# Patient Record
Sex: Female | Born: 1960
Health system: Southern US, Community
[De-identification: ages and names within clinical notes are randomized; demographics above are authoritative.]

## PROBLEM LIST (undated history)

## (undated) DIAGNOSIS — D509 Iron deficiency anemia, unspecified: Secondary | ICD-10-CM

## (undated) DIAGNOSIS — M7072 Other bursitis of hip, left hip: Secondary | ICD-10-CM

## (undated) DIAGNOSIS — Z860100 Personal history of colon polyps, unspecified: Secondary | ICD-10-CM

## (undated) DIAGNOSIS — M1990 Unspecified osteoarthritis, unspecified site: Secondary | ICD-10-CM

## (undated) DIAGNOSIS — K209 Esophagitis, unspecified without bleeding: Secondary | ICD-10-CM

## (undated) DIAGNOSIS — I10 Essential (primary) hypertension: Secondary | ICD-10-CM

## (undated) DIAGNOSIS — E785 Hyperlipidemia, unspecified: Secondary | ICD-10-CM

## (undated) DIAGNOSIS — K649 Unspecified hemorrhoids: Secondary | ICD-10-CM

## (undated) DIAGNOSIS — I639 Cerebral infarction, unspecified: Secondary | ICD-10-CM

## (undated) DIAGNOSIS — I773 Arterial fibromuscular dysplasia: Secondary | ICD-10-CM

## (undated) DIAGNOSIS — G459 Transient cerebral ischemic attack, unspecified: Secondary | ICD-10-CM

## (undated) DIAGNOSIS — R1310 Dysphagia, unspecified: Secondary | ICD-10-CM

## (undated) DIAGNOSIS — K219 Gastro-esophageal reflux disease without esophagitis: Secondary | ICD-10-CM

## (undated) HISTORY — DX: Esophagitis, unspecified: K20.9

## (undated) HISTORY — DX: Gastro-esophageal reflux disease without esophagitis: K21.9

## (undated) HISTORY — DX: Personal history of colonic polyps: Z86.010

## (undated) HISTORY — DX: Essential (primary) hypertension: I10

## (undated) HISTORY — DX: Esophagitis, unspecified without bleeding: K20.90

## (undated) HISTORY — DX: Cerebral infarction, unspecified: I63.9

## (undated) HISTORY — DX: Dysphagia, unspecified: R13.10

## (undated) HISTORY — DX: Personal history of colon polyps, unspecified: Z86.0100

## (undated) HISTORY — PX: OTHER SURGICAL HISTORY: SHX169

## (undated) HISTORY — DX: Iron deficiency anemia, unspecified: D50.9

## (undated) HISTORY — DX: Hyperlipidemia, unspecified: E78.5

## (undated) HISTORY — DX: Unspecified osteoarthritis, unspecified site: M19.90

## (undated) HISTORY — DX: Unspecified hemorrhoids: K64.9

## (undated) HISTORY — DX: Transient cerebral ischemic attack, unspecified: G45.9

## (undated) HISTORY — DX: Other bursitis of hip, left hip: M70.72

---

## 1987-08-04 HISTORY — PX: TEMPOROMANDIBULAR JOINT SURGERY: SHX35

## 1998-08-07 ENCOUNTER — Ambulatory Visit (HOSPITAL_COMMUNITY): Admission: RE | Admit: 1998-08-07 | Discharge: 1998-08-07 | Payer: Self-pay | Admitting: Radiation Oncology

## 1998-08-30 ENCOUNTER — Ambulatory Visit (HOSPITAL_COMMUNITY): Admission: RE | Admit: 1998-08-30 | Discharge: 1998-08-30 | Payer: Self-pay | Admitting: Allergy

## 1998-09-03 ENCOUNTER — Ambulatory Visit (HOSPITAL_COMMUNITY): Admission: RE | Admit: 1998-09-03 | Discharge: 1998-09-03 | Payer: Self-pay | Admitting: Radiation Oncology

## 1999-02-28 ENCOUNTER — Ambulatory Visit (HOSPITAL_COMMUNITY): Admission: RE | Admit: 1999-02-28 | Discharge: 1999-02-28 | Payer: Self-pay | Admitting: Radiation Oncology

## 1999-07-16 ENCOUNTER — Encounter: Payer: Self-pay | Admitting: *Deleted

## 1999-07-16 ENCOUNTER — Ambulatory Visit (HOSPITAL_COMMUNITY): Admission: RE | Admit: 1999-07-16 | Discharge: 1999-07-16 | Payer: Self-pay | Admitting: *Deleted

## 1999-08-22 ENCOUNTER — Ambulatory Visit (HOSPITAL_COMMUNITY): Admission: RE | Admit: 1999-08-22 | Discharge: 1999-08-22 | Payer: Self-pay | Admitting: Orthopaedic Surgery

## 1999-08-22 ENCOUNTER — Encounter: Payer: Self-pay | Admitting: Orthopaedic Surgery

## 2000-02-10 ENCOUNTER — Ambulatory Visit (HOSPITAL_COMMUNITY): Admission: RE | Admit: 2000-02-10 | Discharge: 2000-02-10 | Payer: Self-pay | Admitting: Radiation Oncology

## 2002-04-10 ENCOUNTER — Other Ambulatory Visit: Admission: RE | Admit: 2002-04-10 | Discharge: 2002-04-10 | Payer: Self-pay | Admitting: Obstetrics and Gynecology

## 2002-05-26 ENCOUNTER — Encounter (INDEPENDENT_AMBULATORY_CARE_PROVIDER_SITE_OTHER): Payer: Self-pay

## 2002-05-26 ENCOUNTER — Ambulatory Visit (HOSPITAL_COMMUNITY): Admission: RE | Admit: 2002-05-26 | Discharge: 2002-05-26 | Payer: Self-pay | Admitting: Obstetrics and Gynecology

## 2002-08-22 ENCOUNTER — Encounter: Payer: Self-pay | Admitting: Obstetrics and Gynecology

## 2002-08-22 ENCOUNTER — Encounter: Admission: RE | Admit: 2002-08-22 | Discharge: 2002-08-22 | Payer: Self-pay | Admitting: Obstetrics and Gynecology

## 2002-09-27 ENCOUNTER — Encounter: Admission: RE | Admit: 2002-09-27 | Discharge: 2002-09-27 | Payer: Self-pay | Admitting: Emergency Medicine

## 2002-09-27 ENCOUNTER — Encounter: Payer: Self-pay | Admitting: Emergency Medicine

## 2002-10-06 ENCOUNTER — Ambulatory Visit (HOSPITAL_COMMUNITY): Admission: RE | Admit: 2002-10-06 | Discharge: 2002-10-06 | Payer: Self-pay | Admitting: Emergency Medicine

## 2002-10-09 ENCOUNTER — Ambulatory Visit (HOSPITAL_COMMUNITY): Admission: RE | Admit: 2002-10-09 | Discharge: 2002-10-09 | Payer: Self-pay | Admitting: Emergency Medicine

## 2002-10-09 ENCOUNTER — Encounter: Payer: Self-pay | Admitting: Emergency Medicine

## 2003-01-31 ENCOUNTER — Encounter: Admission: RE | Admit: 2003-01-31 | Discharge: 2003-01-31 | Payer: Self-pay | Admitting: Emergency Medicine

## 2003-01-31 ENCOUNTER — Encounter: Payer: Self-pay | Admitting: Emergency Medicine

## 2003-10-31 ENCOUNTER — Encounter: Admission: RE | Admit: 2003-10-31 | Discharge: 2003-10-31 | Payer: Self-pay | Admitting: Emergency Medicine

## 2003-11-11 ENCOUNTER — Encounter: Admission: RE | Admit: 2003-11-11 | Discharge: 2003-11-11 | Payer: Self-pay | Admitting: Emergency Medicine

## 2003-11-16 ENCOUNTER — Encounter: Admission: RE | Admit: 2003-11-16 | Discharge: 2003-11-16 | Payer: Self-pay | Admitting: Obstetrics and Gynecology

## 2003-11-22 ENCOUNTER — Other Ambulatory Visit: Admission: RE | Admit: 2003-11-22 | Discharge: 2003-11-22 | Payer: Self-pay | Admitting: Obstetrics and Gynecology

## 2004-04-17 ENCOUNTER — Ambulatory Visit (HOSPITAL_COMMUNITY): Admission: RE | Admit: 2004-04-17 | Discharge: 2004-04-17 | Payer: Self-pay | Admitting: *Deleted

## 2004-08-26 ENCOUNTER — Ambulatory Visit: Payer: Self-pay | Admitting: Gastroenterology

## 2004-09-15 ENCOUNTER — Ambulatory Visit: Payer: Self-pay | Admitting: Gastroenterology

## 2004-12-03 ENCOUNTER — Ambulatory Visit: Payer: Self-pay | Admitting: Gastroenterology

## 2004-12-11 ENCOUNTER — Encounter: Admission: RE | Admit: 2004-12-11 | Discharge: 2004-12-11 | Payer: Self-pay | Admitting: Obstetrics and Gynecology

## 2005-01-23 ENCOUNTER — Other Ambulatory Visit: Admission: RE | Admit: 2005-01-23 | Discharge: 2005-01-23 | Payer: Self-pay | Admitting: Obstetrics and Gynecology

## 2005-04-12 ENCOUNTER — Ambulatory Visit (HOSPITAL_COMMUNITY): Admission: RE | Admit: 2005-04-12 | Discharge: 2005-04-12 | Payer: Self-pay | Admitting: *Deleted

## 2005-10-17 ENCOUNTER — Ambulatory Visit (HOSPITAL_COMMUNITY): Admission: RE | Admit: 2005-10-17 | Discharge: 2005-10-17 | Payer: Self-pay | Admitting: *Deleted

## 2005-12-14 ENCOUNTER — Encounter: Admission: RE | Admit: 2005-12-14 | Discharge: 2005-12-14 | Payer: Self-pay | Admitting: Obstetrics and Gynecology

## 2006-05-14 ENCOUNTER — Other Ambulatory Visit: Admission: RE | Admit: 2006-05-14 | Discharge: 2006-05-14 | Payer: Self-pay | Admitting: Obstetrics and Gynecology

## 2006-07-29 ENCOUNTER — Ambulatory Visit (HOSPITAL_COMMUNITY): Admission: RE | Admit: 2006-07-29 | Discharge: 2006-07-29 | Payer: Self-pay | Admitting: *Deleted

## 2006-10-14 ENCOUNTER — Ambulatory Visit (HOSPITAL_COMMUNITY): Admission: RE | Admit: 2006-10-14 | Discharge: 2006-10-14 | Payer: Self-pay | Admitting: Neurology

## 2007-02-07 ENCOUNTER — Ambulatory Visit (HOSPITAL_COMMUNITY): Admission: RE | Admit: 2007-02-07 | Discharge: 2007-02-07 | Payer: Self-pay | Admitting: Neurology

## 2007-02-15 ENCOUNTER — Ambulatory Visit: Payer: Self-pay | Admitting: *Deleted

## 2007-02-17 ENCOUNTER — Ambulatory Visit: Payer: Self-pay | Admitting: *Deleted

## 2007-04-13 ENCOUNTER — Encounter: Admission: RE | Admit: 2007-04-13 | Discharge: 2007-04-13 | Payer: Self-pay | Admitting: Obstetrics and Gynecology

## 2007-07-08 ENCOUNTER — Other Ambulatory Visit: Admission: RE | Admit: 2007-07-08 | Discharge: 2007-07-08 | Payer: Self-pay | Admitting: Obstetrics and Gynecology

## 2007-08-18 ENCOUNTER — Ambulatory Visit: Payer: Self-pay | Admitting: *Deleted

## 2007-09-27 ENCOUNTER — Emergency Department (HOSPITAL_COMMUNITY): Admission: EM | Admit: 2007-09-27 | Discharge: 2007-09-27 | Payer: Self-pay | Admitting: Emergency Medicine

## 2008-02-22 ENCOUNTER — Ambulatory Visit (HOSPITAL_COMMUNITY): Admission: RE | Admit: 2008-02-22 | Discharge: 2008-02-22 | Payer: Self-pay | Admitting: *Deleted

## 2008-02-23 ENCOUNTER — Ambulatory Visit: Payer: Self-pay | Admitting: *Deleted

## 2008-07-10 ENCOUNTER — Encounter: Admission: RE | Admit: 2008-07-10 | Discharge: 2008-07-10 | Payer: Self-pay | Admitting: Obstetrics and Gynecology

## 2008-08-02 ENCOUNTER — Ambulatory Visit (HOSPITAL_COMMUNITY): Admission: RE | Admit: 2008-08-02 | Discharge: 2008-08-02 | Payer: Self-pay | Admitting: *Deleted

## 2008-08-09 ENCOUNTER — Ambulatory Visit: Payer: Self-pay | Admitting: *Deleted

## 2009-02-26 ENCOUNTER — Ambulatory Visit (HOSPITAL_COMMUNITY): Admission: RE | Admit: 2009-02-26 | Discharge: 2009-02-26 | Payer: Self-pay | Admitting: *Deleted

## 2009-02-28 ENCOUNTER — Ambulatory Visit: Payer: Self-pay | Admitting: *Deleted

## 2009-08-14 ENCOUNTER — Encounter (INDEPENDENT_AMBULATORY_CARE_PROVIDER_SITE_OTHER): Payer: Self-pay | Admitting: *Deleted

## 2009-11-11 ENCOUNTER — Ambulatory Visit: Payer: Self-pay | Admitting: Gastroenterology

## 2009-11-11 ENCOUNTER — Encounter (INDEPENDENT_AMBULATORY_CARE_PROVIDER_SITE_OTHER): Payer: Self-pay | Admitting: *Deleted

## 2009-11-11 DIAGNOSIS — K219 Gastro-esophageal reflux disease without esophagitis: Secondary | ICD-10-CM

## 2009-11-11 DIAGNOSIS — Z860100 Personal history of colon polyps, unspecified: Secondary | ICD-10-CM | POA: Insufficient documentation

## 2009-11-11 DIAGNOSIS — R1319 Other dysphagia: Secondary | ICD-10-CM

## 2009-11-12 LAB — CONVERTED CEMR LAB
Eosinophils Relative: 1.1 % (ref 0.0–5.0)
Ferritin: 4.6 ng/mL — ABNORMAL LOW (ref 10.0–291.0)
HCT: 26.1 % — ABNORMAL LOW (ref 36.0–46.0)
Iron: 10 ug/dL — ABNORMAL LOW (ref 42–145)
Lymphs Abs: 1.3 10*3/uL (ref 0.7–4.0)
MCV: 65.3 fL — ABNORMAL LOW (ref 78.0–100.0)
Monocytes Relative: 8.1 % (ref 3.0–12.0)
Neutro Abs: 4.2 10*3/uL (ref 1.4–7.7)
Neutrophils Relative %: 69.1 % (ref 43.0–77.0)
RBC: 4 M/uL (ref 3.87–5.11)
RDW: 23.1 % — ABNORMAL HIGH (ref 11.5–14.6)
Saturation Ratios: 2 % — ABNORMAL LOW (ref 20.0–50.0)
Transferrin: 361.6 mg/dL — ABNORMAL HIGH (ref 212.0–360.0)
Vitamin B-12: 596 pg/mL (ref 211–911)
WBC: 6.2 10*3/uL (ref 4.5–10.5)

## 2010-01-03 ENCOUNTER — Ambulatory Visit: Payer: Self-pay | Admitting: Gastroenterology

## 2010-02-26 ENCOUNTER — Ambulatory Visit (HOSPITAL_COMMUNITY): Admission: RE | Admit: 2010-02-26 | Discharge: 2010-02-26 | Payer: Self-pay | Admitting: Surgery

## 2010-03-03 ENCOUNTER — Ambulatory Visit: Payer: Self-pay | Admitting: Surgery

## 2010-03-19 ENCOUNTER — Ambulatory Visit (HOSPITAL_COMMUNITY): Admission: RE | Admit: 2010-03-19 | Discharge: 2010-03-19 | Payer: Self-pay | Admitting: Neurology

## 2010-03-21 ENCOUNTER — Encounter: Admission: RE | Admit: 2010-03-21 | Discharge: 2010-03-21 | Payer: Self-pay | Admitting: Obstetrics and Gynecology

## 2010-04-15 ENCOUNTER — Ambulatory Visit: Payer: Self-pay | Admitting: Internal Medicine

## 2010-04-15 DIAGNOSIS — M76899 Other specified enthesopathies of unspecified lower limb, excluding foot: Secondary | ICD-10-CM

## 2010-08-24 ENCOUNTER — Encounter: Payer: Self-pay | Admitting: *Deleted

## 2010-08-25 ENCOUNTER — Encounter: Payer: Self-pay | Admitting: *Deleted

## 2010-09-02 NOTE — Procedures (Signed)
Summary: Colonoscopy   Colonoscopy  Procedure date:  09/15/2004  Findings:      Results: Hemorrhoids.     Pathology:  Adenomatous polyp.        Location:  [facility].    Procedures Next Due Date:    Colonoscopy: 09/2009 Patient Name: Cheryl [name], Cheryl [name] MRN:  Procedure Procedures: Colonoscopy CPT: [address].    with polypectomy. CPT: A3573898.  Personnel: Endoscopist: [name]. Russella Dar, MD, Clementeen Graham.  Referred By: Sylvester Harder, MD.  Exam Location: Exam performed in Outpatient Clinic. Outpatient  Patient Consent: Procedure, Alternatives, Risks and Benefits discussed, consent obtained, from patient. Consent was obtained by the RN.  Indications  Evaluation of: Anemia with low iron saturation.  History  Current Medications: Patient is not currently taking Coumadin.  Pre-Exam Physical: Performed Sep 15, 2004. Entire physical exam was normal.  Exam Exam: Extent of exam reached: Terminal Ileum, extent intended: Terminal Ileum.  The cecum was identified by appendiceal orifice and IC valve. Colon retroflexion performed. ASA Classification: II. Tolerance: excellent.  Monitoring: Pulse and BP monitoring, Oximetry used. Supplemental O2 given.  Colon Prep Used Miralax for colon prep. Prep results: good.  Sedation Meds: Patient assessed and found to be appropriate for moderate (conscious) sedation. Fentanyl 75 mcg. given IV. Versed 8 mg. given IV.  Findings NORMAL EXAM: Terminal Ileum to Sigmoid Colon.  POLYP: Rectum, Maximum size: 3 mm. sessile polyp. Procedure:  snare without cautery, removed, retrieved, Polyp sent to pathology. ICD9: Colon Polyps: 211.3.   Assessment  Diagnoses: 211.3: Colon Polyps.  455.3: Hemorrhoids, External.   Events  Unplanned Interventions: No intervention was required.  Unplanned Events: There were no complications. Plans Medication Plan: Continue current medications.  Patient Education: Patient given standard instructions  for: Polyps. Hemorrhoids.  Disposition: After procedure patient sent to recovery. After recovery patient sent home.  Scheduling/Referral: Colonoscopy, to [facility] T. Russella Dar, MD, [facility], if polyp is adenomatous, around Sep 15, 2009.  Referring provider, to Sylvester Harder, MD,    This report was created from the original endoscopy report, which was reviewed and signed by the above listed endoscopist.    cc: Sylvester Harder, MD

## 2010-09-02 NOTE — Letter (Signed)
Summary: Colonoscopy Letter  [facility]  [address] [place], [place] [address]   Phone: [phone]  Fax: [phone]      August 14, 2009 MRN: [id number]   [facility] [address] [place], [place]  [address]   Dear Ms. Kithcart,   According to your medical record, it is time for you to schedule a Colonoscopy. The American Cancer Society recommends this procedure as a method to detect early colon cancer. Patients with a family history of colon cancer, or a personal history of colon polyps or inflammatory bowel disease are at increased risk.  This letter has beeen generated based on the recommendations made at the time of your procedure. If you feel that in your particular situation this may no longer apply, please contact our office.  Please call our office at [phone] to schedule this appointment or to update your records at your earliest convenience.  Thank you for cooperating with [place] to provide you with the very best care possible.   Sincerely,  Judie Petit T. Russella Dar, M.D.  [facility] [phone]

## 2010-09-02 NOTE — Assessment & Plan Note (Signed)
Summary: GERD/RECALL COLON   History of Present Illness Visit Type: Initial Visit Primary GI MD: Elie Goody MD [facility] Primary Provider: None Chief Complaint: Patient c/o worsening GERD with increased bloating and fullness. She also c/o dysphagia at times. History of Present Illness:   This is a 50 year old female, who relates worsening problems with reflux, globus sensation, epigastric bloating and fullness, and intermittent solid food dysphagia. She takes Nexium either once or twice a day depending on her symptoms. She is concerned that taking Nexium twice a day is exacerbating her restless leg syndrome, and possibly leading to B12 deficiency.   GI Review of Systems    Reports acid reflux, bloating, chest pain, dysphagia with solids, heartburn, loss of appetite, and  nausea.      Denies abdominal pain, belching, dysphagia with liquids, vomiting, vomiting blood, weight loss, and  weight gain.      Reports hemorrhoids.     Denies anal fissure, black tarry stools, change in bowel habit, constipation, diarrhea, diverticulosis, fecal incontinence, heme positive stool, irritable bowel syndrome, jaundice, light color stool, liver problems, rectal bleeding, and  rectal pain. Preventive Screening-Counseling & Management  Caffeine-Diet-Exercise     Does Patient Exercise: no   Current Medications (verified): 1)  Nexium 40 Mg Cpdr (Esomeprazole Magnesium) .... Take 1 Tablet Once Daily Up To 2 Times Daily 2)  Aspirin 81 Mg Tbec (Aspirin) .... Take 1 Tablet By Mouth Once A Day 3)  Zyrtec Allergy 10 Mg Tabs (Cetirizine Hcl) .... Take 1 Tablet By Mouth As Needed 4)  Advil Allergy Sinus 2-30-200 Mg Tabs (Chlorpheniramine-Pse-Ibuprofen) .... Take As Needed For Sinuses  Allergies (verified): 1)  ! Sulfa 2)  ! Ancef  Past History:  Past Medical History: Reviewed history from 11/06/2009 and no changes required. 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  Alcohol Use - no Illicit Drug Use - no Patient does not get regular exercise.  Does Patient Exercise:  no  Review of Systems       The patient complains of allergy/sinus, arthritis/joint pain, heart rhythm changes, and menstrual pain.         The pertinent positives and negatives are noted as above and in the HPI. All other ROS were reviewed and were negative.   Vital Signs:  Patient profile:   50 year old female Height:      60 inches Weight:      106.13 pounds BMI:     20.80 BSA:     1.43 Pulse rate:   92 / minute Pulse rhythm:   regular BP sitting:   120 / 88  (left arm)  Vitals Entered By: Hortense Ramal CMA Duncan Dull) (November 11, 2009 9:27 AM)  Physical Exam  General:  Well developed, well nourished, no acute distress. Head:  Normocephalic and atraumatic. Eyes:  PERRLA, no icterus. Ears:  Normal auditory acuity. Mouth:  No deformity or lesions, dentition normal. Neck:  Supple; no masses or thyromegaly. Chest Wall:  Symmetrical;  no deformities or tenderness. Lungs:  Clear throughout to auscultation. Heart:  Regular rate and rhythm; no murmurs, rubs,  or bruits. Abdomen:  Soft, nontender and  nondistended. No masses, hepatosplenomegaly or hernias noted. Normal bowel sounds. Rectal:  Normal exam. Msk:  Symmetrical with no gross deformities. Normal posture. Pulses:  Normal pulses noted. Extremities:  No clubbing, cyanosis, edema or deformities noted. Neurologic:  Alert  and  oriented x4;  grossly normal neurologically. Skin:  Intact without significant lesions or rashes. Cervical Nodes:  No significant cervical adenopathy. Axillary Nodes:  No significant axillary adenopathy. Inguinal Nodes:  No significant inguinal adenopathy. Psych:  Alert and cooperative. Normal mood and affect.  Impression & Recommendations:  Problem # 1:  GERD (ICD-530.81) GERD with solid food dysphasia. Rule out peptic strictures and esophagitis. Change to Dexilant 60 mg q.a.m. Evaluate for recurrent anemia given restless leg syndrome, and prior history of iron deficiency. Orders: TLB-B12, Serum-Total ONLY (82607-B12) TLB-Iron, (Fe) Total (83540-FE) TLB-Ferritin (82728-FER) TLB-IBC Pnl (Iron/FE;Transferrin) (83550-IBC) TLB-CBC Platelet - w/Differential (85025-CBCD) Endo Savary  Dil/Colon (Endo Sav Dil/Col)  Problem # 2:  OTHER DYSPHAGIA (ZOX-096.04) See problem #1.  Problem # 3:  COLONIC POLYPS, ADENOMATOUS, HX OF (ICD-V12.72) Prior adenomatous colon polyps, initially diagnosed in February 2006. She is due for surveillance. The risks, benefits and alternatives to colonoscopy with possible biopsy and possible polypectomy were discussed with the patient and they consent to proceed. The procedure will be scheduled electively. Orders: Endo Savary  Dil/Colon (Endo Sav Dil/Col)  Patient Instructions: 1)  Get your labs drawn today in the basement.  2)  Colonoscopy brochure given.  3)  Upper Endoscopy brochure given.  4)  Start Dexilant 60mg  samples one tablet by mouth once daily and HOLD Nexium while taking this medication. 5)  Avoid foods high in acid content ( tomatoes, citrus juices, spicy foods) . Avoid eating within 3 to 4 hours of lying down or before exercising. Do not over eat; try smaller more frequent meals. Elevate head of bed four inches when sleeping.  6)  The medication list was reviewed and reconciled.  All changed / newly prescribed medications were explained.  A  complete medication list was provided to the patient / caregiver.  Prescriptions: MOVIPREP 100 GM  SOLR (PEG-KCL-NACL-NASULF-NA ASC-C) As per prep instructions.  #1 x 0   Entered by:   Christie Nottingham CMA (AAMA)   Authorized by:   Meryl Dare MD [facility]   Signed by:   Meryl Dare MD FACG on 11/11/2009   Method used:   Electronically to        [facility] [phone]* (retail)       [address]       [place], [place]  [id number]       Ph: [phone] or [phone]       Fax: [phone]   RxID:   [id number]

## 2010-09-02 NOTE — Procedures (Signed)
Summary: Upper Endoscopy w/DIL  Patient: Cheryl [name] Note: All result statuses are Final unless otherwise noted.  Tests: (1) Upper Endoscopy w/DIL (UED)  UED Upper Endoscopy w/DIL                             DONE     [facility]     [address]     [place], [place]  [address]           ENDOSCOPY PROCEDURE REPORT           PATIENT:  Cheryl, [name]  MR#:  [id number]     BIRTHDATE:  [birth year], 48 yrs. old  GENDER:  female     ENDOSCOPIST:  Judie Petit T. Russella Dar, MD, [facility]           PROCEDURE DATE:  01/03/2010     PROCEDURE:  EGD with dilatation over guidewire     ASA CLASS:  Class I     INDICATIONS:  1) dysphagia  2) GERD     MEDICATIONS:  There was residual sedation effect present from     prior procedure.     TOPICAL ANESTHETIC:  none     DESCRIPTION OF PROCEDURE:   After the risks benefits and     alternatives of the procedure were thoroughly explained, informed     consent was obtained.  The [id number]-[id number] [id number] endoscope was     introduced through the mouth and advanced to the second portion of     the duodenum, without limitations.  The instrument was slowly     withdrawn as the mucosa was carefully examined.     <<PROCEDUREIMAGES>>     The esophagus and gastroesophageal junction were completely normal     in appearance.  The stomach was entered and closely examined. The     pylorus, antrum, angularis, and lesser curvature were well     visualized, including a retroflexed view of the cardia and fundus.     The stomach wall was normally distensable. The scope passed easily     through the pylorus into the duodenum. The duodenal bulb was     normal in appearance, as was the postbulbar duodenum. Dilation was     then performed at the total esophagus for dysphagia without a     stricture:           1) Dilator:  Savary over guidewire  Size:  17 mm     Resistance:  none  Heme:  none           COMPLICATIONS:  None           ENDOSCOPIC IMPRESSION:     1) Normal  EGD           RECOMMENDATIONS:     1) anti-reflux regimen     2) continue PPI     3) follow-up: GI clinic 1 year     4) post dilation instructions           Shaakira Borrero T. Russella Dar, MD, [facility]           n.     eSIGNED:   [name]. Luverne Zerkle at 01/03/2010 04:42 PM           [name], [id number]  Note: An exclamation mark (!) indicates a result that was not dispersed into the flowsheet. Document Creation Date: 01/03/2010 4:42 PM _______________________________________________________________________  (1) Order result status: Final Collection or observation date-time:  01/03/2010 16:39 Requested date-time:  Receipt date-time:  Reported date-time:  Referring Physician:   Ordering Physician: Claudette Head [phone]) Specimen Source:  Source: Launa Grill Order Number: [id number] Lab site:

## 2010-09-02 NOTE — Assessment & Plan Note (Signed)
Summary: Gastroenterology  FRANCE LUSTY MR#:  [id number] Page #  NAME:  Cheryl, [name]  OFFICE NO:  [id number]  DATE:  08/26/04  DOB:  07/21/2061  REFERRING PHYSICIAN:  Dr. Sylvester Harder  REASON FOR REFERRAL:  Refractory iron-deficiency anemia.  HISTORY OF PRESENT ILLNESS:  The patient is a very nice 50 year old African American female with a history of iron-deficiency anemia dating back to September 2003. She has been on and off iron supplements since that time. Recent hemoglobin was 9 with a serum iron of 12 and a total iron binding capacity of 459. She has regular heavy menstrual periods, which are felt to be the etiology of her refractory iron-deficiency anemia. Hysterectomy is under consideration at this time.   She has had intermittent problems with reflux symptoms and regurgitation. She has undergone 2 upper GI series, 1 was in about 2000 that she relates was negative, and 1 was performed in June 2004, which I was able to obtain and which was entirely normal. She has had problems with chronic constipation, but she eats raw oatmeal, and this has substantially helped her chronic constipation. She does have a substantial craving for raw oatmeal ever since she has become iron deficient. She has no dysphagia, odynophagia, anorexia, weight loss, nausea, vomiting, abdominal pain, diarrhea, constipation, melena, hematochezia, change in stool caliber, fevers, or chills. There is no family history of colon cancer, colon polyps, or inflammatory bowel disease. She does note occasional perianal itching and stinging with a slight bulging sensation that she relates to a hemorrhoid. She has taken Mylanta Gas on a p.r.n. basis, which does help when she has a flare of her reflux symptoms. She has not previously had colonoscopy or upper endoscopy.  PAST MEDICAL HISTORY:  Hyperlipidemia, allergies, iron-deficiency anemia, metromenorrhagia, gastroesophageal reflux disease.  SOCIAL HISTORY:  She is married with 2  children. 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 She denies any tobacco product or alcohol product usage.  CURRENT MEDICATIONS:  Nexium 40 mg p.o. q.d., Feosol q.d., multivitamin q.d., calcium 600 plus vitamin D q.d., Flonase p.r.n., decongestant p.r.n., Advil p.r.n., Reglan p.r.n.   MEDICATION ALLERGIES:  Sulfa drugs and Ancef.  REVIEW OF SYSTEMS:  Several areas positive as per the handwritten form including a history of a tickle in her throat and occasional mild hoarseness and voice fatigue.  PHYSICAL EXAMINATION:  No acute distress. Height 5 feet 3/4 inch. Weight 109 pounds. Blood pressure 132/70. Pulse 74 and regular. HEENT exam:  Anicteric sclerae; oropharynx clear.  Chest:  Clear to auscultation and percussion.  Cardiac:  Regular rate and rhythm without murmurs.  Abdomen:  Soft, nontender, and nondistended with normoactive bowel sounds; no palpable organomegaly, masses, or hernias.  Rectal examination deferred to time of colonoscopy.  Extremities:  Without clubbing, cyanosis, or edema.  Neurologic:  Alert and oriented x 3. Grossly nonfocal.  ASSESSMENT AND PLAN:   1.  Refractory iron-deficiency anemia. Rule out occult gastrointestinal losses from polyps, vascular malformations, ulcers, and other disorders. Her refractory anemia is most likely secondary to her metromenorrhagia. Will obtain stool Hemoccults. Risks, benefits, and alternatives to colonoscopy with possible biopsy and possible polypectomy and upper endoscopy with possible biopsy were discussed with the patient, and she consents to proceed. The procedures will be scheduled electively. 2.  Gastroesophageal reflux disease. Reasonably good symptomatic control. However, she does have some symptoms suggestive of laryngopharyngeal reflux. These symptoms may also be related to her allergies. Will increase Nexium to 40 mg p.o. b.i.d.  and re-intensify all standard antireflux measures. The Nexium is to be taken 30 to 45  minutes before breakfast and dinner.   Return office visit with me in 3 to 4 months.      [name]. Russella Dar, M.D., F.A.C.G.  [id number] cc:  Dr. Sylvester Harder D:  08/26/04; T:  ; Job [id number]

## 2010-09-02 NOTE — Letter (Signed)
Summary: [facility] Instructions  [facility]  [address] [place], [place] [address]   Phone: [phone]  Fax: [phone]       Cheryl [name]    [birth year]    MRN: [id number]        Procedure Day /Date: Friday May 13th, 2011     Arrival Time: 1:00pm     Procedure Time: 2:00pm     Location of Procedure:                    _x _  [facility] (4th Floor)                        PREPARATION FOR COLONOSCOPY WITH MOVIPREP   Starting 5 days prior to your procedure 12/08/09 do not eat nuts, seeds, popcorn, corn, beans, peas,  salads, or any raw vegetables.  Do not take any fiber supplements (e.g. Metamucil, Citrucel, and Benefiber).  THE DAY BEFORE YOUR PROCEDURE         DATE: 12/12/09  DAY: Thursday  1.  Drink clear liquids the entire day-NO SOLID FOOD  2.  Do not drink anything colored red or purple.  Avoid juices with pulp.  No orange juice.  3.  Drink at least 64 oz. (8 glasses) of fluid/clear liquids during the day to prevent dehydration and help the prep work efficiently.  CLEAR LIQUIDS INCLUDE: Water Jello Ice Popsicles Tea (sugar ok, no milk/cream) Powdered fruit flavored drinks Coffee (sugar ok, no milk/cream) Gatorade Juice: apple, white grape, white cranberry  Lemonade Clear bullion, consomm, broth Carbonated beverages (any kind) Strained chicken noodle soup Hard Candy                             4.  In the morning, mix first dose of MoviPrep solution:    Empty 1 Pouch A and 1 Pouch B into the disposable container    Add lukewarm drinking water to the top line of the container. Mix to dissolve    Refrigerate (mixed solution should be used within 24 hrs)  5.  Begin drinking the prep at 5:00 p.m. The MoviPrep container is divided by 4 marks.   Every 15 minutes drink the solution down to the next mark (approximately 8 oz) until the full liter is complete.   6.  Follow completed prep with 16 oz of clear liquid of your choice  (Nothing red or purple).  Continue to drink clear liquids until bedtime.  7.  Before going to bed, mix second dose of MoviPrep solution:    Empty 1 Pouch A and 1 Pouch B into the disposable container    Add lukewarm drinking water to the top line of the container. Mix to dissolve    Refrigerate  THE DAY OF YOUR PROCEDURE      DATE: 12/13/09 DAY: Friday  Beginning at 9:00 a.m. (5 hours before procedure):         1. Every 15 minutes, drink the solution down to the next mark (approx 8 oz) until the full liter is complete.  2. Follow completed prep with 16 oz. of clear liquid of your choice.    3. You may drink clear liquids until 12:00 (2 HOURS BEFORE PROCEDURE).   MEDICATION INSTRUCTIONS  Unless otherwise instructed, you should take regular prescription medications with a small sip of water   as early as possible the morning of your  procedure.          OTHER INSTRUCTIONS  You will need a responsible adult at least 51 years of age to accompany you and drive you home.   This person must remain in the waiting room during your procedure.  Wear loose fitting clothing that is easily removed.  Leave jewelry and other valuables at home.  However, you may wish to bring a book to read or  an iPod/MP3 player to listen to music as you wait for your procedure to start.  Remove all body piercing jewelry and leave at home.  Total time from sign-in until discharge is approximately 2-3 hours.  You should go home directly after your procedure and rest.  You can resume normal activities the  day after your procedure.  The day of your procedure you should not:   Drive   Make legal decisions   Operate machinery   Drink alcohol   Return to work  You will receive specific instructions about eating, activities and medications before you leave.    The above instructions have been reviewed and explained to me by   Marchelle Folks.     I fully understand and can verbalize these  instructions _____________________________ Date _________

## 2010-09-02 NOTE — Procedures (Signed)
Summary: Colonoscopy  Patient: Cheryl [name] Note: All result statuses are Final unless otherwise noted.  Tests: (1) Colonoscopy (COL)   COL Colonoscopy           DONE (C)     [facility]     [address]     [place], [place]  [address]           COLONOSCOPY PROCEDURE REPORT           PATIENT:  [name], [name]  MR#:  [id number]     BIRTHDATE:  07/26/1961, 48 yrs. old  GENDER:  female     ENDOSCOPIST:  Judie Petit T. Russella Dar, MD, [facility]           PROCEDURE DATE:  01/03/2010     PROCEDURE:  Colonoscopy 62[address]     ASA CLASS:  Class I     INDICATIONS:  1) follow-up of polyp, adenomatous polyp, 09/2004.     High risk screening and surveillance.     MEDICATIONS:   Fentanyl 75 mcg IV, Versed 8 mg IV     DESCRIPTION OF PROCEDURE:   After the risks benefits and     alternatives of the procedure were thoroughly explained, informed     consent was obtained.  Digital rectal exam was performed and     revealed no abnormalities.   The LB [id number] endoscope     was introduced through the anus and advanced to the cecum, which     was identified by both the appendix and ileocecal valve, without     limitations.  The quality of the prep was excellent, using     MoviPrep.  The instrument was then slowly withdrawn as the colon     was fully examined.     <<PROCEDUREIMAGES>>     FINDINGS:  Scattered diverticula were found in the transverse     colon.  A normal appearing cecum, ileocecal valve, and appendiceal     orifice were identified. The ascending, hepatic flexure, splenic     flexure, descending, sigmoid colon, and rectum appeared     unremarkable.  Retroflexed views in the rectum revealed no     abnormalities.  The time to cecum =  6.5  minutes. The scope was     then withdrawn (time =  10  min) from the patient and the     procedure completed.     COMPLICATIONS:  None           ENDOSCOPIC IMPRESSION:     1) Diverticula, scattered in the transverse colon      RECOMMENDATIONS:     1) High fiber diet with liberal fluid intake.     2) Repeat Colonoscopy in 5 years.           [name]. Russella Dar, MD, [facility]           n.     REVISED:  01/03/2010 04:44 PM     eSIGNED:   [name]. [name] at 01/03/2010 04:44 PM           [name], [id number]  Note: An exclamation mark (!) indicates a result that was not dispersed into the flowsheet. Document Creation Date: 01/03/2010 4:44 PM _______________________________________________________________________  (1) Order result status: Final Collection or observation date-time: 01/03/2010 16:30 Requested date-time:  Receipt date-time:  Reported date-time:  Referring Physician:   Ordering Physician: Claudette Head [phone]) Specimen Source:  Source: Launa Grill Order Number: [id number] Lab site:   Appended Document: Colonoscopy  Clinical Lists Changes  Observations: Added new observation of COLONNXTDUE: 01/2015 (01/03/2010 17:00)

## 2010-09-02 NOTE — Procedures (Signed)
Summary: EGD   EGD  Procedure date:  09/15/2004  Findings:      Findings: Esophagitis  Location: [facility]   Patient Name: Cheryl [name], Cheryl [name]. MRN:  Procedure Procedures: Panendoscopy (EGD) CPT: 43235.    with biopsy(s)/brushing(s). CPT: D1846139.  Personnel: Endoscopist: [name]. Russella Dar, MD, Clementeen Graham.  Referred By: Sylvester Harder, MD.  Exam Location: Exam performed in Outpatient Clinic. Outpatient  Patient Consent: Procedure, Alternatives, Risks and Benefits discussed, consent obtained, from patient. Consent was obtained by the RN.  Indications  Evaluation of: Anemia,  with low iron saturation.  Symptoms: Reflux symptoms  History  Current Medications: Patient is not currently taking Coumadin.  Pre-Exam Physical: Performed Sep 15, 2004  Entire physical exam was normal.  Exam Exam Info: Maximum depth of insertion Duodenum, intended Duodenum. Vocal cords not visualized. Gastric retroflexion performed. ASA Classification: II. Tolerance: excellent.  Sedation Meds: Patient assessed and found to be appropriate for moderate (conscious) sedation. Residual sedation present from prior procedure today. Cetacaine Spray 2 sprays given aerosolized.  Monitoring: BP and pulse monitoring done. Oximetry used. Supplemental O2 given  Findings Normal: Proximal Esophagus to Mid Esophagus.  ESOPHAGEAL INFLAMMATION: Severity is mild, erythema only.  [place] Classification: Grade 0. ICD9: Esophagitis, Reflux: 530.11.  Normal: Cardia to Duodenal Bulb.  Normal: Duodenal 2nd Portion. Biopsy/Normal taken. Comments: R/O celiac disease.   Assessment  Diagnoses: 530.11: Esophagitis, Reflux.   Events  Unplanned Intervention: No unplanned interventions were required.  Unplanned Events: There were no complications. Plans Medication(s): Await pathology. Continue current medications. PPI: Esomeprazole/Nexium 40 mg BID,  Other: Chromagen BID,   Patient  Education: Patient given standard instructions for: Reflux.  Disposition: After procedure patient sent to recovery. After recovery patient sent home.  Scheduling: Referring provider, to Sylvester Harder, MD,  Office Visit, to [name]. Russella Dar, MD, [facility], around Nov 13, 2004.

## 2010-09-02 NOTE — Assessment & Plan Note (Signed)
Summary: NEW PT/TO EST/CJR   Vital Signs:  Patient profile:   50 year old female Height:      60.5 inches Weight:      105 pounds Temp:     98.5 degrees F oral Pulse rate:   80 / minute Pulse rhythm:   regular Resp:     16 per minute BP sitting:   110 / 70  (right arm) Cuff size:   regular  Vitals Entered By: Duard Brady LPN (April 15, 2010 9:26 AM) CC: new to establish - doing well Is Patient Diabetic? No   Primary Care Provider:  None  CC:  new to establish - doing well.  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  She is followed closely by GI.  Due to reflux and stricture formation.  She also has a history of colonic polyps.  Two status post colonoscopy in 2005 and earlier this year.  She is followed by neurology for fibromuscular dysplasia involving the carotid arteries.  She has a history of iron deficiency anemia  Preventive Screening-Counseling & Management  Alcohol-Tobacco     Smoking Status: never  Allergies: 1)  ! Sulfa 2)  ! Ancef  Past History:  Past Medical History: Iron-deficiency anemia Hyperlipidemia Allergies Metromenorrhagia GERD/ Stricture Esophagitis Hemorrhoids Adenomatous Colon Polyps 09/2004 Arthritis FMD carotid arteries  cervical and lumbar disk disease and  Past Surgical History: Nasoseptoplasty  TMJ C-section x 2 colonoscopy 2005, 2010  Family History: Reviewed history from 11/11/2009 and no changes required. Family History of Prostate Cancer: Father,laryngeal ca, prostate cancer Family History of Diabetes: Maternal Aunt and Grandmother Family History of Heart Disease: Paternal Grandmother No FH of Colon Cancer: Family History of Laryngeal Cancer: Father (primary) positive for hypertension, and dyslipidemia-mother identical twin 2 brothers  Social History: Reviewed history from 11/11/2009 and no changes required. 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  Alcohol Use - no Illicit Drug Use - no Patient does not get regular exercise.   two children, age 51, and 35  Review of Systems  The patient denies anorexia, fever, weight loss, weight gain, vision loss, decreased hearing, hoarseness, chest pain, syncope, dyspnea on exertion, peripheral edema, prolonged cough, headaches, hemoptysis, abdominal pain, melena, hematochezia, severe indigestion/heartburn, hematuria, incontinence, genital sores, muscle weakness, suspicious skin lesions, transient blindness, difficulty walking, depression, unusual weight change, abnormal bleeding, enlarged lymph nodes, angioedema, and breast masses.         two-month history of left hip pain  Physical Exam  General:  Well-developed,well-nourished,in no acute distress; alert,appropriate and cooperative throughout examination Head:  Normocephalic and atraumatic without obvious abnormalities. No apparent alopecia or balding. Eyes:  No corneal or conjunctival inflammation noted. EOMI. Perrla. Funduscopic exam benign, without hemorrhages, exudates or papilledema. Vision grossly normal. Ears:  External ear exam shows no significant lesions or deformities.  Otoscopic examination reveals clear canals, tympanic membranes are intact bilaterally without bulging, retraction, inflammation or discharge. Hearing is grossly normal bilaterally. Mouth:  Oral mucosa and oropharynx without lesions or exudates.  Teeth in good repair. Neck:  No deformities, masses, or tenderness noted. bilateral bruits Chest Wall:  No deformities, masses, or tenderness noted. Lungs:  Normal respiratory effort, chest expands symmetrically. Lungs are clear to auscultation, no crackles or wheezes. Heart:  Normal rate and regular rhythm. S1 and S2 normal without gallop, murmur, click, rub or other extra sounds. Abdomen:  Bowel sounds positive,abdomen soft and non-tender without masses, organomegaly or hernias noted.  Msk:  No deformity or  scoliosis noted of thoracic or lumbar spine.   Pulses:  R and L carotid,radial,femoral,dorsalis pedis and posterior tibial pulses are full and equal bilaterally Extremities:  No clubbing, cyanosis, edema, or deformity noted with normal full range of motion of all joints.   Neurologic:  No cranial nerve deficits noted. Station and gait are normal. Plantar reflexes are down-going bilaterally. DTRs are symmetrical throughout. Sensory, motor and coordinative functions appear intact. Skin:  Intact without suspicious lesions or rashes Cervical Nodes:  No lymphadenopathy noted Axillary Nodes:  No palpable lymphadenopathy Inguinal Nodes:  No significant adenopathy Psych:  Cognition and judgment appear intact. Alert and cooperative with normal attention span and concentration. No apparent delusions, illusions, hallucinations   Impression & Recommendations:  Problem # 1:  Preventive Health Care (ICD-V70.0)  Complete Medication List: 1)  Aspirin 81 Mg Tbec (Aspirin) .... Take 1 tablet by mouth once a day 2)  Zyrtec Allergy 10 Mg Tabs (Cetirizine hcl) .... Take 1 tablet by mouth as needed 3)  Advil Allergy Sinus 2-30-200 Mg Tabs (Chlorpheniramine-pse-ibuprofen) .... Take as needed for sinuses 4)  Dexilant 60 Mg Cpdr (Dexlansoprazole) .... Take one every a.m. 5)  Fluconazole 150 Mg Tabs (Fluconazole) .... One-time dose  Patient Instructions: 1)  It is important that you exercise regularly at least 20 minutes 5 times a week. If you develop chest pain, have severe difficulty breathing, or feel very tired , stop exercising immediately and seek medical attention. 2)  Take calcium +Vitamin D daily. Prescriptions: FLUCONAZOLE 150 MG TABS (FLUCONAZOLE) one-time dose  #1 x 2   Entered and Authorized by:   Gordy Savers  MD   Signed by:   Gordy Savers  MD on 04/15/2010   Method used:   [facility]        [facility] [address] [phone]* (retail)       [address]       [place], [place]  [address]       Ph: [phone] or [phone]       Fax: [phone]   RxID:   [id number] DEXILANT 60 MG CPDR (DEXLANSOPRAZOLE) take one every a.m.  #30 x 11   Entered and Authorized by:   Gordy Savers  MD   Signed by:   Gordy Savers  MD on 04/15/2010   Method used:   [facility]        [facility] [address] [facility] [address] [facility] [address] [phone]* (retail)       [address]       [place], [place]  [address]       Ph: [phone] or [phone]       Fax: [phone]   RxID:   [id number]   Appended Document: NEW PT/TO EST/CJR     Allergies: 1)  ! Sulfa 2)  ! Ancef   Complete Medication List: 1)  Aspirin 81 Mg Tbec (Aspirin) .... Take 1 tablet by mouth once a day 2)  Zyrtec Allergy 10 Mg Tabs (Cetirizine hcl) .... Take 1 tablet by mouth as needed 3)  Advil Allergy Sinus 2-30-200 Mg Tabs (Chlorpheniramine-pse-ibuprofen) .... Take as needed for sinuses 4)  Dexilant 60 Mg Cpdr (Dexlansoprazole) .... Take one every a.m. 5)  Fluconazole 150 Mg Tabs (Fluconazole) .... One-time dose  Other Orders: Depo- Medrol 40mg  (J1030) Admin of Therapeutic Inj  intramuscular or subcutaneous (62831)  Medication Administration  Injection # 1:    Medication: Depo- Medrol 40mg     Diagnosis: BURSITIS, LEFT HIP (ICD-726.5)    Route: IM    Site: LUOQ gluteus    Exp Date: 11/2012    Lot #: [id number]    Mfr: Pharmacia    Patient tolerated injection without complications    Given by: Duard Brady LPN (April 15, 2010 4:47 PM)  Orders Added: 1)  Depo- Medrol 40mg  [J1030] 2)  Admin of Therapeutic Inj  intramuscular or subcutaneous [16109]

## 2010-09-02 NOTE — Miscellaneous (Signed)
Summary: dexilant-rx  Clinical Lists Changes  Medications: Added new medication of DEXILANT 60 MG CPDR (DEXLANSOPRAZOLE) take one every a.m. - Signed Rx of DEXILANT 60 MG CPDR (DEXLANSOPRAZOLE) take one every a.m.;  #30 x 11;  Signed;  Entered by: Greer Ee RN;  Authorized by: Meryl Dare MD [facility];  Method used: [facility] [address] [phone] [address] [place], [place], [place]  [address], Ph: [phone] or [phone], Fax: [phone]    Prescriptions: DEXILANT 60 MG CPDR (DEXLANSOPRAZOLE) take one every a.m.  #30 x 11   Entered by:   Greer Ee RN   Authorized by:   Meryl Dare MD [facility]   Signed by:   Greer Ee RN on 01/03/2010   Method used:   [facility]ronically to        [facility] [address] [phone]* (retail)       [address]       [place], [place]  [address]       Ph: [phone] or [phone]       Fax: [phone]   RxID:   [id number]

## 2010-09-08 ENCOUNTER — Other Ambulatory Visit: Payer: Self-pay

## 2010-09-08 ENCOUNTER — Ambulatory Visit: Payer: Self-pay | Admitting: Surgery

## 2010-09-25 NOTE — Assessment & Plan Note (Addendum)
OFFICE VISIT  Cheryl [name], Cheryl [name] DOB:  Dec 30, 1960                                       09/08/2010 ZOXWR#:[id number]  The patient was supposed to come back today to discuss fibromuscular dysplasia in her carotid artery.  This was going to be a 6 month followup with ultrasound.  She did not show up today.    Jorge Ny, MD Electronically Signed  VWB/MEDQ  D:  09/08/2010  T:  09/09/2010  Job:  [id number]

## 2010-12-16 NOTE — Procedures (Signed)
CAROTID DUPLEX EXAM   INDICATION:  Follow up known fibromuscular dysplasia.   HISTORY:  Diabetes:  No.  Cardiac:  No.  Hypertension:  No.  Smoking:  No.  Previous Surgery:  No.  CV History:  No.  Amaurosis Fugax No, Paresthesias No, Hemiparesis No.                                       RIGHT             LEFT  Brachial systolic pressure:         136               132  Brachial Doppler waveforms:         Normal            Normal  Vertebral direction of flow:        Antegrade         Antegrade  DUPLEX VELOCITIES (cm/sec)  CCA peak systolic                   102               108  ECA peak systolic                   121               111  ICA peak systolic                   204               175  ICA end diastolic                   85                87  PLAQUE MORPHOLOGY:                  None              None  PLAQUE AMOUNT:                      None              None  PLAQUE LOCATION:                    None              None   IMPRESSION:  1. Doppler velocities suggest a 60-79% stenosis of the bilateral mid-      to-distal internal carotid arteries; however, this is most likely      secondary to the known fibromuscular dysplasia.  2. No significant change noted when compared to the previous      examination on 08/18/07.   ___________________________________________  P. Liliane Bade, M.D.   CH/MEDQ  D:  02/23/2008  T:  02/23/2008  Job:  [id number]

## 2010-12-16 NOTE — Procedures (Signed)
CAROTID DUPLEX EXAM   INDICATION:  Fibromuscular dysplasia.   HISTORY:  Diabetes:  No  Cardiac:  No  Hypertension:  No  Smoking:  No  Previous Surgery:  No  CV History:  No history of stroke  Amaurosis Fugax No, Paresthesias No, Hemiparesis No                                       RIGHT             LEFT  Brachial systolic pressure:         136               134  Brachial Doppler waveforms:         normal            normal  Vertebral direction of flow:        antegrade         antegrade  DUPLEX VELOCITIES (cm/sec)  CCA peak systolic                   82                78  ECA peak systolic                   124               101  ICA peak systolic                   210 (distal)      205 (mid)  ICA end diastolic                   89                89  PLAQUE MORPHOLOGY:  PLAQUE AMOUNT:                      none              none  PLAQUE LOCATION:   IMPRESSION:  1. Doppler velocities suggest a 60%-79% stenosis of the bilateral      internal carotid arteries; however, this increase in velocity is      most likely due to known fibromuscular dysplasia.  Plaque      formations in the internal carotid artery were not adequately      visualized.  2. No significant change noted when compared to the previous exam on      08/09/2008.   ___________________________________________  V. Charlena Cross, MD   CH/MEDQ  D:  03/04/2010  T:  03/04/2010  Job:  [id number]

## 2010-12-16 NOTE — Procedures (Signed)
CAROTID DUPLEX EXAM   INDICATION:  Followup known fibromuscular dysplasia.   HISTORY:  Diabetes:  No.  Cardiac:  No.  Hypertension:  No.  Smoking:  No.  Previous Surgery:  CV History:  Amaurosis Fugax No, Paresthesias No, Hemiparesis No                                       RIGHT             LEFT  Brachial systolic pressure:         124               124  Brachial Doppler waveforms:         Biphasic          Biphasic  Vertebral direction of flow:        Antegrade         Antegrade  DUPLEX VELOCITIES (cm/sec)  CCA peak systolic                   92                95  ECA peak systolic                   116               97  ICA peak systolic                   200 (distal)      159 (distal)  ICA end diastolic                   98                81  PLAQUE MORPHOLOGY:                  None              None  PLAQUE AMOUNT:                      None              None  PLAQUE LOCATION:                    None              None   IMPRESSION:  1. Increased velocities in distal internal carotid arteries      bilaterally are stable from previous examinations.  2. No plaque noted bilaterally.      ___________________________________________  P. Liliane Bade, M.D.   DP/MEDQ  D:  08/18/2007  T:  08/18/2007  Job:  [id number]

## 2010-12-16 NOTE — Assessment & Plan Note (Signed)
OFFICE VISIT   Cheryl [name], Cheryl [name]  DOB:  [birth year]                                       02/17/2007  ZOXWR#:[id number]   The patient returns today for continued monitoring of her fibromuscular  dysplasia affecting the carotid arteries.  She underwent an MRI  angiogram of the neck 02/07/2007.  This continued to reveal  fibromuscular dysplasia changes in her carotid arteries, and may also be  affecting her vertebrobasilar system.   No focal symptoms of stroke.  She does describe an episode recently  lasting 24 to 36 hours of primarily dizziness and unsteadiness of gait.  No speech problems.  Denies memory loss.   Blood pressure is 127/64, pulse is 94, respirations 16.  No audible  carotid bruits.  Cranial nerves intact.  Strength is equal bilaterally.   The patient has been seen by Dr. Pearlean Brownie and he is in agreement with plans  for conservative treatment.  Will continue the aspirin 325 mg daily.  Will plan to follow up with her again in 6 months with a carotid Doppler  evaluation.   Cheryl [name], M.D.  Electronically Signed   PGH/MEDQ  D:  02/17/2007  T:  02/18/2007  Job:  [id number]   cc:   Reuben Likes, M.D.  Pramod P. Pearlean Brownie, MD

## 2010-12-16 NOTE — Procedures (Signed)
CAROTID DUPLEX EXAM   INDICATION:  Followup of known fibromuscular dysplasia.   HISTORY:  Diabetes:  No.  Cardiac:  No.  Hypertension:  No.  Smoking:  No.  __________  Previous Surgery:  CV History:  Amaurosis Fugax Yes No, Paresthesias Yes No, Hemiparesis Yes No                                       RIGHT             LEFT  Brachial systolic pressure:         140.              150.  Brachial Doppler waveforms:         Biphasic          Biphasic.  Vertebral direction of flow:        Antegrade.        Antegrade.  DUPLEX VELOCITIES (cm/sec)  CCA peak systolic                   73.               77.  ECA peak systolic                   88.               87.  ICA peak systolic                   251 (distal).     160 (distal).  ICA end diastolic                   112 (distal).     74 (distal).  PLAQUE MORPHOLOGY:                  None.             None.  PLAQUE AMOUNT:                      None.             None.  PLAQUE LOCATION:                    None.             None.   IMPRESSION:  Increased velocity noted in bilateral ICA consistent with a  known fibromuscular dysplasia. Otherwise, normal carotid duplex with  bilateral vertebral arteries.   ___________________________________________  P. Liliane Bade, M.D.   MG/MEDQ  D:  02/15/2007  T:  02/16/2007  Job:  [id number]

## 2010-12-16 NOTE — Assessment & Plan Note (Signed)
OFFICE VISIT   KEMIAH, BOOZ  DOB:  1961/01/17                                       08/09/2008  ZOXWR#:[id number]   The patient returns for routine followup of fibromuscular dysplasia of  the carotid arteries.  She underwent an MR angiogram of the neck along  with carotid Doppler today.  This reveals stable if not mildly improved  changes of the right internal carotid artery with fibromuscular  dysplasia.  Doppler continues to reveal 60-79% stenosis.   The patient remains free of any symptoms.  No further difficulty with  word finding.  Denies sensory, motor or visual deficit.  No gait  abnormalities.  No syncope or presyncope.   The patient appears generally well.  Right carotid bruit.  Strength is  equal bilaterally.  Intact cranial nerves.  Blood pressure 127/85 and  pulse is 78 per minute.   The patient continues to do well without symptoms associated with FMD of  the carotid arteries.  Will plan repeat followup in 6 months with MR  angiogram.   Balinda Quails, M.D.  Electronically Signed   PGH/MEDQ  D:  08/09/2008  T:  08/10/2008  Job:  [id number]   cc:   Pramod P. Pearlean Brownie, MD  Reuben Likes, M.D.

## 2010-12-16 NOTE — Procedures (Signed)
CAROTID DUPLEX EXAM   INDICATION:  Followup known fibromuscular dysplasia.   HISTORY:  Diabetes:  No.  Cardiac:  No.  Hypertension:  No.  Smoking:  No.  Previous Surgery:  No.  CV History:  No.  Amaurosis Fugax No, Paresthesias No, Hemiparesis No                                       RIGHT             LEFT  Brachial systolic pressure:         128               124  Brachial Doppler waveforms:         Within normal limits                Within normal limits  Vertebral direction of flow:        Antegrade         Antegrade  DUPLEX VELOCITIES (cm/sec)  CCA peak systolic                   128               95  ECA peak systolic                   141               129  ICA peak systolic                   223               184  ICA end diastolic                   94                77  PLAQUE MORPHOLOGY:                  None              None  PLAQUE AMOUNT:                      None              None  PLAQUE LOCATION:                    None              None   IMPRESSION:  1. Sonographic evidence of bilateral 60-79% stenosis at the mid distal      ICA.  However, this is most likely secondary to known fibromuscular      dysplasia.   ___________________________________________  P. Liliane Bade, M.D.   AC/MEDQ  D:  08/09/2008  T:  08/09/2008  Job:  [id number]

## 2010-12-16 NOTE — Assessment & Plan Note (Signed)
OFFICE VISIT   [name], [name]  DOB:  1961/03/15                                       08/18/2007  ZOXWR#:[id number]   The patient returns today for 41-month followup of her known  fibromuscular dysplasia of the carotid arteries bilaterally.  She  underwent an MRI in July 2008.  This continues to reveal fibromuscular  dysplastic changes in the carotid arteries bilaterally.  There also may  be some changes in the vertebrobasilar system.   The patient has remained free of neurological symptoms.  Denies sensory,  motor, or vision deficits.  No syncope or presyncope.  No gait  abnormalities.   The carotid Doppler was carried out today and this reveals increased  velocities in the distal internal carotid arteries bilaterally.  These  were stable when compared to previous exams.  Antegrade vertebral flow  present bilaterally.   The patient is a 50 year old female who appears her stated age.  Alert  and oriented.  In no acute distress.  BP 152/92, pulse 93 per minute and  regular, respirations 16 per minute.  No audible bruits in the neck.  Cranial nerves intact.  Strength is equal bilaterally.  Normal gait.  Reflexes are 1+ bilaterally.   The patient remains asymptomatic related to her fibromuscular dysplasia  of the carotid arteries.  I will plan followup with her again in 6  months and, at that time, obtain MRA of the neck along with carotid  Doppler evaluation.   Cheryl [name], M.D.  Electronically Signed   PGH/MEDQ  D:  08/18/2007  T:  08/19/2007  Job:  [id number]   cc:   Pramod P. Pearlean Brownie, MD  Reuben Likes, M.D.

## 2010-12-16 NOTE — Assessment & Plan Note (Signed)
OFFICE VISIT   Cheryl [name], Cheryl [name]  DOB:  [birth year]                                       03/03/2010  UYQIH#:[id number]   REASON FOR VISIT:  Fibromuscular dysplasia, common carotid arteries.   HISTORY:  This is a 50 year old female patient of Dr. Madilyn Fireman, who he had  been following for fibromuscular dysplasia of her carotid arteries.  She  comes in with an MRI as well as a carotid duplex.  The patient has been  doing well since her last visit.  However, she states that within the  last month she has had episodes of right facial numbness that last for  about 1 minute and then go away.  She also says that for about the same  period of time she has been having trouble with word-finding.  She  denies numbness or weakness in her arms or legs.  She denies slurred  speech.  She denies amaurosis fugax.   The patient also suffers from reflux disease.  She recently undergone  esophageal dilatation for esophageal diverticulum and she is now on  Dexilant.  Her allergies are controlled with Zyrtec and Advair.  She is  a nonsmoker.   SOCIAL HISTORY:  She is married with 2 children.  She works as an Char[employer] fundraiser.  Does not smoke or drink.   FAMILY HISTORY:  Negative for cardiovascular disease at an early age.   PAST SURGICAL HISTORY:  TMJ with jaw advancement in 1992, septoplasty  and reduction of turbinates in 1986, laser reduction of turbinates in  1987, C-section x2 in 1996 and 1998.   REVIEW OF SYSTEMS:  GENERAL:  Negative for fevers, chills, weight gain,  weight loss.  VASCULAR:  Intermittent numbness in the face, aching of her legs at  night.  CARDIAC:  Negative.  GI:  Positive for scattered diverticula in her transverse colon.  NEURO:  Positive for intermittent numbness in her right face.  PULMONARY:  Negative.  HEME:  Positive for iron deficiency.  GU:  Negative.  EENT:  Negative.  MUSCULOSKELETAL:  Positive for joint pain.  PSYCH:  Negative.  SKIN:  Negative.   ALLERGIES:  To sulfa and Ancef.   PHYSICAL EXAMINATION:  Heart rate 84, blood pressure 129/83,  respirations 16.  General:  She is well-appearing, in no distress.  HEENT:  Within normal limits.  Lungs are clear bilaterally.  Cardiovascular:  Regular rate and rhythm.  No murmur.  Abdomen:  Soft,  nontender.  Musculoskeletal:  Without major deformity.  Neuro:  She has  no focal deficits, weakness.  Skin:  Without rash.   DIAGNOSTIC STUDIES:  I have independently reviewed her MRA, which shows  presumed fibromuscular disease in bilateral carotid arteries which is  unchanged from a prior exam.   I ordered and reviewed her carotid ultrasound, which shows a stable  appearance from January 2010.  This is 60%-79% stenosis bilaterally.   ASSESSMENT/PLAN:  Bilateral carotid fibromuscular dysplasia.   PLAN:  It is unclear to me whether not the patient is truly having  symptoms from her fibromuscular disease.  She does have some facial  numbness and word-finding issues; however, these are her only symptoms.  She has been formally seen by Dr. Pearlean Brownie with [facility].  I am  going to refer her back to him to further comment on this.   I have  encouraged her to increase her aspirin to 81 mg every day.  She  now takes it to 2-3 times a week secondary to her reflux disease.  She  said she would be willing to take it every other day.  We also  entertained Plavix, but I will wait until we hear back from Dr. Pearlean Brownie  whether we increase her antiplatelet therapy at this time.  I will plan  on seeing her back in 6 months unless Dr. Pearlean Brownie finds reason to consider  intervention.     Jorge Ny, MD  Electronically Signed   VWB/MEDQ  D:  03/03/2010  T:  03/04/2010  Job:  [id number]   cc:   Pramod P. Pearlean Brownie, MD  Reuben Likes, M.D.

## 2010-12-16 NOTE — Assessment & Plan Note (Signed)
OFFICE VISIT   Cheryl [name], Cheryl [name]  DOB:  [birth year]                                       02/28/2009  EAVWU#:[id number]   The patient returns for 6 month followup of fibromuscular dysplasia of  the carotid arteries.  Underwent an MR angiogram today.  This reveals no  significant change with fibromuscular dysplasia affecting both cervical  internal carotid arteries and both vertebral arteries.  No sign of  increasing extent or worsening stenosis or increasing aneurysmal  dilatation.   The patient remains free of any symptoms.  Denies any symptoms of visual  or sensory deficit.  No gait abnormalities.  No further word finding  problems.  No syncope or presyncope.   The patient appears generally well.  Alert and oriented.  No distress.  BP is 119/76 right arm, 128/78 left arm, pulse is 93 per minute.  Carotids reveal no bruits.  Cranial nerves intact.  Strength equal  bilaterally.   The patient is doing well.  We will plan followup in 1 year with Dr.  Myra Gianotti with an MRA of the neck and a carotid Doppler.   Balinda Quails, M.D.  Electronically Signed   PGH/MEDQ  D:  02/28/2009  T:  03/01/2009  Job:  [id number]   cc:   Reuben Likes, M.D.  Pramod P. Pearlean Brownie, MD

## 2010-12-16 NOTE — Assessment & Plan Note (Signed)
OFFICE VISIT   Cheryl [name], Cheryl [name]  DOB:  01-09-1961                                       02/23/2008  ZOXWR#:[id number]   The patient returned to the office today with an MR angiogram of the  neck and also underwent carotid Doppler.  Her MR angiogram reveals some  progression of the cervical involvement of her right internal carotid  artery with fibromuscular dysplasia.  The left side appears stable.  Her  Doppler reveals bilateral 60-79% stenoses with findings consistent with  fibromuscular dysplasia.  No significant change since January.   The patient has been free of any symptoms.  She notes some chronic  difficulties with word finding.  She, however, denies any sensory, motor  or visual deficits.  No gait abnormalities.  No syncope or presyncope.   She appears generally well.  Right carotid bruit audible.  Cranial  nerves intact.  Strength equal bilaterally.  Blood pressure 136/79,  pulse 87 per minute.   She remains on aspirin 81 mg daily.   I note from Dr. Marlis Edelson consultation that he is in agreement with  continued observation unless she develops symptoms.  Will plan follow-up  again in 6 months with a repeat carotid Doppler and MR angiogram.   Balinda Quails, M.D.  Electronically Signed   PGH/MEDQ  D:  02/23/2008  T:  02/24/2008  Job:  [id number]   cc:   Pramod P. Pearlean Brownie, MD  Reuben Likes, M.D.

## 2010-12-19 NOTE — H&P (Signed)
NAME:  Cheryl [name], Cheryl [name]                        ACCOUNT NO.:  [id number]   MEDICAL RECORD NO.:  [id number]                   PATIENT TYPE:   LOCATION:                                       FACILITY:  [facility]   PHYSICIAN:  Rudy Jew. Ashley Royalty, M.D.             DATE OF BIRTH:  June 21, 1961   DATE OF ADMISSION:  DATE OF DISCHARGE:                                HISTORY & PHYSICAL   HISTORY OF PRESENT ILLNESS:  This is a 50 year old gravida 3, para 2, AB1  who presented April 10, 2002 for annual examination.  At that time she  complained of menorrhagia.  She also had a uterus of upper limits of normal  size.  She subsequently underwent sonohysterogram and ultrasound on  April 27, 2002.  The ultrasound revealed one or more very small  fibroids, perhaps 1 cm or less in diameter.  In addition, the  sonohysterogram revealed an echogenic focus seen fundally and to the left  side of the uterus measuring 1.4 cm in greatest diameter.  This was felt  possibly to be a polyp or fibroid.  The patient is hence for  diagnostic/operative hysteroscopy and for dilatation and curettage.   MEDICATIONS:  Glennie [place] D.   PAST MEDICAL HISTORY:  Vasovagal rhinitis.   PAST SURGICAL HISTORY:  Nasoseptoplasty with reduction of turbinates, TMJ,  cesarean section x2.   ALLERGIES:  MIDRIN--dizziness, SULFA--rash, ANCEF--itching.   FAMILY HISTORY:  Positive for hypertension and diabetes.   SOCIAL HISTORY:  The patient denies use of tobacco or significant alcohol.   REVIEW OF SYSTEMS:  Noncontributory.   PHYSICAL EXAMINATION:  GENERAL:  Well-developed, well-nourished, pleasant  black female in no acute distress.  VITAL SIGNS:  Afebrile.  Vital signs stable.  SKIN:  Warm and dry without lesions.  LYMPH:  There is no supraclavicular, cervical, or inguinal adenopathy.  HEENT:  Normocephalic.  NECK:  Supple without thyromegaly.  CHEST:  Lungs are clear.  CARDIAC:  Regular rate and rhythm without murmurs,  gallops, or rubs.  BREASTS:  Soft without palpable masses, discharge, retraction, or  adenopathy.  ABDOMEN:  Soft and nontender without masses or organomegaly.  Bowel sounds  are active.  MUSCULOSKELETAL:  Full range of motion without edema, cyanosis, or CVA  tenderness.  PELVIC:  Deferred until examination under anesthesia.   IMPRESSION:  1. Echogenic focus on sonohysterogram of 1.4 cm in greatest diameter,     probable polyp versus fibroid.  2. Menorrhagia possibly secondary to number one.  3. Multiple allergies.  4. Status post cesarean section x2.   PLAN:  1. Diagnostic/operative hysteroscopy.  2. Dilatation and curettage.  Risks, benefits, complications, and     alternatives were fully discussed with the patient.  She states she     understands and accepts.  Questions were invited and answered.  James A. Ashley Royalty, M.D.    JAM/MEDQ  D:  05/26/2002  T:  05/26/2002  Job:  [id number]

## 2010-12-19 NOTE — Op Note (Signed)
NAME:  Cheryl [name], Sine NO.:  [id number]   MEDICAL RECORD NO.:  [id number]                   PATIENT TYPE:   LOCATION:                                       FACILITY:   PHYSICIAN:  Rudy Jew. Ashley Royalty, M.D.             DATE OF BIRTH:   DATE OF PROCEDURE:  DATE OF DISCHARGE:                                 OPERATIVE REPORT   PREOPERATIVE DIAGNOSES:  1. Endometrial polyp versus fibroid on sonohysterogram.  2. Menorrhagia possibly secondary to #1.   POSTOPERATIVE DIAGNOSES:  1. Endometrial polyp versus fibroid on sonohysterogram.  2. Menorrhagia possibly secondary to #1.   PROCEDURE:  1. Diagnostic/operative hysteroscopy.  2. Dilatation and curettage.   SURGEON:  Rudy Jew. Ashley Royalty, M.D.   ANESTHESIA:  General.   ESTIMATED BLOOD LOSS:  Less than 50 cc.   COMPLICATIONS:  None.   PACKS AND DRAINS:  None.   DESCRIPTION OF PROCEDURE:  The patient was taken to the operating room and  placed in the dorsal supine position.  After adequate general anesthesia was  administered, she was placed in the lithotomy position and prepped and  draped in the usual manner for gastric surgery.  A posterior weighted  retractor was placed per vagina.  The anterior lip of the cervix was grasped  with a single-tooth tenaculum.  The uterus was gently sounded to  approximately 8 cm.  The cervix was serially dilated with Shawnie Pons dilators to  a size 29 [place].  The resectoscope was placed into the uterine cavity using  sorbitol as a distention medium.  The uterine cavity was thoroughly  surveyed.  Immediately upon entry into the cavity there was a posterior  based polyp noted more on the left side of the uterine cavity.  Appropriate  photos were obtained.  The tubal ostia were seen readily.  The anterior  posterior aspects of the uterine cavity was visualized.  Using the  resectoscope at 70 watts cutting power, 40 watts coagulation wave form, the  polyp was excised and  submitted to pathology for histologic studies.  Hemostasis was easily obtained using the coagulation wave form.   Next attention was turned to the curettage.  A medium size curet was  introduced into the uterine cavity.  A diagnostic curettage was performed  using a four quadrant technique.  Then a therapeutic curettage was  performed.  The curettings were submitted to pathology separately for  histologic studies.   At this point the patient was felt to have benefitted maximally from the  surgical procedure.  The vaginal instruments were removed.  Hemostasis was  noted.  The procedure was terminated.  The patient tolerated the procedure  extremely well and was returned to the recovery room in good condition.  James A. Ashley Royalty, M.D.    JAM/MEDQ  D:  05/26/2002  T:  05/28/2002  Job:  [id number]

## 2011-04-24 LAB — COMPREHENSIVE METABOLIC PANEL
ALT: 20
Albumin: 4.1
BUN: 11
Calcium: 8.8
Glucose, Bld: 87
Sodium: 137
Total Protein: 7.1

## 2011-04-24 LAB — TSH: TSH: 1.082

## 2011-04-24 LAB — DIFFERENTIAL
Lymphs Abs: 1.5
Monocytes Relative: 9
Neutro Abs: 4.8
Neutrophils Relative %: 66

## 2011-04-24 LAB — POCT CARDIAC MARKERS
CKMB, poc: 1.1
CKMB, poc: 1.3
Myoglobin, poc: 61
Troponin i, poc: <0.05
Troponin i, poc: <0.05

## 2011-04-24 LAB — T4: T4, Total: 7.4

## 2011-04-24 LAB — CBC
RBC: 3.92
WBC: 7.3

## 2011-05-01 LAB — CREATININE, SERUM
Creatinine, Ser: 0.92
GFR calc Af Amer: >60
GFR calc non Af Amer: >60

## 2012-01-14 ENCOUNTER — Telehealth: Payer: Self-pay

## 2012-01-14 ENCOUNTER — Encounter: Payer: Self-pay | Admitting: Internal Medicine

## 2012-01-14 ENCOUNTER — Ambulatory Visit (INDEPENDENT_AMBULATORY_CARE_PROVIDER_SITE_OTHER): Payer: 59 | Admitting: Internal Medicine

## 2012-01-14 VITALS — BP 140/100 | Temp 98.1°F | Wt 108.0 lb

## 2012-01-14 DIAGNOSIS — I773 Arterial fibromuscular dysplasia: Secondary | ICD-10-CM

## 2012-01-14 DIAGNOSIS — R410 Disorientation, unspecified: Secondary | ICD-10-CM

## 2012-01-14 DIAGNOSIS — F29 Unspecified psychosis not due to a substance or known physiological condition: Secondary | ICD-10-CM

## 2012-01-14 DIAGNOSIS — I7789 Other specified disorders of arteries and arterioles: Secondary | ICD-10-CM

## 2012-01-14 MED ORDER — LISINOPRIL-HYDROCHLOROTHIAZIDE 10-12.5 MG PO TABS: 1.0000 | ORAL_TABLET | Freq: Every day | ORAL | Status: DC

## 2012-01-14 NOTE — Telephone Encounter (Signed)
Phone call from pt. Reported elevated BP;180/100, 190/101 yesterday evening.  States has had increased stress recently.  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 Also, reports recent tremor in lower lip, slight numbness right side of face, and that coworker noted a slight right facial droop recently.  States missed last follow-up for carotid artery ultrasound.  Today, c/o a "throbbing pain behind right eye".  Pt. has appt. with her PCP at 2:00pm to have BP evaluated.  Advised pt. to discuss recent symptoms with PCP, and will wait for scheduling any doppler today, until evaluated per PCP. Encouraged pt. to have someone drive her to her PCP appt. Today.  Agrees w/ plan.

## 2012-01-14 NOTE — Progress Notes (Signed)
Subjective:    Patient ID: Cheryl [name], female    DOB: 09-Aug-1960, 51 y.o.   MRN: [id number]  HPI  51 year old patient who is seen today for followup. She has been under considerable situational stress working long hours often 12-13 hours per day and has noted to have elevated blood pressure readings. Readings have been the case in excess of 190/100. She complains of stress as well as menopausal issues. She states she is sleeping quite poorly do to the hot flashes through the night. She has been followed closely by vascular surgery do to carotid fibromuscular dysplasia. Other complaints have included fatigue sluggishness and a sense of being lightheaded for the past week she also feels that she has become more forgetful. At times she has gotten lost and has had a difficult time finding her way. She also describes a slight tremor involving her right lower lip as well as some mild paresthesias involving the right facial area. A coworker felt there is also a right facial droop. The patient is scheduled for a followup MRA of the neck in the near future    Review of Systems  Constitutional: Negative for fever, appetite change, fatigue and unexpected weight change.  HENT: Negative for hearing loss, ear pain, nosebleeds, congestion, sore throat, mouth sores, trouble swallowing, neck stiffness, dental problem, voice change, sinus pressure and tinnitus.   Eyes: Negative for photophobia, pain, redness and visual disturbance.  Respiratory: Negative for cough, chest tightness and shortness of breath.   Cardiovascular: Negative for chest pain, palpitations and leg swelling.  Gastrointestinal: Negative for nausea, vomiting, abdominal pain, diarrhea, constipation, blood in stool, abdominal distention and rectal pain.  Genitourinary: Negative for dysuria, urgency, frequency, hematuria, flank pain, vaginal bleeding, vaginal discharge, difficulty urinating, genital sores, vaginal pain, menstrual problem and  pelvic pain.  Musculoskeletal: Negative for back pain and arthralgias.  Skin: Negative for rash.  Neurological: Positive for tremors, weakness, light-headedness and numbness. Negative for dizziness, syncope, speech difficulty and headaches.  Hematological: Negative for adenopathy. Does not bruise/bleed easily.  Psychiatric/Behavioral: Positive for confusion and disturbed wake/sleep cycle. Negative for suicidal ideas, behavioral problems, self-injury, dysphoric mood and agitation. The patient is not nervous/anxious.        Objective:   Physical Exam  Constitutional: She is oriented to person, place, and time. She appears well-developed and well-nourished.       Blood pressure 140/82 on the right arm 146/84 left arm  HENT:  Head: Normocephalic.  Right Ear: External ear normal.  Left Ear: External ear normal.  Mouth/Throat: Oropharynx is clear and moist.  Eyes: Conjunctivae and EOM are normal. Pupils are equal, round, and reactive to light.  Neck: Normal range of motion. Neck supple. No thyromegaly present.  Cardiovascular: Normal rate, regular rhythm, normal heart sounds and intact distal pulses.   Pulmonary/Chest: Effort normal and breath sounds normal.  Abdominal: Soft. Bowel sounds are normal. She exhibits no mass. There is no tenderness.  Musculoskeletal: Normal range of motion.  Lymphadenopathy:    She has no cervical adenopathy.  Neurological: She is alert and oriented to person, place, and time. She has normal reflexes.       Slight numbness to palpation right lower facial area No facial asymmetry Neuro exam otherwise normal Finger to nose testing normal Normal gait Able to do tandem walk without difficulty No drift  Skin: Skin is warm and dry. No rash noted.  Psychiatric: She has a normal mood and affect. Her behavior is normal.  Assessment & Plan:   Carotid artery disease secondary to fibromuscular dysplasia Hypertension. We'll place on a low salt diet;  due  to frequent blood pressure readings in the stage II  Range, will start the antihypertensive therapy  We'll check a brain MRI and MRA of the neck.

## 2012-01-14 NOTE — Patient Instructions (Addendum)
Limit your sodium (Salt) intake  DASH Diet The DASH diet stands for "Dietary Approaches to Stop Hypertension." It is a healthy eating plan that has been shown to reduce high blood pressure (hypertension) in as little as 14 days, while also possibly providing other significant health benefits. These other health benefits include reducing the risk of breast cancer after menopause and reducing the risk of type 2 diabetes, heart disease, colon cancer, and stroke. Health benefits also include weight loss and slowing kidney failure in patients with chronic kidney disease.   DIET GUIDELINES  Limit salt (sodium). Your diet should contain less than 1500 mg of sodium daily.   Limit refined or processed carbohydrates. Your diet should include mostly whole grains. Desserts and added sugars should be used sparingly.   Include small amounts of heart-healthy fats. These types of fats include nuts, oils, and tub margarine. Limit saturated and trans fats. These fats have been shown to be harmful in the body.  CHOOSING FOODS   The following food groups are based on a 2000 calorie diet. See your Registered Dietitian for individual calorie needs. Grains and Grain Products (6 to 8 servings daily)  Eat More Often: Whole-wheat bread, brown rice, whole-grain or wheat pasta, quinoa, popcorn without added fat or salt (air popped).   Eat Less Often: White bread, white pasta, white rice, cornbread.  Vegetables (4 to 5 servings daily)  Eat More Often: Fresh, frozen, and canned vegetables. Vegetables may be raw, steamed, roasted, or grilled with a minimal amount of fat.   Eat Less Often/Avoid: Creamed or fried vegetables. Vegetables in a cheese sauce.  Fruit (4 to 5 servings daily)  Eat More Often: All fresh, canned (in natural juice), or frozen fruits. Dried fruits without added sugar. One hundred percent fruit juice ( cup [237 mL] daily).   Eat Less Often: Dried fruits with added sugar. Canned fruit in light or  heavy syrup.  [employer], Fish, and Poultry (2 servings or less daily. One serving is 3 to 4 oz [85-114 g]).  Eat More Often: Ninety percent or leaner ground beef, tenderloin, sirloin. Round cuts of beef, chicken breast, [place] breast. All fish. Grill, bake, or broil your meat. Nothing should be fried.   Eat Less Often/Avoid: Fatty cuts of meat, [place], or chicken leg, thigh, or wing. Fried cuts of meat or fish.  Dairy (2 to 3 servings)  Eat More Often: Low-fat or fat-free milk, low-fat plain or light yogurt, reduced-fat or part-skim cheese.   Eat Less Often/Avoid: Milk (whole, 2%, skim, or chocolate). Whole milk yogurt. Full-fat cheeses.  Nuts, Seeds, and Legumes (4 to 5 servings per week)  Eat More Often: All without added salt.   Eat Less Often/Avoid: Salted nuts and seeds, canned beans with added salt.  Fats and Sweets (limited)  Eat More Often: Vegetable oils, tub margarines without trans fats, sugar-free gelatin. Mayonnaise and salad dressings.   Eat Less Often/Avoid: Coconut oils, palm oils, butter, stick margarine, cream, half and half, cookies, candy, pie.  FOR MORE INFORMATION The Dash Diet Eating Plan: www.dashdiet.org Document Released: 07/09/2011 Document Reviewed: 06/29/2011 [facility] Patient Information 2012 [place], [place].   Report any new or worsening symptomsMenopause and Herbal Products Menopause is the normal time of life when menstrual periods stop completely. Menopause is complete when you have missed 12 consecutive menstrual periods. It usually occurs between the ages of 18 to 62, with an average age of 70. Very rarely does a woman develop menopause before 52 years old. At  menopause, your ovaries stop producing the female hormones, estrogen and progesterone. This can cause undesirable symptoms and also affect your health. Sometimes the symptoms can occur 4 to 5 years before the menopause begins. There is no relationship between menopause and:  Oral contraceptives.    Number of children you had.   Race.   The age your menstrual periods started (menarche).  Heavy smokers and very thin women may develop menopause earlier in life. Estrogen and progesterone hormone treatment is the usual method of treating menopausal symptoms. However, there are women who should not take hormone treatment. This is true of:    Women that have breast or uterine cancer.   Women who prefer not to take hormones because of certain side effects (abnormal uterine bleeding).   Women who are afraid that hormones may cause breast cancer.   Women who have a history of liver disease, heart disease, stroke, or blood clots.  For these women, there are other medications that may help treat their menopausal symptoms. These medications are found in plants and botanical products. They can be found in the form of herbs, teas, oils, tinctures, and pills.   CAUSES:  The ovaries stop producing the female hormones estrogen and progesterone.   Other causes include:   Surgery to remove both ovaries.   The ovaries stop functioning for no know reason.   Tumors of the pituitary gland in the brain.   Medical disease that affects the ovaries and hormone production.   Radiation treatment to the abdomen or pelvis.   Chemotherapy that affects the ovaries.  PHYTOESTROGENS: Phytoestrogen's occur naturally in plants and plant products. They act like estrogen in the body. Herbal medications are made from these plants and botanical steroids. There are 3 types of phytoestrogens:  Isoflavones (genistein and daidzein) are found in soy, garbanzo beans, miso and tofu foods.   Ligins are found in the shell of seeds. They are used to make oils like flaxseed oil. The bacteria in your intestine act on these foods to produce the estrogen-like hormones.   Coumestans are estrogen-like. Some of the foods they are found in include sunflower seeds and bean sprouts.  CONDITIONS AND THERE POSSIBLE HERBAL  TREATMENT:  Hot flashes and night sweats.   Soy, black cohosh and evening primrose.   Irritability, insomnia, depression and memory problems.   Chasteberry, ginseng, and soy.   St. John's wort may be helpful for depression. However, there is a concern of it causing cataracts of the eye and may have bad effects on other medications. St. John's wort should not be taken for long time and without your caregiver's advice.   Loss of libido and vaginal and skin dryness.   Wild yam and soy.   Prevention of coronary heart disease and osteoporosis.   Soy and Isoflavones.  Several studies have shown that some women benefit from herbal medications, but most of the studies have not consistently shown that these supplements are much better than placebo. Other forms of treatment to help women with menopausal symptoms include a balanced diet, rest, exercise, vitamin and calcium (with vitamin D) supplements, acupuncture, and group therapy when necessary. THOSE WHO SHOULD NOT TAKE HERBAL MEDICATIONS INCLUDE:  Women who are planning on getting pregnant unless told by your caregiver.   Women who are breastfeeding unless told by your caregiver.   Women who are taking other prescription medications unless told by your caregiver.   Infants, children, and elderly women unless told by your caregiver.  Different herbal  medications have different and unmeasured amounts of the herbal ingredients. There are no regulations, quality control, and standardization of the ingredients in herbal medications. Therefore, the amount of the ingredient in the medication may vary from one herb, pill, tea, oil or tincture to another. Many herbal medications can cause serious problems and can even have poisonous effects if taken too much or too long. If problems develop, the medication should be stopped and recorded by your caregiver. HOME CARE INSTRUCTIONS  Do not take or give children herbal medications without your  caregiver's advice.   Let your caregiver know all the medications you are taking. This includes prescription, over-the-counter, eye drops, and creams.   Do not take herbal medications longer or more than recommended.   Tell your caregiver about any side effects from the medication.  SEEK MEDICAL CARE IF:  You develop a fever of 102 F (38.9 C), or as directed by your caregiver.   You feel sick to your stomach (nauseous), vomit, or have diarrhea.   You develop a rash.   You develop abdominal pain.   You develop severe headaches.   You start to have vision problems.   You feel dizzy or faint.   You start to feel numbness in any part of your body.   You start shaking (have convulsions).  Document Released: 01/06/2008 Document Revised: 07/09/2011 Document Reviewed: 08/05/2010 [facility] Patient Information 2012 [place], [place].Menopause Menopause is the normal time of life when menstrual periods stop completely. Menopause is complete when you have missed 12 consecutive menstrual periods. It usually occurs between the ages of 31 to 50, with an average age of 34. Very rarely does a woman develop menopause before 52 years old. At menopause, your ovaries stop producing the female hormones, estrogen and progesterone. This can cause undesirable symptoms and also affect your health. Sometimes the symptoms may occur 4 to 5 years before the menopause begins. There is no relationship between menopause and:  Oral contraceptives.   Number of children you had.   Race.   The age your menstrual periods started (menarche).  Heavy smokers and very thin women may develop menopause earlier in life. CAUSES  The ovaries stop producing the female hormones estrogen and progesterone.   Other causes include:   Surgery to remove both ovaries.   The ovaries stop functioning for no known reason.   Tumors of the pituitary gland in the brain.   Medical disease that affects the ovaries and hormone  production.   Radiation treatment to the abdomen or pelvis.   Chemotherapy that affects the ovaries.  SYMPTOMS    Hot flashes.   Night sweats.   Decrease in sex drive.   Vaginal dryness and thinning of the vagina causing painful intercourse.   Dryness of the skin and developing wrinkles.   Headaches.   Tiredness.   Irritability.   Memory problems.   Weight gain.   Bladder infections.   Hair growth of the face and chest.   Infertility.  More serious symptoms include:  Loss of bone (osteoporosis) causing breaks (fractures).   Depression.   Hardening and narrowing of the arteries (atherosclerosis) causing heart attacks and strokes.  DIAGNOSIS    When the menstrual periods have stopped for 12 straight months.   Physical exam.   Hormone studies of the blood.  TREATMENT   There are many treatment choices and nearly as many questions about them. The decisions to treat or not to treat menopausal changes is an individual choice made with  your caregiver. Your caregiver can discuss the treatments with you. Together, you can decide which treatment will work best for you. Your treatment choices may include:    Hormone therapy (estorgen and progesterone).   Non-hormonal medications.   Treating the individual symptoms with medication (for example antidepressants for depression).   Herbal medications that may help specific symptoms.   Counseling by a psychiatrist or psychologist.   Group therapy.   Lifestyle changes including:   Eating healthy.   Regular exercise.   Limiting caffeine and alcohol.   Stress management and meditation.   No treatment.  HOME CARE INSTRUCTIONS    Take the medication your caregiver gives you as directed.   Get plenty of sleep and rest.   Exercise regularly.   Eat a diet that contains calcium (good for the bones) and soy products (acts like estrogen hormone).   Avoid alcoholic beverages.   Do not smoke.   If you have hot  flashes, dress in layers.   Take supplements, calcium and vitamin D to strengthen bones.   You can use over-the-counter lubricants or moisturizers for vaginal dryness.   Group therapy is sometimes very helpful.   Acupuncture may be helpful in some cases.  SEEK MEDICAL CARE IF:    You are not sure you are in menopause.   You are having menopausal symptoms and need advice and treatment.   You are still having menstrual periods after age 36.   You have pain with intercourse.   Menopause is complete (no menstrual period for 12 months) and you develop vaginal bleeding.   You need a referral to a specialist (gynecologist, psychiatrist or psychologist) for treatment.  SEEK IMMEDIATE MEDICAL CARE IF:    You have severe depression.   You have excessive vaginal bleeding.   You fell and think you have a broken bone.   You have pain when you urinate.   You develop leg or chest pain.   You have a fast pounding heart beat (palpitations).   You have severe headaches.   You develop vision problems.   You feel a lump in your breast.   You have abdominal pain or severe indigestion.  Document Released: 10/10/2003 Document Revised: 07/09/2011 Document Reviewed: 05/17/2008 [facility] Patient Information 2012 [place], [place].

## 2012-01-15 ENCOUNTER — Other Ambulatory Visit (HOSPITAL_COMMUNITY): Payer: 59

## 2012-01-15 ENCOUNTER — Ambulatory Visit (HOSPITAL_COMMUNITY)
Admission: RE | Admit: 2012-01-15 | Discharge: 2012-01-15 | Disposition: A | Discharge status: Home / Self Care | Payer: 59 | Source: Ambulatory Visit | Attending: Internal Medicine | Admitting: Internal Medicine

## 2012-01-15 ENCOUNTER — Other Ambulatory Visit: Payer: Self-pay | Admitting: Internal Medicine

## 2012-01-15 DIAGNOSIS — R410 Disorientation, unspecified: Secondary | ICD-10-CM

## 2012-01-15 DIAGNOSIS — R259 Unspecified abnormal involuntary movements: Secondary | ICD-10-CM | POA: Insufficient documentation

## 2012-01-15 DIAGNOSIS — R209 Unspecified disturbances of skin sensation: Secondary | ICD-10-CM | POA: Insufficient documentation

## 2012-01-15 DIAGNOSIS — H0520 Unspecified exophthalmos: Secondary | ICD-10-CM | POA: Insufficient documentation

## 2012-01-15 DIAGNOSIS — I10 Essential (primary) hypertension: Secondary | ICD-10-CM | POA: Insufficient documentation

## 2012-01-15 DIAGNOSIS — I773 Arterial fibromuscular dysplasia: Secondary | ICD-10-CM

## 2012-01-15 DIAGNOSIS — I7789 Other specified disorders of arteries and arterioles: Secondary | ICD-10-CM | POA: Insufficient documentation

## 2012-01-15 DIAGNOSIS — R413 Other amnesia: Secondary | ICD-10-CM | POA: Insufficient documentation

## 2012-01-15 MED ORDER — GADOBENATE DIMEGLUMINE 529 MG/ML IV SOLN
10.0000 mL | Freq: Once | INTRAVENOUS | Status: AC | PRN
  Administered 2012-01-15: 10 mL via INTRAVENOUS

## 2012-01-18 ENCOUNTER — Telehealth: Payer: Self-pay | Admitting: Internal Medicine

## 2012-01-18 LAB — POCT I-STAT, CHEM 8
BUN: 21 mg/dL (ref 6–23)
Calcium, Ion: 1.23 mmol/L (ref 1.12–1.32)
HCT: 34 % — ABNORMAL LOW (ref 36.0–46.0)
TCO2: 25 mmol/L (ref 0–100)

## 2012-01-18 NOTE — Telephone Encounter (Signed)
Please call - [phone]

## 2012-01-18 NOTE — Telephone Encounter (Signed)
PT requesting results of MRI and MRA. Please contact

## 2012-01-28 ENCOUNTER — Other Ambulatory Visit: Payer: Self-pay | Admitting: Obstetrics and Gynecology

## 2012-01-28 DIAGNOSIS — Z1231 Encounter for screening mammogram for malignant neoplasm of breast: Secondary | ICD-10-CM

## 2012-02-03 ENCOUNTER — Ambulatory Visit
Admission: RE | Admit: 2012-02-03 | Discharge: 2012-02-03 | Disposition: A | Discharge status: Home / Self Care | Payer: 59 | Source: Ambulatory Visit | Attending: Obstetrics and Gynecology | Admitting: Obstetrics and Gynecology

## 2012-02-03 DIAGNOSIS — Z1231 Encounter for screening mammogram for malignant neoplasm of breast: Secondary | ICD-10-CM

## 2012-02-05 ENCOUNTER — Encounter: Payer: Self-pay | Admitting: Surgery

## 2012-02-08 ENCOUNTER — Other Ambulatory Visit: Payer: Self-pay

## 2012-02-08 ENCOUNTER — Ambulatory Visit: Payer: Self-pay | Admitting: Surgery

## 2012-03-11 ENCOUNTER — Encounter: Payer: Self-pay | Admitting: Surgery

## 2012-03-14 ENCOUNTER — Ambulatory Visit: Payer: Self-pay | Admitting: Surgery

## 2012-03-14 ENCOUNTER — Other Ambulatory Visit: Payer: Self-pay

## 2012-04-05 ENCOUNTER — Other Ambulatory Visit: Payer: Self-pay | Admitting: *Deleted

## 2012-04-05 DIAGNOSIS — I773 Arterial fibromuscular dysplasia: Secondary | ICD-10-CM

## 2012-04-08 ENCOUNTER — Encounter: Payer: Self-pay | Admitting: Surgery

## 2012-04-11 ENCOUNTER — Other Ambulatory Visit (INDEPENDENT_AMBULATORY_CARE_PROVIDER_SITE_OTHER): Payer: 59 | Admitting: *Deleted

## 2012-04-11 ENCOUNTER — Ambulatory Visit (INDEPENDENT_AMBULATORY_CARE_PROVIDER_SITE_OTHER): Payer: 59 | Admitting: Surgery

## 2012-04-11 ENCOUNTER — Encounter: Payer: Self-pay | Admitting: Surgery

## 2012-04-11 VITALS — BP 150/85 | HR 92 | Resp 16 | Ht 60.5 in | Wt 105.4 lb

## 2012-04-11 DIAGNOSIS — I773 Arterial fibromuscular dysplasia: Secondary | ICD-10-CM

## 2012-04-11 DIAGNOSIS — I6529 Occlusion and stenosis of unspecified carotid artery: Secondary | ICD-10-CM

## 2012-04-11 DIAGNOSIS — I7789 Other specified disorders of arteries and arterioles: Secondary | ICD-10-CM

## 2012-04-11 NOTE — Addendum Note (Signed)
Addended by: Sharee Pimple on: 04/11/2012 02:48 PM   Modules accepted: Orders

## 2012-04-11 NOTE — Progress Notes (Signed)
[facility] [place]   Patient name: Cheryl [name] MRN: [id number] DOB: [birth year] Sex: female     Chief Complaint  Patient presents with  . Carotid    6 month f/u -  last seen 09/09/2011    HISTORY OF PRESENT ILLNESS: The patient is back today for followup of her bilateral carotid fibromuscular disease. She recently was evaluated for memory loss as well as transient numbness of her right cheek and lip and word finding difficulty. She was evaluated with a MRA and MRI of the head and neck. These results were negative for acute stroke. There was an area in the left internal carotid that may represent a focal dissection, however this was not significantly different than the study 3 years prior. She has been placed on Celexa to try and help with some of her stress and depression and anxiety. Since starting the medicine she has noted some improvement in her symptoms.  Past Medical History  Diagnosis Date  . Bursitis of left hip   . Personal history of colonic polyps     Adenomatous   . GERD (gastroesophageal reflux disease)   . Dysphagia   . Iron deficiency anemia   . Hyperlipemia   . Esophagitis   . Hemorrhoids   . Arthritis   . Diverticular disease   . Hypertension     Past Surgical History  Procedure Date  . Cesarean section     x 2   . Nasoseptoplasty     History   Social History  . Marital Status: Married    Spouse Name: N/A    Number of Children: N/A  . Years of Education: N/A   Occupational History  . RN    Social History Main Topics  . Smoking status: Never Smoker   . Smokeless tobacco: Never Used  . Alcohol Use: No  . Drug Use: No  . Sexually Active: Not on file   Other Topics Concern  . Not on file   Social History Narrative   No regular exercise    Family History  Problem Relation Age of Onset  . Colon cancer Neg Hx   . Prostate cancer Father   . Diabetes Maternal Aunt   . Cancer Father     Laryngeal    Allergies as  of 04/11/2012 - Review Complete 04/11/2012  Allergen Reaction Noted  . Cefazolin    . Sulfonamide derivatives      Current Outpatient Prescriptions on File Prior to Visit  Medication Sig Dispense Refill  . aspirin 81 MG tablet Take 81 mg by mouth daily.      . cetirizine (ZYRTEC) 10 MG tablet Take 10 mg by mouth as needed.      . citalopram (CELEXA) 10 MG tablet Take 10 mg by mouth daily.      . Lansoprazole (PREVACID PO) Take by mouth as needed.      Marland Kitchen lisinopril-hydrochlorothiazide (PRINZIDE,ZESTORETIC) 10-12.5 MG per tablet Take 1 tablet by mouth daily.  90 tablet  3  . Chlorpheniramine-PSE-Ibuprofen (ADVIL ALLERGY SINUS) 2-30-200 MG TABS Take by mouth as directed.      Marland Kitchen dexlansoprazole (DEXILANT) 60 MG capsule Take 60 mg by mouth daily.         REVIEW OF SYSTEMS: Please see history of present illness otherwise positive for occasional discomfort in the mid chest which she attributes to acid reflux. Occasional difficulty with speaking and pronouncing words as well as for getting words and intermittent dizziness. All other systems  negative PHYSICAL EXAMINATION:   Vital signs are BP 150/85  Pulse 92  Resp 16  Ht 5' 0.5" (1.537 m)  Wt 105 lb 6.4 oz (47.809 kg)  BMI 20.25 kg/m2  SpO2 100% General: The patient appears their stated age. HEENT:  No gross abnormalities Pulmonary:  Non labored breathing Musculoskeletal: There are no major deformities. Neurologic: No focal weakness or paresthesias are detected, Skin: There are no ulcer or rashes noted. Psychiatric: The patient has normal affect. Cardiovascular: There is a regular rate and rhythm without significant murmur appreciated. Positive for bilateral carotid bruit   Diagnostic Studies Duplex ultrasound was ordered and reviewed today. This shows 60-79% stenosis in bilateral internal carotid arteries as well as fibromuscular changes.  Assessment: Carotid fibromuscular dysplasia Plan: We can discussed the limited role for  surgery in fibromuscular disease. I agree with continuing to get surveillance studies including ultrasound and MRI to evaluate for potential complications including stenosis and dissection and/or pseudoaneurysm. I have extensively reviewed the MRI from this summer. It was compared to a study in 2009 and I did not feel there was a significant change in the area of the left internal carotid artery the to be consistent with a focal dissection. The patient is currently taking aspirin for antiplatelet therapy. We had a discussion on changing her over to Plavix and aspirin however she states that she does bleed very easily even before she started taking aspirin. Therefore I recommended just the aspirin. She is going to come back and see me with a repeat carotid ultrasound in one year  V. Charlena Cross, M.D. [facility] [place] Office: [phone] Pager:  [phone]

## 2013-04-07 ENCOUNTER — Other Ambulatory Visit: Payer: Self-pay

## 2013-04-07 ENCOUNTER — Encounter: Payer: Self-pay | Admitting: Surgery

## 2013-04-07 DIAGNOSIS — Z1231 Encounter for screening mammogram for malignant neoplasm of breast: Secondary | ICD-10-CM

## 2013-04-10 ENCOUNTER — Encounter: Payer: Self-pay | Admitting: Surgery

## 2013-04-10 ENCOUNTER — Ambulatory Visit (INDEPENDENT_AMBULATORY_CARE_PROVIDER_SITE_OTHER): Payer: 59 | Admitting: Surgery

## 2013-04-10 ENCOUNTER — Other Ambulatory Visit (INDEPENDENT_AMBULATORY_CARE_PROVIDER_SITE_OTHER): Payer: 59 | Admitting: Vascular Surgery

## 2013-04-10 DIAGNOSIS — I7789 Other specified disorders of arteries and arterioles: Secondary | ICD-10-CM

## 2013-04-10 DIAGNOSIS — I773 Arterial fibromuscular dysplasia: Secondary | ICD-10-CM

## 2013-04-10 DIAGNOSIS — I6529 Occlusion and stenosis of unspecified carotid artery: Secondary | ICD-10-CM

## 2013-04-10 NOTE — Progress Notes (Signed)
[facility] [place]   Patient name: Cheryl [name] MRN: [id number] DOB: 12/25/1960 Sex: female     Chief Complaint  Patient presents with  . Re-evaluation    1 year f/u w/ carotid duplex    HISTORY OF PRESENT ILLNESS: With the patient comes back today for followup of her carotid fibromuscular dysplasia. She has had a relatively uneventful course  had last seen her. Other than what she describes as perimenopausal symptoms, she has not had further episodes of numbness in her face. Her memory appears to be stable. She denies localizing symptoms. She will get intermittent dizziness, but to be cyst to allergies and nasal congestion.  Past Medical History  Diagnosis Date  . Bursitis of left hip   . Personal history of colonic polyps     Adenomatous   . GERD (gastroesophageal reflux disease)   . Dysphagia   . Iron deficiency anemia   . Hyperlipemia   . Esophagitis   . Hemorrhoids   . Arthritis   . Diverticular disease   . Hypertension     Past Surgical History  Procedure Laterality Date  . Cesarean section      x 2   . Nasoseptoplasty      History   Social History  . Marital Status: Married    Spouse Name: N/A    Number of Children: N/A  . Years of Education: N/A   Occupational History  . RN    Social History Main Topics  . Smoking status: Never Smoker   . Smokeless tobacco: Never Used  . Alcohol Use: No  . Drug Use: No  . Sexual Activity: Not on file   Other Topics Concern  . Not on file   Social History Narrative   No regular exercise    Family History  Problem Relation Age of Onset  . Colon cancer Neg Hx   . Prostate cancer Father   . Cancer Father     Laryngeal  . Diabetes Maternal Aunt   . Hypertension Mother   . Hyperlipidemia Mother   . Other Mother     varicose veins    Allergies as of 04/10/2013 - Review Complete 04/10/2013  Allergen Reaction Noted  . Cefazolin    . Sulfonamide derivatives      Current  Outpatient Prescriptions on File Prior to Visit  Medication Sig Dispense Refill  . aspirin 81 MG tablet Take 81 mg by mouth daily.      . cetirizine (ZYRTEC) 10 MG tablet Take 10 mg by mouth as needed.      . Chlorpheniramine-PSE-Ibuprofen (ADVIL ALLERGY SINUS) 2-30-200 MG TABS Take by mouth as directed.      . Lansoprazole (PREVACID PO) Take by mouth as needed.      . citalopram (CELEXA) 10 MG tablet Take 10 mg by mouth daily.      Marland Kitchen dexlansoprazole (DEXILANT) 60 MG capsule Take 60 mg by mouth daily.      Marland Kitchen lisinopril-hydrochlorothiazide (PRINZIDE,ZESTORETIC) 10-12.5 MG per tablet Take 1 tablet by mouth daily.  90 tablet  3   No current facility-administered medications on file prior to visit.     REVIEW OF SYSTEMS: Please see history of present illness, otherwise all systems negative  PHYSICAL EXAMINATION:   Vital signs are BP 149/80  Pulse 79  Ht 5' (1.524 m)  Wt 101 lb (45.813 kg)  BMI 19.73 kg/m2  SpO2 100% General: The patient appears their stated age. HEENT:  No gross abnormalities  Pulmonary:  Non labored breathing Musculoskeletal: There are no major deformities. Neurologic: No focal weakness or paresthesias are detected, Skin: There are no ulcer or rashes noted. Psychiatric: The patient has normal affect. Cardiovascular: There is a regular rate and rhythm without significant murmur appreciated. No carotid bruits   Diagnostic Studies Carotid duplex was ordered and reviewed. This study is essentially unchanged since 2013. This shows 60-79% bilateral carotid stenosis consistent with fibromuscular dysplasia.  Assessment: Bilateral fibromuscular dysplasia Plan: There has been no significant interval change with the patient's duplex imaging study. She has undergone MRA imaging in the past to evaluate her intracranial system. Her most recent MRA was in June of 2013. This showed diffuse fibromuscular changes within the internal carotid artery and vertebral arteries. I have  recommended repeat her carotid ultrasound in one year. The following year out recommend MRA of the head and neck to evaluate her intracranial circulation. She continues to take an aspirin which I have recommended.  The patient is not taking her blood pressure medicine currently because her blood pressure has been under good control. A slightly elevated today. Certainly if her blood pressure becomes an issue further evaluation of her renal arteries for renal fibromuscular dysplasia may be warranted. However, at this time I would not images because her blood pressure is not a significant issue for her.  Jorge Ny, M.D. [facility] [place] Office: [phone] Pager:  [phone]

## 2013-04-11 ENCOUNTER — Other Ambulatory Visit: Payer: Self-pay | Admitting: *Deleted

## 2013-04-20 ENCOUNTER — Ambulatory Visit
Admission: RE | Admit: 2013-04-20 | Discharge: 2013-04-20 | Disposition: A | Discharge status: Home / Self Care | Payer: 59 | Source: Ambulatory Visit

## 2013-04-20 DIAGNOSIS — Z1231 Encounter for screening mammogram for malignant neoplasm of breast: Secondary | ICD-10-CM

## 2013-05-18 ENCOUNTER — Other Ambulatory Visit: Payer: Self-pay | Admitting: Obstetrics and Gynecology

## 2013-09-25 ENCOUNTER — Other Ambulatory Visit: Payer: Self-pay | Admitting: Surgery

## 2013-09-25 DIAGNOSIS — I6529 Occlusion and stenosis of unspecified carotid artery: Secondary | ICD-10-CM

## 2014-04-13 ENCOUNTER — Encounter: Payer: Self-pay | Admitting: Surgery

## 2014-04-16 ENCOUNTER — Encounter: Payer: Self-pay | Admitting: Surgery

## 2014-04-16 ENCOUNTER — Ambulatory Visit (HOSPITAL_COMMUNITY)
Admission: RE | Admit: 2014-04-16 | Discharge: 2014-04-16 | Disposition: A | Discharge status: Home / Self Care | Payer: 59 | Source: Ambulatory Visit | Attending: Surgery | Admitting: Surgery

## 2014-04-16 ENCOUNTER — Ambulatory Visit (INDEPENDENT_AMBULATORY_CARE_PROVIDER_SITE_OTHER): Payer: 59 | Admitting: Surgery

## 2014-04-16 VITALS — BP 154/86 | HR 90 | Temp 97.9°F | Resp 16 | Ht 60.0 in | Wt 104.0 lb

## 2014-04-16 DIAGNOSIS — I773 Arterial fibromuscular dysplasia: Secondary | ICD-10-CM

## 2014-04-16 DIAGNOSIS — I6529 Occlusion and stenosis of unspecified carotid artery: Secondary | ICD-10-CM | POA: Diagnosis not present

## 2014-04-16 DIAGNOSIS — I7789 Other specified disorders of arteries and arterioles: Secondary | ICD-10-CM

## 2014-04-16 NOTE — Progress Notes (Signed)
Patient name: Cheryl [name] MRN: [id number] DOB: February 07, 1961 Sex: female     Chief Complaint  Patient presents with  . Carotid    1 year follow up     HISTORY OF PRESENT ILLNESS: The patient is back today for followup of her carotid fibromuscular dysplasia.  She reports no symptoms other than going through menopause.  She has had some memory loss issues which she attributes to menopause.  Past Medical History  Diagnosis Date  . Bursitis of left hip   . Personal history of colonic polyps     Adenomatous   . GERD (gastroesophageal reflux disease)   . Dysphagia   . Iron deficiency anemia   . Hyperlipemia   . Esophagitis   . Hemorrhoids   . Arthritis   . Diverticular disease   . Hypertension     Past Surgical History  Procedure Laterality Date  . Cesarean section      x 2   . Nasoseptoplasty    . Temporomandibular joint surgery  1989    History   Social History  . Marital Status: Married    Spouse Name: N/A    Number of Children: N/A  . Years of Education: N/A   Occupational History  . RN    Social History Main Topics  . Smoking status: Never Smoker   . Smokeless tobacco: Never Used  . Alcohol Use: No  . Drug Use: No  . Sexual Activity: Not on file   Other Topics Concern  . Not on file   Social History Narrative   No regular exercise    Family History  Problem Relation Age of Onset  . Colon cancer Neg Hx   . Prostate cancer Father   . Cancer Father     Laryngeal  . Diabetes Maternal Aunt   . Hypertension Mother   . Hyperlipidemia Mother   . Other Mother     varicose veins    Allergies as of 04/16/2014 - Review Complete 04/16/2014  Allergen Reaction Noted  . Cefazolin    . Sulfonamide derivatives      Current Outpatient Prescriptions on File Prior to Visit  Medication Sig Dispense Refill  . aspirin 81 MG tablet Take 81 mg by mouth daily.      . cetirizine (ZYRTEC) 10 MG tablet Take 10 mg by mouth as needed.      .  Chlorpheniramine-PSE-Ibuprofen (ADVIL ALLERGY SINUS) 2-30-200 MG TABS Take by mouth as directed.      . citalopram (CELEXA) 10 MG tablet Take 10 mg by mouth daily.      . Lansoprazole (PREVACID PO) Take by mouth as needed.      Marland Kitchen dexlansoprazole (DEXILANT) 60 MG capsule Take 60 mg by mouth daily.      Marland Kitchen lisinopril-hydrochlorothiazide (PRINZIDE,ZESTORETIC) 10-12.5 MG per tablet Take 1 tablet by mouth daily.  90 tablet  3   No current facility-administered medications on file prior to visit.     REVIEW OF SYSTEMS: Please see history of present illness otherwise all negative  PHYSICAL EXAMINATION:   Vital signs are BP 154/86  Pulse 90  Temp(Src) 97.9 F (36.6 C) (Oral)  Resp 16  Ht 5' (1.524 m)  Wt 104 lb (47.174 kg)  BMI 20.31 kg/m2  LMP 12/22/2011 General: The patient appears their stated age. HEENT:  No gross abnormalities Pulmonary:  Non labored breathing Musculoskeletal: There are no major deformities. Neurologic: No focal weakness or paresthesias are detected, Skin: There are no  ulcer or rashes noted. Psychiatric: The patient has normal affect. Cardiovascular: There is a regular rate and rhythm without significant murmur appreciated.  No carotid bruit   Diagnostic Studies Carotid duplex was ordered and reviewed by myself today.  Bilateral carotid stenosis or in the 40-59% category.  This is a slight decrease from previous studies  Assessment: Bilateral carotid fibromuscular dysplasia Plan: The patient remained asymptomatic with a slight decrease in the degree of stenosis by ultrasound in her carotid arteries.  I have recommended that we follow her in 2 years with a repeat ultrasound since her disease has been stable through the years.  She will contact me should she develop symptoms before then  V. Charlena Cross, M.D. [facility] Office: [phone] Pager:  [phone]

## 2014-07-12 ENCOUNTER — Other Ambulatory Visit: Payer: Self-pay | Admitting: Obstetrics and Gynecology

## 2014-07-12 LAB — HM PAP SMEAR: HM PAP: NORMAL

## 2014-07-13 LAB — CYTOLOGY - PAP

## 2014-07-19 ENCOUNTER — Encounter: Payer: Self-pay | Admitting: Internal Medicine

## 2014-07-25 ENCOUNTER — Encounter: Payer: Self-pay | Admitting: Internal Medicine

## 2014-12-07 ENCOUNTER — Encounter: Payer: Self-pay | Admitting: Gastroenterology

## 2014-12-18 ENCOUNTER — Ambulatory Visit (INDEPENDENT_AMBULATORY_CARE_PROVIDER_SITE_OTHER): Payer: 59 | Admitting: Internal Medicine

## 2014-12-18 ENCOUNTER — Encounter: Payer: Self-pay | Admitting: Internal Medicine

## 2014-12-18 VITALS — BP 146/90 | HR 85 | Temp 97.9°F | Resp 18 | Ht 60.0 in | Wt 99.0 lb

## 2014-12-18 DIAGNOSIS — Z0000 Encounter for general adult medical examination without abnormal findings: Secondary | ICD-10-CM

## 2014-12-18 DIAGNOSIS — Z860100 Personal history of colon polyps, unspecified: Secondary | ICD-10-CM

## 2014-12-18 DIAGNOSIS — R5383 Other fatigue: Secondary | ICD-10-CM

## 2014-12-18 DIAGNOSIS — I773 Arterial fibromuscular dysplasia: Secondary | ICD-10-CM

## 2014-12-18 DIAGNOSIS — Z862 Personal history of diseases of the blood and blood-forming organs and certain disorders involving the immune mechanism: Secondary | ICD-10-CM | POA: Diagnosis not present

## 2014-12-18 LAB — LIPID PANEL
CHOLESTEROL: 256 mg/dL — AB (ref 0–200)
HDL: 71.9 mg/dL (ref >39.00)
LDL CALC: 173 mg/dL — AB (ref 0–99)
NonHDL: 184.1
TRIGLYCERIDES: 56 mg/dL (ref 0.0–149.0)
Total CHOL/HDL Ratio: 4
VLDL: 11.2 mg/dL (ref 0.0–40.0)

## 2014-12-18 LAB — COMPREHENSIVE METABOLIC PANEL
ALT: 18 U/L (ref 0–35)
AST: 25 U/L (ref 0–37)
Albumin: 4.7 g/dL (ref 3.5–5.2)
Alkaline Phosphatase: 76 U/L (ref 39–117)
BILIRUBIN TOTAL: 0.5 mg/dL (ref 0.2–1.2)
BUN: 19 mg/dL (ref 6–23)
CO2: 30 mEq/L (ref 19–32)
Calcium: 10.3 mg/dL (ref 8.4–10.5)
Chloride: 103 mEq/L (ref 96–112)
Creatinine, Ser: 0.78 mg/dL (ref 0.40–1.20)
GFR: 98.73 mL/min (ref >60.00)
Glucose, Bld: 86 mg/dL (ref 70–99)
Potassium: 4.8 mEq/L (ref 3.5–5.1)
Sodium: 140 mEq/L (ref 135–145)
Total Protein: 7.5 g/dL (ref 6.0–8.3)

## 2014-12-18 LAB — CBC WITH DIFFERENTIAL/PLATELET
Basophils Absolute: 0 10*3/uL (ref 0.0–0.1)
Basophils Relative: 0.4 % (ref 0.0–3.0)
Eosinophils Absolute: 0.1 10*3/uL (ref 0.0–0.7)
Eosinophils Relative: 0.8 % (ref 0.0–5.0)
HEMATOCRIT: 41.7 % (ref 36.0–46.0)
HEMOGLOBIN: 13.7 g/dL (ref 12.0–15.0)
Lymphocytes Relative: 24.1 % (ref 12.0–46.0)
Lymphs Abs: 1.8 10*3/uL (ref 0.7–4.0)
MCHC: 32.9 g/dL (ref 30.0–36.0)
MCV: 83.8 fl (ref 78.0–100.0)
MONO ABS: 0.5 10*3/uL (ref 0.1–1.0)
MONOS PCT: 7.2 % (ref 3.0–12.0)
NEUTROS ABS: 5.1 10*3/uL (ref 1.4–7.7)
Neutrophils Relative %: 67.5 % (ref 43.0–77.0)
Platelets: 224 10*3/uL (ref 150.0–400.0)
RBC: 4.97 Mil/uL (ref 3.87–5.11)
RDW: 13.9 % (ref 11.5–15.5)
WBC: 7.6 10*3/uL (ref 4.0–10.5)

## 2014-12-18 LAB — TSH: TSH: 0.87 u[IU]/mL (ref 0.35–4.50)

## 2014-12-18 NOTE — Patient Instructions (Signed)
Limit your sodium (Salt) intake  Please check your blood pressure on a regular basis.  If it is consistently greater than 140/90, please make an office appointment.   

## 2014-12-18 NOTE — Progress Notes (Signed)
Subjective:    Patient ID: Cheryl [name], female    DOB: 04/05/1961, 54 y.o.   MRN: [id number]  HPI  Wt Readings from Last 3 Encounters:  12/18/14 99 lb (44.906 kg)  04/16/14 104 lb (47.174 kg)  04/10/13 101 lb (45.30813 kg)   54 year old patient who is seen today in follow-up.  She is followed closely by vascular surgery due to FMD of the carotid arteries.  This has been stable.  Complaints include fatigue, insomnia and some minimal weight loss.  She has been under situational stress.  Her husband has been diagnosed with prostate cancer.  Mother has had a recent stroke but is improving.  Her daughter also has been ill but also has stabilized.  She states she does have a prior history of anemia.  Past Medical History  Diagnosis Date  . Bursitis of left hip   . Personal history of colonic polyps     Adenomatous   . GERD (gastroesophageal reflux disease)   . Dysphagia   . Iron deficiency anemia   . Hyperlipemia   . Esophagitis   . Hemorrhoids   . Arthritis   . Diverticular disease   . Hypertension     History   Social History  . Marital Status: Married    Spouse Name: N/A  . Number of Children: N/A  . Years of Education: N/A   Occupational History  . RN    Social History Main Topics  . Smoking status: Never Smoker   . Smokeless tobacco: Never Used  . Alcohol Use: No  . Drug Use: No  . Sexual Activity: Not on file   Other Topics Concern  . Not on file   Social History Narrative   No regular exercise    Past Surgical History  Procedure Laterality Date  . Cesarean section      x 2   . Nasoseptoplasty    . Temporomandibular joint surgery  1989    Family History  Problem Relation Age of Onset  . Colon cancer Neg Hx   . Prostate cancer Father   . Cancer Father     Laryngeal  . Diabetes Maternal Aunt   . Hypertension Mother   . Hyperlipidemia Mother   . Other Mother     varicose veins    Allergies  Allergen Reactions  . Cefazolin   . Sulfonamide  Derivatives     Current Outpatient Prescriptions on File Prior to Visit  Medication Sig Dispense Refill  . aspirin 81 MG tablet Take 81 mg by mouth daily.    . cetirizine (ZYRTEC) 10 MG tablet Take 10 mg by mouth as needed.     No current facility-administered medications on file prior to visit.    BP 146/90 mmHg  Pulse 85  Temp(Src) 97.9 F (36.6 C) (Oral)  Resp 18  Ht 5' (1.524 m)  Wt 99 lb (44.906 kg)  BMI 19.33 kg/m2  SpO2 98%  LMP 12/22/2011      Review of Systems  Constitutional: Positive for fatigue.  HENT: Negative for congestion, dental problem, hearing loss, rhinorrhea, sinus pressure, sore throat and tinnitus.   Eyes: Negative for pain, discharge and visual disturbance.  Respiratory: Negative for cough and shortness of breath.   Cardiovascular: Negative for chest pain, palpitations and leg swelling.  Gastrointestinal: Negative for nausea, vomiting, abdominal pain, diarrhea, constipation, blood in stool and abdominal distention.  Genitourinary: Negative for dysuria, urgency, frequency, hematuria, flank pain, vaginal bleeding, vaginal discharge, difficulty urinating, vaginal  pain and pelvic pain.  Musculoskeletal: Negative for joint swelling, arthralgias and gait problem.  Skin: Negative for rash.  Neurological: Negative for dizziness, syncope, speech difficulty, weakness, numbness and headaches.  Hematological: Negative for adenopathy.  Psychiatric/Behavioral: Positive for sleep disturbance. Negative for behavioral problems, dysphoric mood and agitation. The patient is nervous/anxious.        Objective:   Physical Exam  Constitutional: She is oriented to person, place, and time. She appears well-developed and well-nourished.  HENT:  Head: Normocephalic.  Right Ear: External ear normal.  Left Ear: External ear normal.  Mouth/Throat: Oropharynx is clear and moist.  Eyes: Conjunctivae and EOM are normal. Pupils are equal, round, and reactive to light.  Neck:  Normal range of motion. Neck supple. No thyromegaly present.  Cardiovascular: Normal rate, regular rhythm, normal heart sounds and intact distal pulses.   Pulmonary/Chest: Effort normal and breath sounds normal.  Abdominal: Soft. Bowel sounds are normal. She exhibits no mass. There is no tenderness.  Musculoskeletal: Normal range of motion.  Lymphadenopathy:    She has no cervical adenopathy.  Neurological: She is alert and oriented to person, place, and time.  Skin: Skin is warm and dry. No rash noted.  Psychiatric: She has a normal mood and affect. Her behavior is normal.          Assessment & Plan:   Fatigue, insomnia Modest weight loss History colonic polyps Fibromuscular dysplasia of the carotid arteries  Clinical exam is unremarkable.  We'll check updated lab including lipid panel Suspect insomnia and multiple stressors plan a role No dramatic change in her weight We'll follow closely clinically

## 2014-12-18 NOTE — Progress Notes (Signed)
Pre visit review using our clinic review tool, if applicable. No additional management support is needed unless otherwise documented below in the visit note. 

## 2015-07-31 ENCOUNTER — Encounter: Payer: Self-pay | Admitting: Gastroenterology

## 2015-08-20 ENCOUNTER — Emergency Department (HOSPITAL_COMMUNITY): Payer: 59

## 2015-08-20 ENCOUNTER — Telehealth: Payer: Self-pay

## 2015-08-20 ENCOUNTER — Encounter (HOSPITAL_COMMUNITY): Payer: Self-pay

## 2015-08-20 ENCOUNTER — Inpatient Hospital Stay (HOSPITAL_COMMUNITY)
Admission: EM | Admit: 2015-08-20 | Discharge: 2015-08-23 | DRG: 069 | Disposition: A | Discharge status: Home / Self Care | Payer: 59 | Attending: Internal Medicine | Admitting: Internal Medicine

## 2015-08-20 DIAGNOSIS — K219 Gastro-esophageal reflux disease without esophagitis: Secondary | ICD-10-CM | POA: Diagnosis present

## 2015-08-20 DIAGNOSIS — R634 Abnormal weight loss: Secondary | ICD-10-CM | POA: Diagnosis present

## 2015-08-20 DIAGNOSIS — I6521 Occlusion and stenosis of right carotid artery: Secondary | ICD-10-CM | POA: Diagnosis not present

## 2015-08-20 DIAGNOSIS — I773 Arterial fibromuscular dysplasia: Secondary | ICD-10-CM | POA: Diagnosis present

## 2015-08-20 DIAGNOSIS — M8508 Fibrous dysplasia (monostotic), other site: Secondary | ICD-10-CM | POA: Diagnosis not present

## 2015-08-20 DIAGNOSIS — E785 Hyperlipidemia, unspecified: Secondary | ICD-10-CM | POA: Diagnosis present

## 2015-08-20 DIAGNOSIS — M549 Dorsalgia, unspecified: Secondary | ICD-10-CM | POA: Diagnosis not present

## 2015-08-20 DIAGNOSIS — I10 Essential (primary) hypertension: Secondary | ICD-10-CM | POA: Diagnosis present

## 2015-08-20 DIAGNOSIS — I6523 Occlusion and stenosis of bilateral carotid arteries: Secondary | ICD-10-CM | POA: Diagnosis not present

## 2015-08-20 DIAGNOSIS — Z881 Allergy status to other antibiotic agents status: Secondary | ICD-10-CM | POA: Diagnosis not present

## 2015-08-20 DIAGNOSIS — M6289 Other specified disorders of muscle: Secondary | ICD-10-CM | POA: Diagnosis not present

## 2015-08-20 DIAGNOSIS — M546 Pain in thoracic spine: Secondary | ICD-10-CM | POA: Diagnosis not present

## 2015-08-20 DIAGNOSIS — H538 Other visual disturbances: Secondary | ICD-10-CM | POA: Diagnosis present

## 2015-08-20 DIAGNOSIS — Z882 Allergy status to sulfonamides status: Secondary | ICD-10-CM

## 2015-08-20 DIAGNOSIS — K148 Other diseases of tongue: Secondary | ICD-10-CM | POA: Diagnosis present

## 2015-08-20 DIAGNOSIS — Z7982 Long term (current) use of aspirin: Secondary | ICD-10-CM | POA: Diagnosis not present

## 2015-08-20 DIAGNOSIS — G459 Transient cerebral ischemic attack, unspecified: Secondary | ICD-10-CM | POA: Diagnosis not present

## 2015-08-20 DIAGNOSIS — R42 Dizziness and giddiness: Secondary | ICD-10-CM | POA: Diagnosis not present

## 2015-08-20 DIAGNOSIS — Z79899 Other long term (current) drug therapy: Secondary | ICD-10-CM

## 2015-08-20 DIAGNOSIS — H5712 Ocular pain, left eye: Secondary | ICD-10-CM | POA: Diagnosis present

## 2015-08-20 DIAGNOSIS — R200 Anesthesia of skin: Secondary | ICD-10-CM | POA: Diagnosis present

## 2015-08-20 DIAGNOSIS — R531 Weakness: Secondary | ICD-10-CM | POA: Diagnosis present

## 2015-08-20 HISTORY — DX: Arterial fibromuscular dysplasia: I77.3

## 2015-08-20 LAB — I-STAT TROPONIN, ED: Troponin i, poc: 0 ng/mL (ref 0.00–0.08)

## 2015-08-20 LAB — COMPREHENSIVE METABOLIC PANEL
ALK PHOS: 82 U/L (ref 38–126)
ALT: 22 U/L (ref 14–54)
AST: 32 U/L (ref 15–41)
Albumin: 5.3 g/dL — ABNORMAL HIGH (ref 3.5–5.0)
Anion gap: 12 (ref 5–15)
BILIRUBIN TOTAL: 0.9 mg/dL (ref 0.3–1.2)
BUN: 11 mg/dL (ref 6–20)
CO2: 28 mmol/L (ref 22–32)
CREATININE: 0.82 mg/dL (ref 0.44–1.00)
Calcium: 9.8 mg/dL (ref 8.9–10.3)
Chloride: 104 mmol/L (ref 101–111)
GFR calc Af Amer: >60 mL/min (ref >60)
GFR calc non Af Amer: >60 mL/min (ref >60)
GLUCOSE: 87 mg/dL (ref 65–99)
Potassium: 3.7 mmol/L (ref 3.5–5.1)
Sodium: 144 mmol/L (ref 135–145)
TOTAL PROTEIN: 8.1 g/dL (ref 6.5–8.1)

## 2015-08-20 LAB — I-STAT CHEM 8, ED
BUN: 10 mg/dL (ref 6–20)
CALCIUM ION: 1.16 mmol/L (ref 1.12–1.23)
CHLORIDE: 105 mmol/L (ref 101–111)
CREATININE: 0.8 mg/dL (ref 0.44–1.00)
Glucose, Bld: 83 mg/dL (ref 65–99)
HEMATOCRIT: 47 % — AB (ref 36.0–46.0)
Hemoglobin: 16 g/dL — ABNORMAL HIGH (ref 12.0–15.0)
Potassium: 3.7 mmol/L (ref 3.5–5.1)
Sodium: 144 mmol/L (ref 135–145)
TCO2: 27 mmol/L (ref 0–100)

## 2015-08-20 LAB — DIFFERENTIAL
BASOS ABS: 0 10*3/uL (ref 0.0–0.1)
Basophils Relative: 0 %
Eosinophils Absolute: 0.1 10*3/uL (ref 0.0–0.7)
Eosinophils Relative: 1 %
LYMPHS ABS: 1.6 10*3/uL (ref 0.7–4.0)
LYMPHS PCT: 31 %
MONOS PCT: 7 %
Monocytes Absolute: 0.3 10*3/uL (ref 0.1–1.0)
NEUTROS ABS: 3.2 10*3/uL (ref 1.7–7.7)
Neutrophils Relative %: 61 %

## 2015-08-20 LAB — CBG MONITORING, ED: Glucose-Capillary: 80 mg/dL (ref 65–99)

## 2015-08-20 LAB — APTT: aPTT: 34 seconds (ref 24–37)

## 2015-08-20 LAB — CBC
HEMATOCRIT: 43.8 % (ref 36.0–46.0)
Hemoglobin: 14 g/dL (ref 12.0–15.0)
MCH: 28.1 pg (ref 26.0–34.0)
MCHC: 32 g/dL (ref 30.0–36.0)
MCV: 88 fL (ref 78.0–100.0)
Platelets: 266 10*3/uL (ref 150–400)
RBC: 4.98 MIL/uL (ref 3.87–5.11)
RDW: 13.6 % (ref 11.5–15.5)
WBC: 5.2 10*3/uL (ref 4.0–10.5)

## 2015-08-20 LAB — PROTIME-INR
INR: 1.09 (ref 0.00–1.49)
PROTHROMBIN TIME: 14.3 s (ref 11.6–15.2)

## 2015-08-20 LAB — SEDIMENTATION RATE: SED RATE: 5 mm/h (ref 0–22)

## 2015-08-20 MED ORDER — SALINE SPRAY 0.65 % NA SOLN
2.0000 | NASAL | Status: DC | PRN
  Filled 2015-08-20: qty 44

## 2015-08-20 MED ORDER — ASPIRIN 325 MG PO TABS
325.0000 mg | ORAL_TABLET | Freq: Every day | ORAL | Status: DC
  Administered 2015-08-20 – 2015-08-23 (×3): 325 mg via ORAL
  Filled 2015-08-20 (×4): qty 1

## 2015-08-20 MED ORDER — ADULT MULTIVITAMIN W/MINERALS CH
1.0000 | ORAL_TABLET | Freq: Every day | ORAL | Status: DC
  Administered 2015-08-21 – 2015-08-23 (×3): 1 via ORAL
  Filled 2015-08-20 (×3): qty 1

## 2015-08-20 MED ORDER — IBUPROFEN 200 MG PO TABS: 400.0000 mg | ORAL_TABLET | Freq: Four times a day (QID) | ORAL | Status: DC | PRN

## 2015-08-20 MED ORDER — CALCIUM CARBONATE ANTACID 500 MG PO CHEW
1.0000 | CHEWABLE_TABLET | Freq: Two times a day (BID) | ORAL | Status: DC
  Administered 2015-08-20 – 2015-08-23 (×6): 200 mg via ORAL
  Filled 2015-08-20 (×6): qty 1

## 2015-08-20 MED ORDER — GADOBENATE DIMEGLUMINE 529 MG/ML IV SOLN
10.0000 mL | Freq: Once | INTRAVENOUS | Status: AC | PRN
  Administered 2015-08-20: 9 mL via INTRAVENOUS

## 2015-08-20 MED ORDER — ENOXAPARIN SODIUM 40 MG/0.4ML ~~LOC~~ SOLN
40.0000 mg | SUBCUTANEOUS | Status: DC
  Administered 2015-08-20: 40 mg via SUBCUTANEOUS
  Filled 2015-08-20: qty 0.4

## 2015-08-20 MED ORDER — SODIUM CHLORIDE 0.9 % IJ SOLN
3.0000 mL | Freq: Two times a day (BID) | INTRAMUSCULAR | Status: DC
Start: 2015-08-20 — End: 2015-08-23
  Administered 2015-08-21 – 2015-08-23 (×5): 3 mL via INTRAVENOUS

## 2015-08-20 MED ORDER — LORATADINE 10 MG PO TABS
10.0000 mg | ORAL_TABLET | Freq: Every day | ORAL | Status: DC
  Administered 2015-08-21 – 2015-08-23 (×3): 10 mg via ORAL
  Filled 2015-08-20 (×3): qty 1

## 2015-08-20 NOTE — Telephone Encounter (Signed)
Phone call from pt. Reported onset of blurring of vision (L) eye, and light headed feeling on Saturday, 1/14.  Stated on Sunday, she felt fine.  Then, reported on Monday, had episode while driving, of feeling dizzy, with sensation she was listing to the left, and had blurring of vision left eye.  Stated today, she continues to have "slightly blurred vision in left eye."  Also c/o "throbbing in the left eye."  Stated she has not noticed any facial droop, numbness or unilateral weakness.  Discussed with Dr. Hart Rochester.  Advised pt. should go to her eye doctor to determine if there is any pathology in the left eye; if emboli noted, then work in for carotid duplex and office exam.  Notified pt. Of Dr. Candie Chroman recommendations.  Verb. Understanding.  Stated she will contact her husband and have him drive her to the eye doctor.

## 2015-08-20 NOTE — ED Notes (Addendum)
Pt c/o intermittent dizziness/lightheadedness x 3 days, L eye blurred vision since last night, and L side facial numbness since 1145.  Tongue deviates slightly to the L when stuck out.  Denies pain.  Facial symmetry noted.  Denies weakness.  Hx fibromuscular dysplasia of R carotid artery.  Sts neuro check-up last year was "good."

## 2015-08-20 NOTE — ED Notes (Signed)
Transport requested. Pt updated.

## 2015-08-20 NOTE — ED Notes (Signed)
Ambulated to the BR with steady gait

## 2015-08-20 NOTE — ED Notes (Signed)
Attempted to call report. Asked to call back in 15 mins.

## 2015-08-20 NOTE — H&P (Signed)
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 She also has persistent blurred vision as well as throbbing behind her left eye, she presented to [facility] ED, Ct head no acute findings,  Basic labs unremarkable, neurology consulted, ordered MRI/MRA and requested patient to be admitted to [place], hospitalist paged to admit the patient. She denies fever, no tinnitus, no n/v. No muscle weakness in extremities. Reported significant weight loss due to stress. Patient reported used to take blood pressure meds, but was taken off due to not need it anymore.    Review of Systems:  Detail per HPI, Review of systems are otherwise negative  Past Medical History  Diagnosis Date  . Bursitis of left hip   . Personal history of colonic polyps     Adenomatous   . GERD (gastroesophageal reflux disease)   . Dysphagia   . Iron deficiency anemia   . Hyperlipemia   . Esophagitis   . Hemorrhoids   . Arthritis   . Hypertension   . Fibromuscular dysplasia [facility])    Past Surgical History  Procedure Laterality Date  . Cesarean section      x 2   . Nasoseptoplasty    . Temporomandibular joint surgery  1989   Social History:  reports that she has never smoked. She has never used smokeless tobacco. She reports that she does not drink alcohol or use illicit drugs. Patient lives at home & is able to participate in activities of daily living independently   Allergies  Allergen Reactions  . Cefazolin Hives and Itching  . Sulfonamide Derivatives Hives and Itching     Family History  Problem Relation Age of Onset  . Colon cancer Neg Hx   . Prostate cancer Father   . Cancer Father     Laryngeal  . Diabetes Maternal Aunt   . Hypertension Mother   . Hyperlipidemia Mother   . Other Mother     varicose veins    Reported her mother had stroke last year, her sister has HTN.  Prior to Admission medications   Medication Sig Start Date End Date Taking? Authorizing Provider  aspirin 81 MG tablet Take 81 mg by mouth daily.   Yes Historical Provider, MD  calcium carbonate (TUMS - DOSED IN MG ELEMENTAL CALCIUM) 500 MG chewable tablet Chew 1 tablet by mouth 2 (two) times daily.   Yes Historical Provider, MD  cetirizine (ZYRTEC) 10 MG tablet Take 10 mg by mouth as needed for allergies.    Yes Historical Provider, MD  ibuprofen (ADVIL,MOTRIN) 200 MG tablet Take 400 mg by mouth every 6 (six) hours as needed for headache or moderate pain.   Yes Historical Provider, MD  Multiple Vitamin (MULTIVITAMIN WITH MINERALS) TABS tablet Take 1 tablet by mouth daily.   Yes Historical Provider, MD  sodium chloride (OCEAN) 0.65 % SOLN nasal spray Place 2 sprays into both nostrils as needed for congestion.   Yes Historical Provider, MD    Physical Exam: BP 175/90 mmHg  Pulse 75  Temp(Src) 98.2 F (36.8 C) (Oral)  Resp  18  SpO2 100%  LMP 12/22/2011  General:  Thin, NAD, pleasant Eyes: PERRL, no conjunctiva erythema ENT: unremarkable Neck: supple, no JVD Cardiovascular: RRR Respiratory: CTABL Abdomen: soft/ND/ND, positive bowel sounds Skin: no rash Musculoskeletal:  No edema Psychiatric: calm/cooperative Neurologic: no focal findings           Labs on Admission:  Basic Metabolic Panel:  Recent Labs Lab 08/20/15 1247 08/20/15 1304  NA 144 144  K 3.7 3.7  CL 104 105  CO2 28  --   GLUCOSE 87 83  BUN 11 10  CREATININE 0.82 0.80  CALCIUM 9.8  --    Liver Function Tests:  Recent Labs Lab 08/20/15 1247  AST 32  ALT 22  ALKPHOS 82  BILITOT 0.9   PROT 8.1  ALBUMIN 5.3*   No results for input(s): LIPASE, AMYLASE in the last 168 hours. No results for input(s): AMMONIA in the last 168 hours. CBC:  Recent Labs Lab 08/20/15 1247 08/20/15 1304  WBC 5.2  --   NEUTROABS 3.2  --   HGB 14.0 16.0*  HCT 43.8 47.0*  MCV 88.0  --   PLT 266  --    Cardiac Enzymes: No results for input(s): CKTOTAL, CKMB, CKMBINDEX, TROPONINI in the last 168 hours.  BNP (last 3 results) No results for input(s): BNP in the last 8760 hours.  ProBNP (last 3 results) No results for input(s): PROBNP in the last 8760 hours.  CBG:  Recent Labs Lab 08/20/15 1238  GLUCAP 80    Radiological Exams on Admission: Ct Head Wo Contrast  08/20/2015  CLINICAL DATA:  Intermittent dizziness for 3 days. Blurred vision left eye. Facial numbness for 1 day EXAM: CT HEAD WITHOUT CONTRAST TECHNIQUE: Contiguous axial images were obtained from the base of the skull through the vertex without intravenous contrast. COMPARISON:  Brain MRI January 16, 2012 FINDINGS: The ventricles are normal in size and configuration. Borderline parietal lobe atrophy is stable. There is no intracranial mass, hemorrhage, extra-axial fluid collection, or midline shift. Gray-white compartments appear normal. No acute infarct evident. Bony calvarium appears intact. Mastoid air cells are clear. No intraorbital lesions are demonstrable. A punctate calcification in the right basal ganglia is likely physiologic. IMPRESSION: No intracranial mass, hemorrhage, or focal gray -white compartment lesions/acute appearing infarct. Electronically Signed   By: Lowella Grip III M.D.   On: 08/20/2015 13:37    EKG: Independently reviewed. NSR, no acute ST/T changes  Assessment/Plan Present on Admission:  **None**  TIA? Left sided numbness/tonue deviation now has largely resolved, persistent blurry vision and throbbing pain behind left eye, though nontender on palpation to left temporal forehead, Ct head no acute  findings,. Mri brain /MRA head neck pending, neurology consulted , recommended asa 325 for now , lipid panel echocardiogram ordered, patient is admit to [place] tele. Will check esr/crp.  HTN; patient has not been on meds for a while. For now allow permissive HTN, will likely need to be discharged on bp meds.  H/o carotid fibromuscular dysplasia, MRA neck pending. Had been followed by neurology and vascular surgery.   DVT prophylaxis: lovenox  Consultants:  neurology  Code Status: full   Family Communication:  Patient and husband in room,   Disposition Plan: admit to [facility] tele  Time spent: 39mns  Marieta Markov MD, PhD [facility] Pager [phone] 7PM-7AM, please contact night-coverage at [email or web address], password [facility]

## 2015-08-20 NOTE — ED Notes (Signed)
Report given to [facility]. ETA 5 mins.

## 2015-08-20 NOTE — Consult Note (Signed)
Neurology Consultation Reason for Consult: Left-sided numbness Referring Physician: Daryll Brod, C  CC: Left-sided numbness  History is obtained from: Patient  HPI: Cheryl [name] is a 55 y.o. female with a history of fibromuscular dysplasia who presents with transient left-sided numbness and tongue deviation. She also has persistent blurred vision as well as throbbing behind her left eye. She has noted multiple episodes over the past few days of blurred vision in her left eye as well as dizziness. She states that the sensation as if she is falling to the left. Today, she had throbbing in the left eye and has had persistent blurred vision in the lateral aspect of the night. She closes her left eye her right eye has intact vision.  The numbness and tongue deviation have resolved, but she left with persistent left eye blurred vision.  LKW: Unclear given multiple episodes of recurrent symptoms. tpa given?: no, unclear time of onset    ROS: A 14 point ROS was performed and is negative except as noted in the HPI.   Past Medical History  Diagnosis Date  . Bursitis of left hip   . Personal history of colonic polyps     Adenomatous   . GERD (gastroesophageal reflux disease)   . Dysphagia   . Iron deficiency anemia   . Hyperlipemia   . Esophagitis   . Hemorrhoids   . Arthritis   . Hypertension   . Fibromuscular dysplasia (HCC)      Family History  Problem Relation Age of Onset  . Colon cancer Neg Hx   . Prostate cancer Father   . Cancer Father     Laryngeal  . Diabetes Maternal Aunt   . Hypertension Mother   . Hyperlipidemia Mother   . Other Mother     varicose veins     Social History:  reports that she has never smoked. She has never used smokeless tobacco. She reports that she does not drink alcohol or use illicit drugs.   Exam: Current vital signs: BP 164/101 mmHg  Pulse 76  Temp(Src) 98.2 F (36.8 C) (Oral)  Resp 17  SpO2 100%  LMP 12/22/2011 Vital signs in  last 24 hours: Temp:  [97.9 F (36.6 C)-98.2 F (36.8 C)] 98.2 F (36.8 C) (01/17 1530) Pulse Rate:  [76-83] 76 (01/17 1530) Resp:  [14-17] 17 (01/17 1530) BP: (155-173)/(98-102) 164/101 mmHg (01/17 1530) SpO2:  [100 %] 100 % (01/17 1530)   Physical Exam  Constitutional: Appears well-developed and well-nourished.  Psych: Affect appropriate to situation Eyes: No scleral injection HENT: No OP obstrucion Head: Normocephalic.  Cardiovascular: Normal rate and regular rhythm.  Respiratory: Effort normal  GI: Soft.  No distension. There is no tenderness.  Skin: WDI Extremities: Pulses intact distally  Neuro: Mental Status: Patient is awake, alert, oriented to person, place, month, year, and situation. Patient is able to give a clear and coherent history. No signs of aphasia or neglect Cranial Nerves: II: Visual Fields are full. Pupils are equal, round, and reactive to light.   III,IV, VI: EOMI without ptosis or diploplia.  V: Facial sensation is symmetric to temperature VII: Facial movement is symmetric.  VIII: hearing is intact to voice X: Uvula elevates symmetrically XI: Shoulder shrug is symmetric. XII: tongue is midline without atrophy or fasciculations.  Motor: Tone is normal. Bulk is normal. 5/5 strength was present in all four extremities.  Sensory: Sensation is symmetric to light touch and temperature in the arms and legs. Deep Tendon Reflexes: 2+ and  symmetric in the biceps and patellae.  Plantars: Toes are downgoing bilaterally.  Cerebellar: FNF intact bilaterally    I have reviewed labs in epic and the results pertinent to this consultation are: CMP-unremarkable  I have reviewed the images obtained: CT head-unremarkable  Impression: 55 year old female with transient left facial numbness and tongue deviation as well as persistent blurred vision. I am concerned about posterior circulation TIA and/or stroke. She has multiple risk factors including  hyperlipidemia, hypertension, fibromuscular dysplasia I would favor admission for further workup. She only takes aspirin intermittently and will continue this for now.  Recommendations: 1. HgbA1c, fasting lipid panel 2. MRI, MRA  of the brain without contrast, MRA neck with contrast 3. Frequent neuro checks 4. Echocardiogram 5. Carotid dopplers are not needed given MRA neck 6. Prophylactic therapy-Antiplatelet med: Aspirin - dose  PO or  PR 7. Risk factor modification 8. Telemetry monitoring   Ritta Slot, MD [facility] [phone]  If 7pm- 7am, please page neurology on call as listed in AMION.

## 2015-08-20 NOTE — ED Provider Notes (Signed)
CSN: [id number]     Arrival date & time 08/20/15  1210 History   First MD Initiated Contact with Patient 08/20/15 1236     Chief Complaint  Patient presents with  . Blurred Vision  . Facial Numbness      (Consider location/radiation/quality/duration/timing/severity/associated sxs/prior Treatment) HPI   Patient is a 55 year old female with history of fibromuscular dysplasia presenting with neurologic symptoms. Patient has had a couple episodes of dizziness over the course of last couple days. This is non-positional dizziness. Then today patient developed some blurriness in her left eye which is not completely resolved. It is waxing and waning. In addition she had some numbness in her left cheek which is waxing and waning.  Patient has had serial carotid ultrasounds as been followed by both neurology and [facility] for the fibromuscular dysplasia.  Past Medical History  Diagnosis Date  . Bursitis of left hip   . Personal history of colonic polyps     Adenomatous   . GERD (gastroesophageal reflux disease)   . Dysphagia   . Iron deficiency anemia   . Hyperlipemia   . Esophagitis   . Hemorrhoids   . Arthritis   . Hypertension   . Fibromuscular dysplasia [facility])    Past Surgical History  Procedure Laterality Date  . Cesarean section      x 2   . Nasoseptoplasty    . Temporomandibular joint surgery  1989   Family History  Problem Relation Age of Onset  . Colon cancer Neg Hx   . Prostate cancer Father   . Cancer Father     Laryngeal  . Diabetes Maternal Aunt   . Hypertension Mother   . Hyperlipidemia Mother   . Other Mother     varicose veins   Social History  Substance Use Topics  . Smoking status: Never Smoker   . Smokeless tobacco: Never Used  . Alcohol Use: No   OB History    No data available     Review of Systems  Constitutional: Negative for activity change and fatigue.  HENT: Negative for congestion.   Eyes: Positive for visual disturbance.   Respiratory: Negative for cough.   Cardiovascular: Negative for chest pain.  Gastrointestinal: Negative for abdominal distention.  Genitourinary: Negative for dysuria.  Musculoskeletal: Negative for joint swelling.  Skin: Negative for rash.  Allergic/Immunologic: Negative for immunocompromised state.  Neurological: Positive for dizziness. Negative for syncope, speech difficulty and weakness.  Psychiatric/Behavioral: Negative for agitation.      Allergies  Cefazolin and Sulfonamide derivatives  Home Medications   Prior to Admission medications   Medication Sig Start Date End Date Taking? Authorizing Provider  aspirin 81 MG tablet Take 81 mg by mouth daily.   Yes Historical Provider, MD  calcium carbonate (TUMS - DOSED IN MG ELEMENTAL CALCIUM) 500 MG chewable tablet Chew 1 tablet by mouth 2 (two) times daily.   Yes Historical Provider, MD  cetirizine (ZYRTEC) 10 MG tablet Take 10 mg by mouth as needed for allergies.    Yes Historical Provider, MD  ibuprofen (ADVIL,MOTRIN) 200 MG tablet Take 400 mg by mouth every 6 (six) hours as needed for headache or moderate pain.   Yes Historical Provider, MD  Multiple Vitamin (MULTIVITAMIN WITH MINERALS) TABS tablet Take 1 tablet by mouth daily.   Yes Historical Provider, MD  sodium chloride (OCEAN) 0.65 % SOLN nasal spray Place 2 sprays into both nostrils as needed for congestion.   Yes Historical Provider, MD   BP 164/101  mmHg  Pulse 76  Temp(Src) 98.2 F (36.8 C) (Oral)  Resp 17  SpO2 100%  LMP 12/22/2011 Physical Exam  Constitutional: She is oriented to person, place, and time. She appears well-developed and well-nourished.  HENT:  Head: Normocephalic and atraumatic.  Eyes: Conjunctivae are normal. Right eye exhibits no discharge.  Neck: Neck supple.  Cardiovascular: Normal rate, regular rhythm and normal heart sounds.   No murmur heard. Pulmonary/Chest: Effort normal and breath sounds normal. She has no wheezes. She has no rales.   Abdominal: Soft. She exhibits no distension. There is no tenderness.  Musculoskeletal: Normal range of motion. She exhibits no edema.  Neurological: She is oriented to person, place, and time. No cranial nerve deficit.  She has no facial asymmetry. Equal grip strength equal leg strength. Finger to nose intact.  Sensation intact diffusely.  Pupils equal round responsive.  Skin: Skin is warm and dry. No rash noted. She is not diaphoretic.  Psychiatric: She has a normal mood and affect. Her behavior is normal.  Nursing note and vitals reviewed.   ED Course  Procedures (including critical care time) Labs Review Labs Reviewed  COMPREHENSIVE METABOLIC PANEL - Abnormal; Notable for the following:    Albumin 5.3 (*)    All other components within normal limits  I-STAT CHEM 8, ED - Abnormal; Notable for the following:    Hemoglobin 16.0 (*)    HCT 47.0 (*)    All other components within normal limits  PROTIME-INR  APTT  CBC  DIFFERENTIAL  I-STAT TROPOININ, ED  CBG MONITORING, ED    Imaging Review Ct Head Wo Contrast  08/20/2015  CLINICAL DATA:  Intermittent dizziness for 3 days. Blurred vision left eye. Facial numbness for 1 day EXAM: CT HEAD WITHOUT CONTRAST TECHNIQUE: Contiguous axial images were obtained from the base of the skull through the vertex without intravenous contrast. COMPARISON:  Brain MRI January 16, 2012 FINDINGS: The ventricles are normal in size and configuration. Borderline parietal lobe atrophy is stable. There is no intracranial mass, hemorrhage, extra-axial fluid collection, or midline shift. Gray-white compartments appear normal. No acute infarct evident. Bony calvarium appears intact. Mastoid air cells are clear. No intraorbital lesions are demonstrable. A punctate calcification in the right basal ganglia is likely physiologic. IMPRESSION: No intracranial mass, hemorrhage, or focal gray -white compartment lesions/acute appearing infarct. Electronically Signed   By:  Bretta Bang III M.D.   On: 08/20/2015 13:37   I have personally reviewed and evaluated these images and lab results as part of my medical decision-making.   EKG Interpretation None      MDM   Final diagnoses:  None    Patient is a 55 year old female with history of fibromuscular dysplasia. 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 Patient reports that she's had intermittent dizziness over the last couple days. In addition patient started today with some visual changes in her left eye. In addition she has noted some numbness in her left cheek.  I will talk to neurology because I'm unfamiliar with fibromuscular dysplasia and how would it could present. However patient has had to have serial carotid ultrasounds in the past. Concern for some type of non-atherosclerotic blood vessel disease causing intermittent neurologic symptoms in this female.  4:04 PM Just discussed with neurology, will get MRA neck w and then MR MRA head wo. Neurology will come see patient.    Courteney Randall An, MD 08/20/15 [phone]

## 2015-08-20 NOTE — ED Notes (Signed)
UNABLE TO COLLECT LABS AT THIS TIME PATIENT IS TALKING WITH MD. 

## 2015-08-20 NOTE — ED Notes (Signed)
Pt reports dizziness and vision change in her L eye since yesterday.  States that at some point her balance was off, leaning to her Left when ambulating.  Today while at work, pt started to have tingling in the L side of her face.  Pt reports one of her co-workers asked her to stick out her tongue and it was deviating to the left.  Pt is ambulatory with steady gait, A&Ox 4, no slurred speech or facial droop at this time.  No other neuro deficits noted.

## 2015-08-20 NOTE — ED Notes (Signed)
Family at bedside. 

## 2015-08-20 NOTE — ED Notes (Signed)
Bed: WA07 Expected date:  Expected time:  Means of arrival:  Comments: Employee from [employer]

## 2015-08-21 ENCOUNTER — Inpatient Hospital Stay (HOSPITAL_COMMUNITY): Payer: 59

## 2015-08-21 ENCOUNTER — Encounter (HOSPITAL_COMMUNITY): Payer: Self-pay | Admitting: Physician Assistant

## 2015-08-21 DIAGNOSIS — M6289 Other specified disorders of muscle: Secondary | ICD-10-CM

## 2015-08-21 DIAGNOSIS — M546 Pain in thoracic spine: Secondary | ICD-10-CM

## 2015-08-21 DIAGNOSIS — G459 Transient cerebral ischemic attack, unspecified: Secondary | ICD-10-CM

## 2015-08-21 DIAGNOSIS — I10 Essential (primary) hypertension: Secondary | ICD-10-CM

## 2015-08-21 LAB — LIPID PANEL
Cholesterol: 284 mg/dL — ABNORMAL HIGH (ref 0–200)
HDL: 68 mg/dL (ref >40)
LDL CALC: 205 mg/dL — AB (ref 0–99)
Total CHOL/HDL Ratio: 4.2 RATIO
Triglycerides: 56 mg/dL (ref <150)
VLDL: 11 mg/dL (ref 0–40)

## 2015-08-21 LAB — BASIC METABOLIC PANEL
ANION GAP: 8 (ref 5–15)
BUN: 12 mg/dL (ref 6–20)
CALCIUM: 9.6 mg/dL (ref 8.9–10.3)
CO2: 29 mmol/L (ref 22–32)
Chloride: 104 mmol/L (ref 101–111)
Creatinine, Ser: 1 mg/dL (ref 0.44–1.00)
GFR calc Af Amer: >60 mL/min (ref >60)
GFR calc non Af Amer: >60 mL/min (ref >60)
GLUCOSE: 87 mg/dL (ref 65–99)
Potassium: 4 mmol/L (ref 3.5–5.1)
Sodium: 141 mmol/L (ref 135–145)

## 2015-08-21 LAB — CBC
HEMATOCRIT: 43.8 % (ref 36.0–46.0)
Hemoglobin: 14.3 g/dL (ref 12.0–15.0)
MCH: 28.4 pg (ref 26.0–34.0)
MCHC: 32.6 g/dL (ref 30.0–36.0)
MCV: 86.9 fL (ref 78.0–100.0)
Platelets: 240 10*3/uL (ref 150–400)
RBC: 5.04 MIL/uL (ref 3.87–5.11)
RDW: 13.6 % (ref 11.5–15.5)
WBC: 5.6 10*3/uL (ref 4.0–10.5)

## 2015-08-21 LAB — C-REACTIVE PROTEIN: CRP: <0.5 mg/dL (ref <1.0)

## 2015-08-21 MED ORDER — ATORVASTATIN CALCIUM 10 MG PO TABS: 20.0000 mg | ORAL_TABLET | Freq: Every day | ORAL | Status: DC

## 2015-08-21 MED ORDER — ATORVASTATIN CALCIUM 40 MG PO TABS
40.0000 mg | ORAL_TABLET | Freq: Every day | ORAL | Status: DC
  Administered 2015-08-21 – 2015-08-22 (×2): 40 mg via ORAL
  Filled 2015-08-21 (×2): qty 1

## 2015-08-21 MED ORDER — ATORVASTATIN CALCIUM 40 MG PO TABS: 40.0000 mg | ORAL_TABLET | Freq: Every day | ORAL | Status: DC

## 2015-08-21 MED ORDER — BOOST PLUS PO LIQD
237.0000 mL | Freq: Two times a day (BID) | ORAL | Status: DC
  Administered 2015-08-21 – 2015-08-23 (×4): 237 mL via ORAL
  Filled 2015-08-21 (×7): qty 237

## 2015-08-21 NOTE — Progress Notes (Signed)
Received Patient from  [facility] via stretcher escorted  by [facility] personnel and pt,s  Husband pt alert oriented x 4 verbally communicable  Pupils reactive Denied any neuro symptons now . Left side of face and eyes numbness and tingling has resolve,. Patient remained neuro intact all night . Vs stable .no c/o voiced this am.

## 2015-08-21 NOTE — Progress Notes (Signed)
STROKE TEAM PROGRESS NOTE   HISTORY Cheryl [name] is a 55 y.o. female with a history of fibromuscular dysplasia who presents with transient left-sided numbness and tongue deviation. She also has persistent blurred vision as well as throbbing behind her left eye. She has noted multiple episodes over the past few days of blurred vision in her left eye as well as dizziness. She states that the sensation as if she is falling to the left. Today, she had throbbing in the left eye and has had persistent blurred vision in the lateral aspect of the night. She closes her left eye her right eye has intact vision. The numbness and tongue deviation have resolved, but she left with persistent left eye blurred vision. LKW was unclear given multiple episodes of recurrent symptoms. Patient was not administered TPA secondary to unclear time of onset. She was admitted for further evaluation and treatment.   SUBJECTIVE (INTERVAL HISTORY) No family is at the bedside.  Overall she feels her condition is stable. She recounted a feeling of falling to the L while driving that just lasted a few minutes, had similar episode while sitting in the chair on Sat for just a few seconds. Tongue to the L when she tested herself. Husband arrived during our rounds. Complains of mild HA.    OBJECTIVE Temp:  [97.9 F (36.6 C)-98.2 F (36.8 C)] 97.9 F (36.6 C) (01/18 1325) Pulse Rate:  [60-84] 71 (01/18 1325) Cardiac Rhythm:  [-] Normal sinus rhythm (01/18 1135) Resp:  [11-23] 18 (01/18 1325) BP: (111-182)/(65-102) 130/66 mmHg (01/18 1325) SpO2:  [97 %-100 %] 100 % (01/18 1325) Weight:  [44.679 kg (98 lb 8 oz)] 44.679 kg (98 lb 8 oz) (01/17 2337)  CBC:   Recent Labs Lab 08/20/15 1247 08/20/15 1304 08/21/15 0711  WBC 5.2  --  5.6  NEUTROABS 3.2  --   --   HGB 14.0 16.0* 14.3  HCT 43.8 47.0* 43.8  MCV 88.0  --  86.9  PLT 266  --  240    Basic Metabolic Panel:   Recent Labs Lab 08/20/15 1247 08/20/15 1304  08/21/15 0711  NA 144 144 141  K 3.7 3.7 4.0  CL 104 105 104  CO2 28  --  29  GLUCOSE 87 83 87  BUN CREATININE 0.82 0.80 1.00  CALCIUM 9.8  --  9.6    Lipid Panel:     Component Value Date/Time   CHOL 284* 08/21/2015 0717   TRIG 56 08/21/2015 0717   HDL 68 08/21/2015 0717   CHOLHDL 4.2 08/21/2015 0717   VLDL 11 08/21/2015 0717   LDLCALC 205* 08/21/2015 0717   HgbA1c: No results found for: HGBA1C Urine Drug Screen: No results found for: LABOPIA, COCAINSCRNUR, LABBENZ, AMPHETMU, THCU, LABBARB    IMAGING  Ct Head Wo Contrast 08/20/2015  No intracranial mass, hemorrhage, or focal gray -white compartment lesions/acute appearing infarct.   Mr Brain Wo Contrast 08/20/2015  1. No acute intracranial abnormality.   Mr Angiogram Head Wo Contrast 08/20/2015   Negative head MRA.   Mr Angiogram Neck W Wo Contrast 08/20/2015  Diffuse changes of fibromuscular dysplasia involving the cervical internal carotid arteries and vertebral arteries, mildly progressed from prior. Up to 60% stenosis of the right ICA.   2D Echocardiogram  - Left ventricle: The cavity size was normal. Wall thickness wasnormal. Systolic function was normal. The estimated ejectionfraction was in the range of 55% to 60%. Wall motion was normal;there were no  regional wall motion abnormalities. Dopplerparameters are consistent with abnormal left ventricularrelaxation (grade 1 diastolic dysfunction). - Impressions:  Normal LV systolic function; grade 1 diastolic dysfunction; traceMR and TR.    PHYSICAL EXAM Pleasant middle-aged African-American lady currently not in distress. . Afebrile. Head is nontraumatic. Neck is supple without bruit.    Cardiac exam no murmur or gallop. Lungs are clear to auscultation. Distal pulses are well felt. Neurological Exam ;  Awake  Alert oriented x 3. Normal speech and language.eye movements full without nystagmus.fundi were not visualized. Vision acuity and fields appear  normal. Hearing is normal. Palatal movements are normal. Face symmetric. Tongue midline. Normal strength, tone, reflexes and coordination. Normal sensation. Gait deferred. ASSESSMENT/PLAN Cheryl [name] is a 55 y.o. female with history of fibromuscular dysplasia presenting with transient left-sided numbness and tongue deviation. She did not receive IV t-PA due to unclear time of onset.   TIA FMD  MRI  No acute stroke  MRA  Unremarkable   MRA neck changes associated with fibromuscular dysplasia  Carotid Doppler  canceled  2D Echo  No source of embolus   Check cerebral angio to look at FMD, ? Percentage of blockage. No angio done in past 10 years. Ordered, hopefully can be done tomorrow  LDL 205  HgbA1c pending  Lovenox 40 mg sq daily for VTE prophylaxis Diet Heart Room service appropriate?: Yes; Fluid consistency:: Thin  aspirin 81 mg daily listed as prior to admission, however, pt states she was not taking, now on aspirin 325 mg daily. Needs to be continued at discharge  Patient counseled to be compliant with her antithrombotic medications  Ongoing aggressive stroke risk factor management  Recommend pt see an opthamologist to eval vision further given blurred vision and pain in her eye, ? clot  Therapy recommendations:  pending   Disposition:  Anticipate return home  Hypertensive Emergency  BP as high as 164/101, 155/102 on arrival yesterday  ? etiololgy of HTN. Has not had eval for RAS. Will ask Dr. Algis Downs to look at kidneys when he does cerebral angio  Hyperlipidemia  Home meds:  No statin  LDL 205, goal < 70  Started on lipitor 40  Continue statin at discharge  Hospital day # 1  [facility] See Amion for Pager information 08/21/2015 1:59 PM  I have personally examined this patient, reviewed notes, independently viewed imaging studies, participated in medical decision making and plan of care. I have made any additions or  clarifications directly to the above note. Agree with note above. She presented with left eye pain and blurred vision which appears to be improving and has a known history of fibromuscular dysplasia and likely had a left brain TIA versus retinal artery ischemia. She remains at risk for neurological worsening, recurrent stroke, TIA and needs ongoing evaluation. Recommend checking diagnostic cerebral catheter angiogram to evaluate her fibromuscular dysplasia as well as to check renal arteries for renal artery stenosis. Discussed with patient, husband and Dr.Deveshwar  Delia Heady, MD Medical Director [facility] Pager: [phone] 08/21/2015 5:33 PM    To contact Stroke Continuity provider, please refer to [email or web address]. After hours, contact General Neurology

## 2015-08-21 NOTE — H&P (Signed)
Chief Complaint:  Blurred vision left eye Left facial numbness Headache behind left eye  Referring Physician(s): Dr. Konrad Felix  History of Present Illness: ARIYANNAH [name] is a 55 y.o. female with a history of fibromuscular dysplasia who presented with transient numbness of the left side of her face and tongue deviation. She also has persistent blurred vision as well as throbbing behind her left eye.   She has noted multiple episodes over the past few days of blurred vision in her left eye as well as dizziness. She states that the sensation as if she is falling to the left.   Today, she still has some throbbing in the left eye but states she can now see clearly in the left eye.   The numbness and tongue deviation have resolved.   She also reported a feeling of falling to the L while driving a couple of nights ago that just lasted a few minutes/ She had similar episode while sitting in the chair on Sat for just a few seconds.   She also reports she has had FMD for many years and is followed by Vascular Surgery.  She has routine dopplers and states that since she had remained stable, they told her she could have her routine surveillance every other year.  She had not been on any blood thinners.  She was given a 325 aspirin last evening.  MRA showed no acute stroke, but does show FMD with 60% right ICA stenosis.  We are asked to evaluate her for Carotid/Vertebral arteriogram and renal arteriogram.  Past Medical History  Diagnosis Date  . Bursitis of left hip   . Personal history of colonic polyps     Adenomatous   . GERD (gastroesophageal reflux disease)   . Dysphagia   . Iron deficiency anemia   . Hyperlipemia   . Esophagitis   . Hemorrhoids   . Arthritis   . Hypertension   . Fibromuscular dysplasia [facility])     Past Surgical History  Procedure Laterality Date  . Cesarean section      x 2   . Nasoseptoplasty    . Temporomandibular joint surgery  1989     Allergies: Cefazolin and Sulfonamide derivatives  Medications: Prior to Admission medications   Medication Sig Start Date End Date Taking? Authorizing Provider  aspirin 81 MG tablet Take 81 mg by mouth daily.   Yes Historical Provider, MD  calcium carbonate (TUMS - DOSED IN MG ELEMENTAL CALCIUM) 500 MG chewable tablet Chew 1 tablet by mouth 2 (two) times daily.   Yes Historical Provider, MD  cetirizine (ZYRTEC) 10 MG tablet Take 10 mg by mouth as needed for allergies.    Yes Historical Provider, MD  ibuprofen (ADVIL,MOTRIN) 200 MG tablet Take 400 mg by mouth every 6 (six) hours as needed for headache or moderate pain.   Yes Historical Provider, MD  Multiple Vitamin (MULTIVITAMIN WITH MINERALS) TABS tablet Take 1 tablet by mouth daily.   Yes Historical Provider, MD  sodium chloride (OCEAN) 0.65 % SOLN nasal spray Place 2 sprays into both nostrils as needed for congestion.   Yes Historical Provider, MD  atorvastatin (LIPITOR) 40 MG tablet Take 1 tablet (40 mg total) by mouth at bedtime. 08/21/15   Cheryl Jenna Luo, MD     Family History  Problem Relation Age of Onset  . Colon cancer Neg Hx   . Prostate cancer Father   . Cancer Father     Laryngeal  . Diabetes  Maternal Aunt   . Hypertension Mother   . Hyperlipidemia Mother   . Other Mother     varicose veins    Social History   Social History  . Marital Status: Married    Spouse Name: N/A  . Number of Children: N/A  . Years of Education: N/A   Occupational History  . RN    Social History Main Topics  . Smoking status: Never Smoker   . Smokeless tobacco: Never Used  . Alcohol Use: No  . Drug Use: No  . Sexual Activity: Not Asked   Other Topics Concern  . None   Social History Narrative   No regular exercise    Review of Systems  Constitutional: Negative for fever, chills, activity change, appetite change and fatigue.  Respiratory: Negative for cough, chest tightness and wheezing.   Cardiovascular: Negative.   Negative for chest pain.  Gastrointestinal: Negative.   Genitourinary: Positive for frequency.  Musculoskeletal: Negative for back pain, arthralgias and gait problem.  Skin: Negative.   Neurological: Positive for numbness and headaches. Negative for syncope, facial asymmetry and speech difficulty.    Vital Signs: BP 130/66 mmHg  Pulse 71  Temp(Src) 97.9 F (36.6 C) (Oral)  Resp 18  Ht 5' (1.524 m)  Wt 98 lb 8 oz (44.679 kg)  BMI 19.24 kg/m2  SpO2 100%  LMP 12/22/2011  Physical Exam  Constitutional: She is oriented to person, place, and time. She appears well-developed and well-nourished.  HENT:  Head: Normocephalic and atraumatic.  Eyes: EOM are normal.  Cardiovascular: Normal rate, regular rhythm and normal heart sounds.   No murmur heard. Pulmonary/Chest: Effort normal and breath sounds normal. No respiratory distress. She has no wheezes.  Abdominal: Soft. Bowel sounds are normal.  Musculoskeletal: Normal range of motion.  Neurological: She is alert and oriented to person, place, and time. No cranial nerve deficit. Coordination normal.  Skin: Skin is warm and dry.  Psychiatric: She has a normal mood and affect. Her behavior is normal. Judgment and thought content normal.  Vitals reviewed.   Mallampati Score:  MD Evaluation Airway: WNL Heart: WNL Abdomen: WNL Chest/ Lungs: WNL ASA  Classification: 2 Mallampati/Airway Score: One  Imaging: Ct Head Wo Contrast  08/20/2015  CLINICAL DATA:  Intermittent dizziness for 3 days. Blurred vision left eye. Facial numbness for 1 day EXAM: CT HEAD WITHOUT CONTRAST TECHNIQUE: Contiguous axial images were obtained from the base of the skull through the vertex without intravenous contrast. COMPARISON:  Brain MRI January 16, 2012 FINDINGS: The ventricles are normal in size and configuration. Borderline parietal lobe atrophy is stable. There is no intracranial mass, hemorrhage, extra-axial fluid collection, or midline shift. Gray-white  compartments appear normal. No acute infarct evident. Bony calvarium appears intact. Mastoid air cells are clear. No intraorbital lesions are demonstrable. A punctate calcification in the right basal ganglia is likely physiologic. IMPRESSION: No intracranial mass, hemorrhage, or focal gray -white compartment lesions/acute appearing infarct. Electronically Signed   By: Bretta Bang III M.D.   On: 08/20/2015 13:37   Mr Angiogram Head Wo Contrast  08/20/2015  CLINICAL DATA:  Left eye fuzzy vision. Left cheek numbness. Off balance. History of fibromuscular dysplasia. EXAM: MR HEAD WITHOUT CONTRAST MR CIRCLE OF WILLIS WITHOUT CONTRAST MRA OF THE NECK WITHOUT AND WITH CONTRAST TECHNIQUE: Multiplanar and multiecho pulse sequences of the neck were obtained without and with intravenous contrast. Angiographic images of the neck were obtained using MRA technique without and with intravenous contast.; Multiplanar, multiecho pulse  sequences of the brain and surrounding structures were obtained according to standard protocol without intravenous contrast.; Angiographic images of the Circle of Willis were obtained using MRA technique without intravenous contrast. CONTRAST:  9mL MULTIHANCE GADOBENATE DIMEGLUMINE 529 MG/ML IV SOLN COMPARISON:  Head CT 08/20/2015. Head MRI and neck MRA 01/15/2012. Head MRA 02/07/2007. FINDINGS: MR HEAD FINDINGS There is no evidence of acute infarct, intracranial hemorrhage, mass, midline shift, or extra-axial fluid collection. Mild cerebral atrophy in the parietal regions does not appear significantly changed from the prior MRI. No significant cerebral white matter disease is seen. Orbits are unremarkable. Paranasal sinuses and mastoid air cells are clear. Major intracranial vascular flow voids are preserved. MR CIRCLE OF WILLIS FINDINGS The intracranial vertebral arteries are widely patent and codominant. PICA and SCA origins are patent. Basilar artery is patent without stenosis. There are  patent posterior communicating arteries bilaterally. PCAs are patent without evidence of significant stenosis. Intracranial internal carotid arteries are patent without evidence of significant stenosis. ACAs and MCAs are patent without evidence of major branch occlusion or significant stenosis. No intracranial aneurysm is identified. MRA NECK FINDINGS Three vessel aortic arch. Brachiocephalic and subclavian arteries are patent, although the proximal left subclavian artery was incompletely imaged on the contrast-enhanced MRA. The common carotid arteries are widely patent. Changes of fibromuscular dysplasia are again seen diffusely involving the cervical internal carotid arteries and vertebral arteries, with multiple regions of alternating narrowing and dilatation. Relatively long segment irregular narrowing of the right internal carotid artery beginning approximately 2.2 cm beyond its origin has slightly progressed from the prior study with up to approximately 60% stenosis. This variably involves a length of approximately 3.5 cm. Irregular narrowing of the mid left cervical ICA does not appear significantly changed. The vertebral arteries are patent and codominant with antegrade flow bilaterally. The greatest degree of stenosis is again seen involving the left vertebral artery at the C1 level. Narrowing of both mid V2 segments has slightly increased. IMPRESSION: 1. No acute intracranial abnormality. 2. Negative head MRA. 3. Diffuse changes of fibromuscular dysplasia involving the cervical internal carotid arteries and vertebral arteries, mildly progressed from prior. Up to 60% stenosis of the right ICA. Electronically Signed   By: Sebastian Ache M.D.   On: 08/20/2015 19:35   Mr Angiogram Neck W Wo Contrast  08/20/2015  CLINICAL DATA:  Left eye fuzzy vision. Left cheek numbness. Off balance. History of fibromuscular dysplasia. EXAM: MR HEAD WITHOUT CONTRAST MR CIRCLE OF WILLIS WITHOUT CONTRAST MRA OF THE NECK WITHOUT  AND WITH CONTRAST TECHNIQUE: Multiplanar and multiecho pulse sequences of the neck were obtained without and with intravenous contrast. Angiographic images of the neck were obtained using MRA technique without and with intravenous contast.; Multiplanar, multiecho pulse sequences of the brain and surrounding structures were obtained according to standard protocol without intravenous contrast.; Angiographic images of the Circle of Willis were obtained using MRA technique without intravenous contrast. CONTRAST:  9mL MULTIHANCE GADOBENATE DIMEGLUMINE 529 MG/ML IV SOLN COMPARISON:  Head CT 08/20/2015. Head MRI and neck MRA 01/15/2012. Head MRA 02/07/2007. FINDINGS: MR HEAD FINDINGS There is no evidence of acute infarct, intracranial hemorrhage, mass, midline shift, or extra-axial fluid collection. Mild cerebral atrophy in the parietal regions does not appear significantly changed from the prior MRI. No significant cerebral white matter disease is seen. Orbits are unremarkable. Paranasal sinuses and mastoid air cells are clear. Major intracranial vascular flow voids are preserved. MR CIRCLE OF WILLIS FINDINGS The intracranial vertebral arteries are widely patent and codominant.  PICA and SCA origins are patent. Basilar artery is patent without stenosis. There are patent posterior communicating arteries bilaterally. PCAs are patent without evidence of significant stenosis. Intracranial internal carotid arteries are patent without evidence of significant stenosis. ACAs and MCAs are patent without evidence of major branch occlusion or significant stenosis. No intracranial aneurysm is identified. MRA NECK FINDINGS Three vessel aortic arch. Brachiocephalic and subclavian arteries are patent, although the proximal left subclavian artery was incompletely imaged on the contrast-enhanced MRA. The common carotid arteries are widely patent. Changes of fibromuscular dysplasia are again seen diffusely involving the cervical internal  carotid arteries and vertebral arteries, with multiple regions of alternating narrowing and dilatation. Relatively long segment irregular narrowing of the right internal carotid artery beginning approximately 2.2 cm beyond its origin has slightly progressed from the prior study with up to approximately 60% stenosis. This variably involves a length of approximately 3.5 cm. Irregular narrowing of the mid left cervical ICA does not appear significantly changed. The vertebral arteries are patent and codominant with antegrade flow bilaterally. The greatest degree of stenosis is again seen involving the left vertebral artery at the C1 level. Narrowing of both mid V2 segments has slightly increased. IMPRESSION: 1. No acute intracranial abnormality. 2. Negative head MRA. 3. Diffuse changes of fibromuscular dysplasia involving the cervical internal carotid arteries and vertebral arteries, mildly progressed from prior. Up to 60% stenosis of the right ICA. Electronically Signed   By: Sebastian Ache M.D.   On: 08/20/2015 19:35   Mr Brain Wo Contrast  08/20/2015  CLINICAL DATA:  Left eye fuzzy vision. Left cheek numbness. Off balance. History of fibromuscular dysplasia. EXAM: MR HEAD WITHOUT CONTRAST MR CIRCLE OF WILLIS WITHOUT CONTRAST MRA OF THE NECK WITHOUT AND WITH CONTRAST TECHNIQUE: Multiplanar and multiecho pulse sequences of the neck were obtained without and with intravenous contrast. Angiographic images of the neck were obtained using MRA technique without and with intravenous contast.; Multiplanar, multiecho pulse sequences of the brain and surrounding structures were obtained according to standard protocol without intravenous contrast.; Angiographic images of the Circle of Willis were obtained using MRA technique without intravenous contrast. CONTRAST:  9mL MULTIHANCE GADOBENATE DIMEGLUMINE 529 MG/ML IV SOLN COMPARISON:  Head CT 08/20/2015. Head MRI and neck MRA 01/15/2012. Head MRA 02/07/2007. FINDINGS: MR HEAD  FINDINGS There is no evidence of acute infarct, intracranial hemorrhage, mass, midline shift, or extra-axial fluid collection. Mild cerebral atrophy in the parietal regions does not appear significantly changed from the prior MRI. No significant cerebral white matter disease is seen. Orbits are unremarkable. Paranasal sinuses and mastoid air cells are clear. Major intracranial vascular flow voids are preserved. MR CIRCLE OF WILLIS FINDINGS The intracranial vertebral arteries are widely patent and codominant. PICA and SCA origins are patent. Basilar artery is patent without stenosis. There are patent posterior communicating arteries bilaterally. PCAs are patent without evidence of significant stenosis. Intracranial internal carotid arteries are patent without evidence of significant stenosis. ACAs and MCAs are patent without evidence of major branch occlusion or significant stenosis. No intracranial aneurysm is identified. MRA NECK FINDINGS Three vessel aortic arch. Brachiocephalic and subclavian arteries are patent, although the proximal left subclavian artery was incompletely imaged on the contrast-enhanced MRA. The common carotid arteries are widely patent. Changes of fibromuscular dysplasia are again seen diffusely involving the cervical internal carotid arteries and vertebral arteries, with multiple regions of alternating narrowing and dilatation. Relatively long segment irregular narrowing of the right internal carotid artery beginning approximately 2.2 cm beyond its origin  has slightly progressed from the prior study with up to approximately 60% stenosis. This variably involves a length of approximately 3.5 cm. Irregular narrowing of the mid left cervical ICA does not appear significantly changed. The vertebral arteries are patent and codominant with antegrade flow bilaterally. The greatest degree of stenosis is again seen involving the left vertebral artery at the C1 level. Narrowing of both mid V2 segments  has slightly increased. IMPRESSION: 1. No acute intracranial abnormality. 2. Negative head MRA. 3. Diffuse changes of fibromuscular dysplasia involving the cervical internal carotid arteries and vertebral arteries, mildly progressed from prior. Up to 60% stenosis of the right ICA. Electronically Signed   By: Sebastian Ache M.D.   On: 08/20/2015 19:35   Mr Thoracic Spine Wo Contrast  08/21/2015  CLINICAL DATA:  Thoracic back pain that comes and goes over months. Pain is below the right scapula. EXAM: MRI THORACIC SPINE WITHOUT CONTRAST TECHNIQUE: Multiplanar, multisequence MR imaging of the thoracic spine was performed. No intravenous contrast was administered. COMPARISON:  None. FINDINGS: Normal thoracic cord signal and morphology. Subtle linear hyperintensity in the anterior cord at the level of C7 on sagittal imaging may be Gibbs artifact or visible central canal. No true syrinx is suspected. There is T6 inferior endplate irregularity with central T2 hyperintensity and mild regional marrow edema. Negative for bone lesion or typical discitis. Diffuse disc narrowing and spondylotic endplate spurring. Tiny left paracentral disc protrusion at T6-7, without neural contact. No impingement of the canal or foraminal level. A lobulated hyperintense mass in the posterior right liver measuring 12 mm is likely cyst or hemangioma based on bright T2 signal. There is no indication of malignancy history. Fluid level in the partly visualized gallbladder, without discrete/rounded stone. IMPRESSION: 1. No stenosis or impingement to explain right-sided pain. 2. Mild disc degeneration. 3. T6 inferior endplate Schmorl's node which may be recent given associated edema. 4. 12 mm right liver lesion with signal favoring cyst or hemangioma. Electronically Signed   By: Marnee Spring M.D.   On: 08/21/2015 10:31    Labs:  CBC:  Recent Labs  12/18/14 1519 08/20/15 1247 08/20/15 1304 08/21/15 0711  WBC 7.6 5.2  --  5.6  HGB 13.7  14.0 16.0* 14.3  HCT 41.7 43.8 47.0* 43.8  PLT 224.0 266  --  240    COAGS:  Recent Labs  08/20/15 1247  INR 1.09  APTT 34    BMP:  Recent Labs  12/18/14 1519 08/20/15 1247 08/20/15 1304 08/21/15 0711  NA 140 144 144 141  K 4.8 3.7 3.7 4.0  CL 103 104 105 104  CO2 30 28  --  29  GLUCOSE 86 87 83 87  BUN 19 11 10 12   CALCIUM 10.3 9.8  --  9.6  CREATININE 0.78 0.82 0.80 1.00  GFRNONAA  --  >60  --  >60  GFRAA  --  >60  --  >60    LIVER FUNCTION TESTS:  Recent Labs  12/18/14 1519 08/20/15 1247  BILITOT 0.5 0.9  AST 25 32  ALT 18 22  ALKPHOS 76 82  PROT 7.5 8.1  ALBUMIN 4.7 5.3*    TUMOR MARKERS: No results for input(s): AFPTM, CEA, CA199, CHROMGRNA in the last 8760 hours.  Assessment and Plan:  Fibromuscular dysplasia Carotid artery stenosis seen on MRA ? Renal artery stenosis  Will proceed with Renal and Carotid/Vertebral arteriogram tomorrow by Dr. Corliss Skains.  Will make NPO after MN and hold Lovenox. OK to continue ASA.  Risks and Benefits discussed with the patient including, but not limited to bleeding, infection, vascular injury or contrast induced renal failure.  All of the patient's questions were answered, patient is agreeable to proceed. Consent signed and in chart.   Thank you for this interesting consult.  I greatly enjoyed meeting NAJIYAH PARIS and look forward to participating in their care.  A copy of this report was sent to the requesting provider on this date.  Electronically Signed: Gwynneth Macleod PA-C 08/21/2015, 3:18 PM   I spent a total of 40 Minutes in face to face in clinical consultation, greater than 50% of which was counseling/coordinating care for Carotid stenosis secondary to FMD, for carotid arteriogram.

## 2015-08-21 NOTE — Care Management Note (Signed)
Case Management Note  Patient Details  Name: Cheryl [name] MRN: [id number] Date of Birth: 10/01/1960  Subjective/Objective:                    Action/Plan:Awaiting PT/OT recommendation. To follow up out patient with PCP. CM will continue to follow for discharge needs.    Expected Discharge Date:   (unknown)               Expected Discharge Plan:     In-House Referral:     Discharge planning Services     Post Acute Care Choice:    Choice offered to:     DME Arranged:    DME Agency:     HH Arranged:    HH Agency:     Status of Service:     Medicare Important Message Given:    Date Medicare IM Given:    Medicare IM give by:    Date Additional Medicare IM Given:    Additional Medicare Important Message give by:     If discussed at Long Length of Stay Meetings, dates discussed:    Additional Comments:  Cheryl Neu, RN 08/21/2015, 1:32 PM

## 2015-08-21 NOTE — Progress Notes (Signed)
[facility]                                                                              Patient Demographics  Cheryl [name], is a 55 y.o. female, DOB - 1960-12-05, ZOX:[id number]  Admit date - 08/20/2015   Admitting Physician Albertine Grates, MD  Outpatient Primary MD for the patient is Rogelia Boga, MD  LOS - 1   Chief Complaint  Patient presents with  . Blurred Vision  . 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 She also has persistent blurred vision as well as throbbing behind her left eye, she presented to [facility] ED, Ct head no acute findings. Patient was admitted to [facility] for further workup for TIA/CVA.   Assessment & Plan     TIA:  Left sided numbness/tonue deviation now has largely resolved, persistent blurry vision and throbbing pain behind left eye, though nontender on palpation to left temporal forehead - CT head with no acute findings. Neurology was consulted. - MRI brain showed no acute intracranial abnormalities - MRA showed normal intracranial circulation except diffuse changes of fibromuscular dysplasia, per patient known chronic issue. - 2-D echo pending - Carotid Dopplers pending - Neurology recommended aspirin 325 mg daily - Lipid panel showed LDL 205, goal less than 70, placed on statin - Hemoglobin A1c pending - PT evaluation pending  HTN; patient has not been on meds for a while.  - For now allow permissive HTN, will likely need to be discharged on bp meds.  H/o carotid fibromuscular dysplasia, MRA neck pending. Had been followed by neurology and vascular surgery.  thoracic back pain for several months - MRI showed no stenosis or impingement, mild disc degeneration, 12  mm right liver lesion favoring cyst or hemangioma -Outpatient follow-up with PCP and further workup if needed   Code Status: Full code  Family Communication: Discussed in detail with the patient, all imaging results, lab results explained to the patient   Disposition Plan:  stroke workup  Time Spent in minutes  25 minutes  Procedures  MRI, MRA brain, MRA neck, MRI thoracic spine 2-D echo  Consults   Neurology  DVT Prophylaxis  Lovenox   Medications  Scheduled Meds: . aspirin  325 mg Oral Daily  . calcium carbonate  1 tablet Oral BID  . enoxaparin (LOVENOX) injection  40 mg Subcutaneous Q24H  . loratadine  10 mg Oral Daily  . multivitamin with minerals  1 tablet Oral Daily  . sodium chloride  3 mL Intravenous Q12H   Continuous Infusions:  PRN Meds:.ibuprofen, sodium chloride   Antibiotics   Anti-infectives    None        Subjective:   Mikel Pyon was seen and examined today.  Patient denies dizziness, chest pain, shortness of breath, abdominal pain, N/V/D/C, new weakness, numbess, tingling. No acute events overnight.  Complains of thoracic back pain for several months   Objective:   Blood pressure 113/79, pulse 62, temperature 98.1 F (36.7 C), temperature source Oral, resp. rate 18, height 5' (1.524 m), weight 44.679 kg (98 lb 8 oz), last menstrual period 12/22/2011, SpO2 98 %.  Wt Readings from Last 3 Encounters:  08/20/15 44.679 kg (98 lb 8 oz)  12/18/14 44.906 kg (99 lb)  04/16/14 47.174 kg (104 lb)    No intake or output data in the 24 hours ending 08/21/15 1200  Exam  General: Alert and oriented x 3, NAD  HEENT:  PERRLA, EOMI, Anicteric Sclera, mucous membranes moist.   Neck: Supple, no JVD, no masses  CVS: S1 S2 auscultated, no rubs, murmurs or gallops. Regular rate and rhythm.  Respiratory: Clear to auscultation bilaterally, no wheezing, rales or rhonchi  Abdomen: Soft, nontender, nondistended, + bowel sounds  Ext: no cyanosis  clubbing or edema  Neuro: AAOx3, Cr N's II- XII. Strength 5/5 upper and lower extremities bilaterally  Skin: No rashes  Psych: Normal affect and demeanor, alert and oriented x3    Data Review   Micro Results No results found for this or any previous visit (from the past 240 hour(s)).  Radiology Reports Ct Head Wo Contrast  08/20/2015  CLINICAL DATA:  Intermittent dizziness for 3 days. Blurred vision left eye. Facial numbness for 1 day EXAM: CT HEAD WITHOUT CONTRAST TECHNIQUE: Contiguous axial images were obtained from the base of the skull through the vertex without intravenous contrast. COMPARISON:  Brain MRI January 16, 2012 FINDINGS: The ventricles are normal in size and configuration. Borderline parietal lobe atrophy is stable. There is no intracranial mass, hemorrhage, extra-axial fluid collection, or midline shift. Gray-white compartments appear normal. No acute infarct evident. Bony calvarium appears intact. Mastoid air cells are clear. No intraorbital lesions are demonstrable. A punctate calcification in the right basal ganglia is likely physiologic. IMPRESSION: No intracranial mass, hemorrhage, or focal gray -white compartment lesions/acute appearing infarct. Electronically Signed   By: Bretta Bang III M.D.   On: 08/20/2015 13:37   Mr Angiogram Head Wo Contrast  08/20/2015  CLINICAL DATA:  Left eye fuzzy vision. Left cheek numbness. Off balance. History of fibromuscular dysplasia. EXAM: MR HEAD WITHOUT CONTRAST MR CIRCLE OF WILLIS WITHOUT CONTRAST MRA OF THE NECK WITHOUT AND WITH CONTRAST TECHNIQUE: Multiplanar and multiecho pulse sequences of the neck were obtained without and with intravenous contrast. Angiographic images of the neck were obtained using MRA technique without and with intravenous contast.; Multiplanar, multiecho pulse sequences of the brain and surrounding structures were obtained according to standard protocol without intravenous contrast.; Angiographic images of the  Circle of Willis were obtained using MRA technique without intravenous contrast. CONTRAST:  9mL MULTIHANCE GADOBENATE DIMEGLUMINE 529 MG/ML IV SOLN COMPARISON:  Head CT 08/20/2015. Head MRI and neck MRA 01/15/2012. Head MRA 02/07/2007. FINDINGS: MR HEAD FINDINGS There is no evidence of acute infarct, intracranial hemorrhage, mass, midline shift, or extra-axial fluid collection. Mild cerebral atrophy in the parietal regions does not appear significantly changed from the prior MRI. No significant cerebral white matter disease is seen. Orbits are unremarkable. Paranasal sinuses and mastoid air cells are clear. Major intracranial vascular flow voids are preserved. MR CIRCLE OF WILLIS FINDINGS The intracranial vertebral arteries are widely patent and codominant. PICA and SCA origins are patent. Basilar artery is patent without stenosis. There are patent posterior communicating arteries bilaterally. PCAs are patent without evidence of significant stenosis. Intracranial internal carotid arteries are patent without evidence  of significant stenosis. ACAs and MCAs are patent without evidence of major branch occlusion or significant stenosis. No intracranial aneurysm is identified. MRA NECK FINDINGS Three vessel aortic arch. Brachiocephalic and subclavian arteries are patent, although the proximal left subclavian artery was incompletely imaged on the contrast-enhanced MRA. The common carotid arteries are widely patent. Changes of fibromuscular dysplasia are again seen diffusely involving the cervical internal carotid arteries and vertebral arteries, with multiple regions of alternating narrowing and dilatation. Relatively long segment irregular narrowing of the right internal carotid artery beginning approximately 2.2 cm beyond its origin has slightly progressed from the prior study with up to approximately 60% stenosis. This variably involves a length of approximately 3.5 cm. Irregular narrowing of the mid left cervical ICA  does not appear significantly changed. The vertebral arteries are patent and codominant with antegrade flow bilaterally. The greatest degree of stenosis is again seen involving the left vertebral artery at the C1 level. Narrowing of both mid V2 segments has slightly increased. IMPRESSION: 1. No acute intracranial abnormality. 2. Negative head MRA. 3. Diffuse changes of fibromuscular dysplasia involving the cervical internal carotid arteries and vertebral arteries, mildly progressed from prior. Up to 60% stenosis of the right ICA. Electronically Signed   By: Sebastian Ache M.D.   On: 08/20/2015 19:35   Mr Angiogram Neck W Wo Contrast  08/20/2015  CLINICAL DATA:  Left eye fuzzy vision. Left cheek numbness. Off balance. History of fibromuscular dysplasia. EXAM: MR HEAD WITHOUT CONTRAST MR CIRCLE OF WILLIS WITHOUT CONTRAST MRA OF THE NECK WITHOUT AND WITH CONTRAST TECHNIQUE: Multiplanar and multiecho pulse sequences of the neck were obtained without and with intravenous contrast. Angiographic images of the neck were obtained using MRA technique without and with intravenous contast.; Multiplanar, multiecho pulse sequences of the brain and surrounding structures were obtained according to standard protocol without intravenous contrast.; Angiographic images of the Circle of Willis were obtained using MRA technique without intravenous contrast. CONTRAST:  9mL MULTIHANCE GADOBENATE DIMEGLUMINE 529 MG/ML IV SOLN COMPARISON:  Head CT 08/20/2015. Head MRI and neck MRA 01/15/2012. Head MRA 02/07/2007. FINDINGS: MR HEAD FINDINGS There is no evidence of acute infarct, intracranial hemorrhage, mass, midline shift, or extra-axial fluid collection. Mild cerebral atrophy in the parietal regions does not appear significantly changed from the prior MRI. No significant cerebral white matter disease is seen. Orbits are unremarkable. Paranasal sinuses and mastoid air cells are clear. Major intracranial vascular flow voids are preserved.  MR CIRCLE OF WILLIS FINDINGS The intracranial vertebral arteries are widely patent and codominant. PICA and SCA origins are patent. Basilar artery is patent without stenosis. There are patent posterior communicating arteries bilaterally. PCAs are patent without evidence of significant stenosis. Intracranial internal carotid arteries are patent without evidence of significant stenosis. ACAs and MCAs are patent without evidence of major branch occlusion or significant stenosis. No intracranial aneurysm is identified. MRA NECK FINDINGS Three vessel aortic arch. Brachiocephalic and subclavian arteries are patent, although the proximal left subclavian artery was incompletely imaged on the contrast-enhanced MRA. The common carotid arteries are widely patent. Changes of fibromuscular dysplasia are again seen diffusely involving the cervical internal carotid arteries and vertebral arteries, with multiple regions of alternating narrowing and dilatation. Relatively long segment irregular narrowing of the right internal carotid artery beginning approximately 2.2 cm beyond its origin has slightly progressed from the prior study with up to approximately 60% stenosis. This variably involves a length of approximately 3.5 cm. Irregular narrowing of the mid left cervical ICA does not appear  significantly changed. The vertebral arteries are patent and codominant with antegrade flow bilaterally. The greatest degree of stenosis is again seen involving the left vertebral artery at the C1 level. Narrowing of both mid V2 segments has slightly increased. IMPRESSION: 1. No acute intracranial abnormality. 2. Negative head MRA. 3. Diffuse changes of fibromuscular dysplasia involving the cervical internal carotid arteries and vertebral arteries, mildly progressed from prior. Up to 60% stenosis of the right ICA. Electronically Signed   By: Sebastian Ache M.D.   On: 08/20/2015 19:35   Mr Brain Wo Contrast  08/20/2015  CLINICAL DATA:  Left eye  fuzzy vision. Left cheek numbness. Off balance. History of fibromuscular dysplasia. EXAM: MR HEAD WITHOUT CONTRAST MR CIRCLE OF WILLIS WITHOUT CONTRAST MRA OF THE NECK WITHOUT AND WITH CONTRAST TECHNIQUE: Multiplanar and multiecho pulse sequences of the neck were obtained without and with intravenous contrast. Angiographic images of the neck were obtained using MRA technique without and with intravenous contast.; Multiplanar, multiecho pulse sequences of the brain and surrounding structures were obtained according to standard protocol without intravenous contrast.; Angiographic images of the Circle of Willis were obtained using MRA technique without intravenous contrast. CONTRAST:  9mL MULTIHANCE GADOBENATE DIMEGLUMINE 529 MG/ML IV SOLN COMPARISON:  Head CT 08/20/2015. Head MRI and neck MRA 01/15/2012. Head MRA 02/07/2007. FINDINGS: MR HEAD FINDINGS There is no evidence of acute infarct, intracranial hemorrhage, mass, midline shift, or extra-axial fluid collection. Mild cerebral atrophy in the parietal regions does not appear significantly changed from the prior MRI. No significant cerebral white matter disease is seen. Orbits are unremarkable. Paranasal sinuses and mastoid air cells are clear. Major intracranial vascular flow voids are preserved. MR CIRCLE OF WILLIS FINDINGS The intracranial vertebral arteries are widely patent and codominant. PICA and SCA origins are patent. Basilar artery is patent without stenosis. There are patent posterior communicating arteries bilaterally. PCAs are patent without evidence of significant stenosis. Intracranial internal carotid arteries are patent without evidence of significant stenosis. ACAs and MCAs are patent without evidence of major branch occlusion or significant stenosis. No intracranial aneurysm is identified. MRA NECK FINDINGS Three vessel aortic arch. Brachiocephalic and subclavian arteries are patent, although the proximal left subclavian artery was incompletely  imaged on the contrast-enhanced MRA. The common carotid arteries are widely patent. Changes of fibromuscular dysplasia are again seen diffusely involving the cervical internal carotid arteries and vertebral arteries, with multiple regions of alternating narrowing and dilatation. Relatively long segment irregular narrowing of the right internal carotid artery beginning approximately 2.2 cm beyond its origin has slightly progressed from the prior study with up to approximately 60% stenosis. This variably involves a length of approximately 3.5 cm. Irregular narrowing of the mid left cervical ICA does not appear significantly changed. The vertebral arteries are patent and codominant with antegrade flow bilaterally. The greatest degree of stenosis is again seen involving the left vertebral artery at the C1 level. Narrowing of both mid V2 segments has slightly increased. IMPRESSION: 1. No acute intracranial abnormality. 2. Negative head MRA. 3. Diffuse changes of fibromuscular dysplasia involving the cervical internal carotid arteries and vertebral arteries, mildly progressed from prior. Up to 60% stenosis of the right ICA. Electronically Signed   By: Sebastian Ache M.D.   On: 08/20/2015 19:35   Mr Thoracic Spine Wo Contrast  08/21/2015  CLINICAL DATA:  Thoracic back pain that comes and goes over months. Pain is below the right scapula. EXAM: MRI THORACIC SPINE WITHOUT CONTRAST TECHNIQUE: Multiplanar, multisequence MR imaging of the thoracic spine  was performed. No intravenous contrast was administered. COMPARISON:  None. FINDINGS: Normal thoracic cord signal and morphology. Subtle linear hyperintensity in the anterior cord at the level of C7 on sagittal imaging may be Gibbs artifact or visible central canal. No true syrinx is suspected. There is T6 inferior endplate irregularity with central T2 hyperintensity and mild regional marrow edema. Negative for bone lesion or typical discitis. Diffuse disc narrowing and  spondylotic endplate spurring. Tiny left paracentral disc protrusion at T6-7, without neural contact. No impingement of the canal or foraminal level. A lobulated hyperintense mass in the posterior right liver measuring 12 mm is likely cyst or hemangioma based on bright T2 signal. There is no indication of malignancy history. Fluid level in the partly visualized gallbladder, without discrete/rounded stone. IMPRESSION: 1. No stenosis or impingement to explain right-sided pain. 2. Mild disc degeneration. 3. T6 inferior endplate Schmorl's node which may be recent given associated edema. 4. 12 mm right liver lesion with signal favoring cyst or hemangioma. Electronically Signed   By: Marnee Spring M.D.   On: 08/21/2015 10:31    CBC  Recent Labs Lab 08/20/15 1247 08/20/15 1304 08/21/15 0711  WBC 5.2  --  5.6  HGB 14.0 16.0* 14.3  HCT 43.8 47.0* 43.8  PLT 266  --  240  MCV 88.0  --  86.9  MCH 28.1  --  28.4  MCHC 32.0  --  32.6  RDW 13.6  --  13.6  LYMPHSABS 1.6  --   --   MONOABS 0.3  --   --   EOSABS 0.1  --   --   BASOSABS 0.0  --   --     Chemistries   Recent Labs Lab 08/20/15 1247 08/20/15 1304 08/21/15 0711  NA 144 144 141  K 3.7 3.7 4.0  CL 104 105 104  CO2 28  --  29  GLUCOSE 87 83 87  BUN CREATININE 0.82 0.80 1.00  CALCIUM 9.8  --  9.6  AST 32  --   --   ALT 22  --   --   ALKPHOS 82  --   --   BILITOT 0.9  --   --    ------------------------------------------------------------------------------------------------------------------ estimated creatinine clearance is 44.9 mL/min (by C-G formula based on Cr of 1). ------------------------------------------------------------------------------------------------------------------ No results for input(s): HGBA1C in the last 72 hours. ------------------------------------------------------------------------------------------------------------------  Recent Labs  08/21/15 0717  CHOL 284*  HDL 68  LDLCALC 205*    TRIG 56  CHOLHDL 4.2   ------------------------------------------------------------------------------------------------------------------ No results for input(s): TSH, T4TOTAL, T3FREE, THYROIDAB in the last 72 hours.  Invalid input(s): FREET3 ------------------------------------------------------------------------------------------------------------------ No results for input(s): VITAMINB12, FOLATE, FERRITIN, TIBC, IRON, RETICCTPCT in the last 72 hours.  Coagulation profile  Recent Labs Lab 08/20/15 1247  INR 1.09    No results for input(s): DDIMER in the last 72 hours.  Cardiac Enzymes No results for input(s): CKMB, TROPONINI, MYOGLOBIN in the last 168 hours.  Invalid input(s): CK ------------------------------------------------------------------------------------------------------------------ Invalid input(s): POCBNP   Recent Labs  08/20/15 1238  GLUCAP 80     Rohail Klees M.D. [facility] 08/21/2015, 12:00 PM  Pager: [phone] Between 7am to 7pm - call Pager - [phone]  After 7pm go to [email or web address] - password [email or web address]  Call night coverage person covering after 7pm

## 2015-08-21 NOTE — Progress Notes (Signed)
  Echocardiogram 2D Echocardiogram has been performed.  Yoshiaki Kreuser 08/21/2015, 9:15 AM

## 2015-08-21 NOTE — Progress Notes (Signed)
Initial Nutrition Assessment  INTERVENTION:  Provide Boost Plus BID, each supplement provides 360 kcal and 13 grams of protein  NUTRITION DIAGNOSIS:   Inadequate oral intake related to poor appetite, other (see comment) (early satiety, stress) as evidenced by per patient/family report, moderate depletions of muscle mass, mild depletion of body fat.   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 She also has persistent blurred vision as well as throbbing behind her left eye, she presented to [facility] ED, Ct head no acute findings  Pt states that she used to weigh 110 lbs, but has gradually lost weight (down to 94 lbs) due to stress and limited time to eat. She reports being very busy at work, walking around all day with little time to eat. She reports having a lack of energy and feeling tired often since she has lost weight. She makes an effort to eat healthy as she has a family history of high cholesterol. RD emphasized the importance of adequate nutrient intake and discussed tips for improving food intake. Pt reports early satiety due to habit of snacking at work. She is agreeable to receiving a nutritional supplement.   Labs: elevated cholesterol  Diet Order:  Diet Heart Room service appropriate?: Yes; Fluid consistency:: Thin Diet NPO time specified Except for: Sips with Meds  Skin:  Reviewed, no issues  Last BM:  1/16  Height:   Ht Readings from Last 1 Encounters:  08/21/15 5' (1.524 m)    Weight:   Wt Readings from Last 1 Encounters:  08/20/15 98 lb 8 oz (44.679  kg)    Ideal Body Weight:  45.5 kg  BMI:  Body mass index is 19.24 kg/(m^2).  Estimated Nutritional Needs:   Kcal:  1500-1700  Protein:  55-65 grams  Fluid:  1.5 L/day  EDUCATION NEEDS:   No education needs identified at this time  Dorothea Ogle RD, LDN Inpatient Clinical Dietitian Pager: [phone] After Hours Pager: [phone]

## 2015-08-21 NOTE — Progress Notes (Signed)
Utilization Review Completed.Leanard Dimaio T1/18/2017  

## 2015-08-22 ENCOUNTER — Inpatient Hospital Stay (HOSPITAL_COMMUNITY): Payer: 59

## 2015-08-22 DIAGNOSIS — G459 Transient cerebral ischemic attack, unspecified: Principal | ICD-10-CM

## 2015-08-22 DIAGNOSIS — I773 Arterial fibromuscular dysplasia: Secondary | ICD-10-CM

## 2015-08-22 LAB — HEMOGLOBIN A1C
HEMOGLOBIN A1C: 5.7 % — AB (ref 4.8–5.6)
MEAN PLASMA GLUCOSE: 117 mg/dL

## 2015-08-22 MED ORDER — ASPIRIN 325 MG PO TABS: 325.0000 mg | ORAL_TABLET | Freq: Every day | ORAL | Status: DC

## 2015-08-22 MED ORDER — SODIUM CHLORIDE 0.9 % IV SOLN: INTRAVENOUS | Status: AC

## 2015-08-22 MED ORDER — FENTANYL CITRATE (PF) 100 MCG/2ML IJ SOLN
INTRAMUSCULAR | Status: AC | PRN
  Administered 2015-08-22: 25 ug via INTRAVENOUS

## 2015-08-22 MED ORDER — FENTANYL CITRATE (PF) 100 MCG/2ML IJ SOLN
INTRAMUSCULAR | Status: AC
  Filled 2015-08-22: qty 2

## 2015-08-22 MED ORDER — HYDRALAZINE HCL 20 MG/ML IJ SOLN
INTRAMUSCULAR | Status: AC | PRN
  Administered 2015-08-22: 5 mg via INTRAVENOUS

## 2015-08-22 MED ORDER — LIDOCAINE HCL 1 % IJ SOLN
INTRAMUSCULAR | Status: AC
  Filled 2015-08-22: qty 20

## 2015-08-22 MED ORDER — IOHEXOL 300 MG/ML  SOLN
150.0000 mL | Freq: Once | INTRAMUSCULAR | Status: AC | PRN
  Administered 2015-08-22: 150 mL via INTRAVENOUS

## 2015-08-22 MED ORDER — MIDAZOLAM HCL 2 MG/2ML IJ SOLN
INTRAMUSCULAR | Status: AC
  Filled 2015-08-22: qty 2

## 2015-08-22 MED ORDER — HEPARIN SODIUM (PORCINE) 1000 UNIT/ML IJ SOLN
INTRAMUSCULAR | Status: AC
  Filled 2015-08-22: qty 1

## 2015-08-22 MED ORDER — AMLODIPINE BESYLATE 5 MG PO TABS: 5.0000 mg | ORAL_TABLET | Freq: Every day | ORAL | Status: DC

## 2015-08-22 MED ORDER — SODIUM CHLORIDE 0.9 % IV SOLN
INTRAVENOUS | Status: AC | PRN
  Administered 2015-08-22: 10 mL/h via INTRAVENOUS

## 2015-08-22 MED ORDER — MORPHINE SULFATE (PF) 2 MG/ML IV SOLN: 1.0000 mg | INTRAVENOUS | Status: DC | PRN

## 2015-08-22 MED ORDER — MIDAZOLAM HCL 2 MG/2ML IJ SOLN
INTRAMUSCULAR | Status: AC | PRN
  Administered 2015-08-22: 1 mg via INTRAVENOUS

## 2015-08-22 MED ORDER — ATORVASTATIN CALCIUM 40 MG PO TABS: 40.0000 mg | ORAL_TABLET | Freq: Every day | ORAL | Status: DC

## 2015-08-22 MED ORDER — HEPARIN SODIUM (PORCINE) 1000 UNIT/ML IJ SOLN
INTRAMUSCULAR | Status: AC | PRN
  Administered 2015-08-22: 1000 [IU] via INTRAVENOUS

## 2015-08-22 MED ORDER — HYDRALAZINE HCL 20 MG/ML IJ SOLN
INTRAMUSCULAR | Status: AC
  Filled 2015-08-22: qty 1

## 2015-08-22 NOTE — Progress Notes (Signed)
[facility]                                                                              Patient Demographics  Cheryl [name], is a 55 y.o. female, DOB - July 20, 1961, XBM:[id number]  Admit date - 08/20/2015   Admitting Physician Albertine Grates, MD  Outpatient Primary MD for the patient is Rogelia Boga, MD  LOS - 2   Chief Complaint  Patient presents with  . Blurred Vision  . 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 She also has persistent blurred vision as well as throbbing behind her left eye, she presented to [facility] ED, Ct head no acute findings. Patient was admitted to [facility] for further workup for TIA/CVA.   Assessment & Plan     TIA:  Left sided numbness/tonue deviation now has largely resolved, persistent blurry vision and throbbing pain behind left eye, though nontender on palpation to left temporal forehead - CT head with no acute findings. Neurology was consulted. - MRI brain showed no acute intracranial abnormalities - MRA showed normal intracranial circulation except diffuse changes of fibromuscular dysplasia, per patient known chronic issue. - 2-D echo showed EF of 55-60%, grade 1 diastolic dysfunction  - Carotid Dopplers canceled as patient to have cerebral angiogram. MRA neck showed either muscular dysplasia - Neurology recommended aspirin 325 mg daily - Lipid panel showed LDL 205, goal less than 70, placed on statin - Hemoglobin A1c 5.7  - PT evaluation pending   HTN; patient has not been on meds for a while.  - For now allow permissive HTN, will likely need to be discharged on bp meds.  H/o carotid fibromuscular dysplasia, MRA neck pending. Had been followed by neurology and  vascular surgery. - Cerebral angiogram and renal angiogram done today, results still pending - Patient had a bleeding after the procedure from the right groin, currently resolved. - Monitor H&H  thoracic back pain for several months - MRI showed no stenosis or impingement, mild disc degeneration, 12 mm right liver lesion favoring cyst or hemangioma -Outpatient follow-up with PCP and further workup if needed   Code Status: Full code  Family Communication: Discussed in detail with the patient, all imaging results, lab results explained to the patient   Disposition Plan:  DC home in a.m.  Time Spent in minutes  25 minutes  Procedures  MRI, MRA brain, MRA neck, MRI thoracic spine 2-D echo  Consults   Neurology  DVT Prophylaxis  Lovenox dc'ed, SCD  Medications  Scheduled Meds: . aspirin  325 mg Oral Daily  . atorvastatin  40 mg Oral q1800  . calcium carbonate  1 tablet Oral BID  . enoxaparin (LOVENOX) injection  40 mg Subcutaneous Q24H  . fentaNYL      . heparin      . hydrALAZINE      .  lactose free nutrition  237 mL Oral BID BM  . lidocaine      . loratadine  10 mg Oral Daily  . midazolam      . multivitamin with minerals  1 tablet Oral Daily  . sodium chloride  3 mL Intravenous Q12H   Continuous Infusions:  PRN Meds:.ibuprofen, morphine injection, sodium chloride   Antibiotics   Anti-infectives    None        Subjective:   Cheryl [name] was seen and examined today.  Patient denies dizziness, chest pain, shortness of breath, abdominal pain, N/V/D/C, new weakness, numbess, tingling. No acute events overnight.   Objective:   Blood pressure 138/77, pulse 97, temperature 98.2 F (36.8 C), temperature source Oral, resp. rate 16, height 5' (1.524 m), weight 44.679 kg (98 lb 8 oz), last menstrual period 12/22/2011, SpO2 100 %.  Wt Readings from Last 3 Encounters:  08/20/15 44.679 kg (98 lb 8 oz)  12/18/14 44.906 kg (99 lb)  04/16/14 47.174 kg (104 lb)      Intake/Output Summary (Last 24 hours) at 08/22/15 1817 Last data filed at 08/22/15 0455  Gross per 24 hour  Intake    360 ml  Output    150 ml  Net    210 ml    Exam  General: Alert and oriented x 3, NAD  HEENT:  PERRLA, EOMI, Anicteric Sclera, mucous membranes moist.   Neck: Supple, no JVD, no masses  CVS: S1 S2 auscultated, no rubs, murmurs or gallops. Regular rate and rhythm.  Respiratory: Clear to auscultation bilaterally, no wheezing, rales or rhonchi  Abdomen: Soft, nontender, nondistended, + bowel sounds  Ext: no cyanosis clubbing or edema  Neuro: AAOx3, Cr N's II- XII. Strength 5/5 upper and lower extremities bilaterally  Skin: No rashes  Psych: Normal affect and demeanor, alert and oriented x3    Data Review   Micro Results No results found for this or any previous visit (from the past 240 hour(s)).  Radiology Reports Ct Head Wo Contrast  08/20/2015  CLINICAL DATA:  Intermittent dizziness for 3 days. Blurred vision left eye. Facial numbness for 1 day EXAM: CT HEAD WITHOUT CONTRAST TECHNIQUE: Contiguous axial images were obtained from the base of the skull through the vertex without intravenous contrast. COMPARISON:  Brain MRI January 16, 2012 FINDINGS: The ventricles are normal in size and configuration. Borderline parietal lobe atrophy is stable. There is no intracranial mass, hemorrhage, extra-axial fluid collection, or midline shift. Gray-white compartments appear normal. No acute infarct evident. Bony calvarium appears intact. Mastoid air cells are clear. No intraorbital lesions are demonstrable. A punctate calcification in the right basal ganglia is likely physiologic. IMPRESSION: No intracranial mass, hemorrhage, or focal gray -white compartment lesions/acute appearing infarct. Electronically Signed   By: Bretta Bang III M.D.   On: 08/20/2015 13:37   Mr Angiogram Head Wo Contrast  08/20/2015  CLINICAL DATA:  Left eye fuzzy vision. Left cheek  numbness. Off balance. History of fibromuscular dysplasia. EXAM: MR HEAD WITHOUT CONTRAST MR CIRCLE OF WILLIS WITHOUT CONTRAST MRA OF THE NECK WITHOUT AND WITH CONTRAST TECHNIQUE: Multiplanar and multiecho pulse sequences of the neck were obtained without and with intravenous contrast. Angiographic images of the neck were obtained using MRA technique without and with intravenous contast.; Multiplanar, multiecho pulse sequences of the brain and surrounding structures were obtained according to standard protocol without intravenous contrast.; Angiographic images of the Circle of Willis were obtained using MRA technique without intravenous contrast. CONTRAST:  9mL MULTIHANCE  GADOBENATE DIMEGLUMINE 529 MG/ML IV SOLN COMPARISON:  Head CT 08/20/2015. Head MRI and neck MRA 01/15/2012. Head MRA 02/07/2007. FINDINGS: MR HEAD FINDINGS There is no evidence of acute infarct, intracranial hemorrhage, mass, midline shift, or extra-axial fluid collection. Mild cerebral atrophy in the parietal regions does not appear significantly changed from the prior MRI. No significant cerebral white matter disease is seen. Orbits are unremarkable. Paranasal sinuses and mastoid air cells are clear. Major intracranial vascular flow voids are preserved. MR CIRCLE OF WILLIS FINDINGS The intracranial vertebral arteries are widely patent and codominant. PICA and SCA origins are patent. Basilar artery is patent without stenosis. There are patent posterior communicating arteries bilaterally. PCAs are patent without evidence of significant stenosis. Intracranial internal carotid arteries are patent without evidence of significant stenosis. ACAs and MCAs are patent without evidence of major branch occlusion or significant stenosis. No intracranial aneurysm is identified. MRA NECK FINDINGS Three vessel aortic arch. Brachiocephalic and subclavian arteries are patent, although the proximal left subclavian artery was incompletely imaged on the  contrast-enhanced MRA. The common carotid arteries are widely patent. Changes of fibromuscular dysplasia are again seen diffusely involving the cervical internal carotid arteries and vertebral arteries, with multiple regions of alternating narrowing and dilatation. Relatively long segment irregular narrowing of the right internal carotid artery beginning approximately 2.2 cm beyond its origin has slightly progressed from the prior study with up to approximately 60% stenosis. This variably involves a length of approximately 3.5 cm. Irregular narrowing of the mid left cervical ICA does not appear significantly changed. The vertebral arteries are patent and codominant with antegrade flow bilaterally. The greatest degree of stenosis is again seen involving the left vertebral artery at the C1 level. Narrowing of both mid V2 segments has slightly increased. IMPRESSION: 1. No acute intracranial abnormality. 2. Negative head MRA. 3. Diffuse changes of fibromuscular dysplasia involving the cervical internal carotid arteries and vertebral arteries, mildly progressed from prior. Up to 60% stenosis of the right ICA. Electronically Signed   By: Sebastian Ache M.D.   On: 08/20/2015 19:35   Mr Angiogram Neck W Wo Contrast  08/20/2015  CLINICAL DATA:  Left eye fuzzy vision. Left cheek numbness. Off balance. History of fibromuscular dysplasia. EXAM: MR HEAD WITHOUT CONTRAST MR CIRCLE OF WILLIS WITHOUT CONTRAST MRA OF THE NECK WITHOUT AND WITH CONTRAST TECHNIQUE: Multiplanar and multiecho pulse sequences of the neck were obtained without and with intravenous contrast. Angiographic images of the neck were obtained using MRA technique without and with intravenous contast.; Multiplanar, multiecho pulse sequences of the brain and surrounding structures were obtained according to standard protocol without intravenous contrast.; Angiographic images of the Circle of Willis were obtained using MRA technique without intravenous contrast.  CONTRAST:  9mL MULTIHANCE GADOBENATE DIMEGLUMINE 529 MG/ML IV SOLN COMPARISON:  Head CT 08/20/2015. Head MRI and neck MRA 01/15/2012. Head MRA 02/07/2007. FINDINGS: MR HEAD FINDINGS There is no evidence of acute infarct, intracranial hemorrhage, mass, midline shift, or extra-axial fluid collection. Mild cerebral atrophy in the parietal regions does not appear significantly changed from the prior MRI. No significant cerebral white matter disease is seen. Orbits are unremarkable. Paranasal sinuses and mastoid air cells are clear. Major intracranial vascular flow voids are preserved. MR CIRCLE OF WILLIS FINDINGS The intracranial vertebral arteries are widely patent and codominant. PICA and SCA origins are patent. Basilar artery is patent without stenosis. There are patent posterior communicating arteries bilaterally. PCAs are patent without evidence of significant stenosis. Intracranial internal carotid arteries are patent without evidence  of significant stenosis. ACAs and MCAs are patent without evidence of major branch occlusion or significant stenosis. No intracranial aneurysm is identified. MRA NECK FINDINGS Three vessel aortic arch. Brachiocephalic and subclavian arteries are patent, although the proximal left subclavian artery was incompletely imaged on the contrast-enhanced MRA. The common carotid arteries are widely patent. Changes of fibromuscular dysplasia are again seen diffusely involving the cervical internal carotid arteries and vertebral arteries, with multiple regions of alternating narrowing and dilatation. Relatively long segment irregular narrowing of the right internal carotid artery beginning approximately 2.2 cm beyond its origin has slightly progressed from the prior study with up to approximately 60% stenosis. This variably involves a length of approximately 3.5 cm. Irregular narrowing of the mid left cervical ICA does not appear significantly changed. The vertebral arteries are patent and  codominant with antegrade flow bilaterally. The greatest degree of stenosis is again seen involving the left vertebral artery at the C1 level. Narrowing of both mid V2 segments has slightly increased. IMPRESSION: 1. No acute intracranial abnormality. 2. Negative head MRA. 3. Diffuse changes of fibromuscular dysplasia involving the cervical internal carotid arteries and vertebral arteries, mildly progressed from prior. Up to 60% stenosis of the right ICA. Electronically Signed   By: Sebastian Ache M.D.   On: 08/20/2015 19:35   Mr Brain Wo Contrast  08/20/2015  CLINICAL DATA:  Left eye fuzzy vision. Left cheek numbness. Off balance. History of fibromuscular dysplasia. EXAM: MR HEAD WITHOUT CONTRAST MR CIRCLE OF WILLIS WITHOUT CONTRAST MRA OF THE NECK WITHOUT AND WITH CONTRAST TECHNIQUE: Multiplanar and multiecho pulse sequences of the neck were obtained without and with intravenous contrast. Angiographic images of the neck were obtained using MRA technique without and with intravenous contast.; Multiplanar, multiecho pulse sequences of the brain and surrounding structures were obtained according to standard protocol without intravenous contrast.; Angiographic images of the Circle of Willis were obtained using MRA technique without intravenous contrast. CONTRAST:  9mL MULTIHANCE GADOBENATE DIMEGLUMINE 529 MG/ML IV SOLN COMPARISON:  Head CT 08/20/2015. Head MRI and neck MRA 01/15/2012. Head MRA 02/07/2007. FINDINGS: MR HEAD FINDINGS There is no evidence of acute infarct, intracranial hemorrhage, mass, midline shift, or extra-axial fluid collection. Mild cerebral atrophy in the parietal regions does not appear significantly changed from the prior MRI. No significant cerebral white matter disease is seen. Orbits are unremarkable. Paranasal sinuses and mastoid air cells are clear. Major intracranial vascular flow voids are preserved. MR CIRCLE OF WILLIS FINDINGS The intracranial vertebral arteries are widely patent and  codominant. PICA and SCA origins are patent. Basilar artery is patent without stenosis. There are patent posterior communicating arteries bilaterally. PCAs are patent without evidence of significant stenosis. Intracranial internal carotid arteries are patent without evidence of significant stenosis. ACAs and MCAs are patent without evidence of major branch occlusion or significant stenosis. No intracranial aneurysm is identified. MRA NECK FINDINGS Three vessel aortic arch. Brachiocephalic and subclavian arteries are patent, although the proximal left subclavian artery was incompletely imaged on the contrast-enhanced MRA. The common carotid arteries are widely patent. Changes of fibromuscular dysplasia are again seen diffusely involving the cervical internal carotid arteries and vertebral arteries, with multiple regions of alternating narrowing and dilatation. Relatively long segment irregular narrowing of the right internal carotid artery beginning approximately 2.2 cm beyond its origin has slightly progressed from the prior study with up to approximately 60% stenosis. This variably involves a length of approximately 3.5 cm. Irregular narrowing of the mid left cervical ICA does not appear significantly changed.  The vertebral arteries are patent and codominant with antegrade flow bilaterally. The greatest degree of stenosis is again seen involving the left vertebral artery at the C1 level. Narrowing of both mid V2 segments has slightly increased. IMPRESSION: 1. No acute intracranial abnormality. 2. Negative head MRA. 3. Diffuse changes of fibromuscular dysplasia involving the cervical internal carotid arteries and vertebral arteries, mildly progressed from prior. Up to 60% stenosis of the right ICA. Electronically Signed   By: Sebastian Ache M.D.   On: 08/20/2015 19:35   Mr Thoracic Spine Wo Contrast  08/21/2015  CLINICAL DATA:  Thoracic back pain that comes and goes over months. Pain is below the right scapula.  EXAM: MRI THORACIC SPINE WITHOUT CONTRAST TECHNIQUE: Multiplanar, multisequence MR imaging of the thoracic spine was performed. No intravenous contrast was administered. COMPARISON:  None. FINDINGS: Normal thoracic cord signal and morphology. Subtle linear hyperintensity in the anterior cord at the level of C7 on sagittal imaging may be Gibbs artifact or visible central canal. No true syrinx is suspected. There is T6 inferior endplate irregularity with central T2 hyperintensity and mild regional marrow edema. Negative for bone lesion or typical discitis. Diffuse disc narrowing and spondylotic endplate spurring. Tiny left paracentral disc protrusion at T6-7, without neural contact. No impingement of the canal or foraminal level. A lobulated hyperintense mass in the posterior right liver measuring 12 mm is likely cyst or hemangioma based on bright T2 signal. There is no indication of malignancy history. Fluid level in the partly visualized gallbladder, without discrete/rounded stone. IMPRESSION: 1. No stenosis or impingement to explain right-sided pain. 2. Mild disc degeneration. 3. T6 inferior endplate Schmorl's node which may be recent given associated edema. 4. 12 mm right liver lesion with signal favoring cyst or hemangioma. Electronically Signed   By: Marnee Spring M.D.   On: 08/21/2015 10:31    CBC  Recent Labs Lab 08/20/15 1247 08/20/15 1304 08/21/15 0711  WBC 5.2  --  5.6  HGB 14.0 16.0* 14.3  HCT 43.8 47.0* 43.8  PLT 266  --  240  MCV 88.0  --  86.9  MCH 28.1  --  28.4  MCHC 32.0  --  32.6  RDW 13.6  --  13.6  LYMPHSABS 1.6  --   --   MONOABS 0.3  --   --   EOSABS 0.1  --   --   BASOSABS 0.0  --   --     Chemistries   Recent Labs Lab 08/20/15 1247 08/20/15 1304 08/21/15 0711  NA 144 144 141  K 3.7 3.7 4.0  CL 104 105 104  CO2 28  --  29  GLUCOSE 87 83 87  BUN 11 10 12   CREATININE 0.82 0.80 1.00  CALCIUM 9.8  --  9.6  AST 32  --   --   ALT 22  --   --   ALKPHOS 82  --    --   BILITOT 0.9  --   --    ------------------------------------------------------------------------------------------------------------------ estimated creatinine clearance is 44.9 mL/min (by C-G formula based on Cr of 1). ------------------------------------------------------------------------------------------------------------------  Recent Labs  08/21/15 0717  HGBA1C 5.7*   ------------------------------------------------------------------------------------------------------------------  Recent Labs  08/21/15 0717  CHOL 284*  HDL 68  LDLCALC 205*  TRIG 56  CHOLHDL 4.2   ------------------------------------------------------------------------------------------------------------------ No results for input(s): TSH, T4TOTAL, T3FREE, THYROIDAB in the last 72 hours.  Invalid input(s): FREET3 ------------------------------------------------------------------------------------------------------------------ No results for input(s): VITAMINB12, FOLATE, FERRITIN, TIBC, IRON, RETICCTPCT in the last 72  hours.  Coagulation profile  Recent Labs Lab 08/20/15 1247  INR 1.09    No results for input(s): DDIMER in the last 72 hours.  Cardiac Enzymes No results for input(s): CKMB, TROPONINI, MYOGLOBIN in the last 168 hours.  Invalid input(s): CK ------------------------------------------------------------------------------------------------------------------ Invalid input(s): POCBNP   Recent Labs  08/20/15 1238  GLUCAP 80     Beyonca Wisz M.D. [facility] 08/22/2015, 6:17 PM  Pager: [phone] Between 7am to 7pm - call Pager - [phone]  After 7pm go to [email or web address] - password [email or web address]  Call night coverage person covering after 7pm

## 2015-08-22 NOTE — Sedation Documentation (Signed)
ETCO2 removed per Dr. Deveshwar 

## 2015-08-22 NOTE — Progress Notes (Addendum)
STROKE TEAM PROGRESS NOTE   SUBJECTIVE (INTERVAL HISTORY) No family is at the bedside.  Overall Cheryl [name] feels Cheryl [name] condition is stable. Cheryl [name] is ready for Cheryl [name] angio today.     OBJECTIVE Temp:  [97.5 F (36.4 C)-97.9 F (36.6 C)] 97.8 F (36.6 C) (01/19 0659) Pulse Rate:  [69-97] 80 (01/19 0659) Cardiac Rhythm:  [-] Normal sinus rhythm (01/18 1900) Resp:  [18] 18 (01/19 0659) BP: (117-149)/(66-78) 130/70 mmHg (01/19 0659) SpO2:  [97 %-100 %] 100 % (01/19 0659)  CBC:   Recent Labs Lab 08/20/15 1247 08/20/15 1304 08/21/15 0711  WBC 5.2  --  5.6  NEUTROABS 3.2  --   --   HGB 14.0 16.0* 14.3  HCT 43.8 47.0* 43.8  MCV 88.0  --  86.9  PLT 266  --  240    Basic Metabolic Panel:   Recent Labs Lab 08/20/15 1247 08/20/15 1304 08/21/15 0711  NA 144 144 141  K 3.7 3.7 4.0  CL 104 105 104  CO2 28  --  29  GLUCOSE 87 83 87  BUN CREATININE 0.82 0.80 1.00  CALCIUM 9.8  --  9.6    Lipid Panel:     Component Value Date/Time   CHOL 284* 08/21/2015 0717   TRIG 56 08/21/2015 0717   HDL 68 08/21/2015 0717   CHOLHDL 4.2 08/21/2015 0717   VLDL 11 08/21/2015 0717   LDLCALC 205* 08/21/2015 0717   HgbA1c:  Lab Results  Component Value Date   HGBA1C 5.7* 08/21/2015   Urine Drug Screen: No results found for: LABOPIA, COCAINSCRNUR, LABBENZ, AMPHETMU, THCU, LABBARB    IMAGING  Ct Head Wo Contrast 08/20/2015  No intracranial mass, hemorrhage, or focal gray -white compartment lesions/acute appearing infarct.   Mr Brain Wo Contrast 08/20/2015  1. No acute intracranial abnormality.   Mr Angiogram Head Wo Contrast 08/20/2015   Negative head MRA.   Mr Angiogram Neck W Wo Contrast 08/20/2015  Diffuse changes of fibromuscular dysplasia involving the cervical internal carotid arteries and vertebral arteries, mildly progressed from prior. Up to 60% stenosis of the right ICA.   2D Echocardiogram  - Left ventricle: The cavity size was normal. Wall thickness wasnormal. Systolic  function was normal. The estimated ejectionfraction was in the range of 55% to 60%. Wall motion was normal;there were no regional wall motion abnormalities. Dopplerparameters are consistent with abnormal left ventricularrelaxation (grade 1 diastolic dysfunction). - Impressions:  Normal LV systolic function; grade 1 diastolic dysfunction; traceMR and TR.    PHYSICAL EXAM Cheryl [name] currently not in distress. . Afebrile. Head is nontraumatic. Neck is supple without bruit.    Cardiac exam no murmur or gallop. Lungs are clear to auscultation. Distal pulses are well felt. Neurological Exam ;  Awake  Alert oriented x 3. Normal speech and language.eye movements full without nystagmus.fundi were not visualized. Vision acuity and fields appear normal. Hearing is normal. Palatal movements are normal. Face symmetric. Tongue midline. Normal strength, tone, reflexes and coordination. Normal sensation. Gait deferred.   ASSESSMENT/PLAN Cheryl [name] is a 55 y.o. female with history of fibromuscular dysplasia presenting with transient left-sided numbness and tongue deviation. Cheryl [name] did not receive IV t-PA due to unclear time of onset.   TIA FMD  MRI  No acute stroke  MRA  Unremarkable   MRA neck changes associated with fibromuscular dysplasia  Carotid Doppler  canceled  2D Echo  No source of embolus   cerebral angio today to  look at FMD, ? Percentage of blockage. To look at RAS as well  LDL 205  HgbA1c 5.7  Lovenox 40 mg sq daily for VTE prophylaxis Diet NPO time specified Except for: Sips with Meds  aspirin 81 mg daily listed as prior to admission, however, pt states Cheryl [name] was not taking, now on aspirin 325 mg daily continue at discharge  Cheryl [name] counseled to be compliant with Cheryl [name] antithrombotic medications  Ongoing aggressive stroke risk factor management  Recommend pt see an opthamologist to eval vision further given blurred vision and pain in Cheryl [name]  eye, ? clot  Therapy recommendations:  pending   Disposition:  Anticipate return home  Hypertensive Emergency  BP as high as 164/101, 155/102 on arrival yesterday  ? etiololgy of HTN. Has not had eval for RAS. Have ased Dr. Algis Downs to look at kidneys when he does cerebral angio  Hyperlipidemia  Home meds:  No statin  LDL 205, goal < 70  Started on lipitor 40  Continue statin at discharge  Hospital day # 2  Rhoderick Moody [facility] See Amion for Pager information 08/22/2015 9:22 AM  I have personally examined this Cheryl [name], reviewed notes, independently viewed imaging studies, participated in medical decision making and plan of care. I have made any additions or clarifications directly to the above note. Agree with note above.  Cheryl [name] will be discharged after catheter angiogram. Follow-up as an outpatient in stroke clinic in 2 months. Cheryl [name] was advised to have outpatient ophthalmology evaluation for left eye  Delia Heady, MD Medical Director [facility] Pager: [phone] 08/22/2015 2:44 PM   To contact Stroke Continuity provider, please refer to [email or web address]. After hours, contact General Neurology

## 2015-08-22 NOTE — Progress Notes (Signed)
Patient had uneventful night .  Just slight numbness and pain to left eye per pt. Lovenox held last night. NPO for IR procedure today. No  change in neuro status  last night.

## 2015-08-22 NOTE — Sedation Documentation (Signed)
Patient is resting comfortably. 

## 2015-08-22 NOTE — Sedation Documentation (Signed)
5 fr. Exoseal to right groin 

## 2015-08-22 NOTE — Procedures (Signed)
S/P 4  vessel cerebral arteriogram.  abdominal aortogram. Rt CFa approach. Findings. 1.Mod to severe non occlusive  FMD changes in carotids and vertebral arteries extracranially. 2.No angio evidence of FMD involving the renal arteries.

## 2015-08-22 NOTE — Progress Notes (Signed)
Patient returned from interventional radiology from having a cerebral angiogram which originated in the right groin, the right groin started to bleed and I had to call IR and they sent [place] up to assess, [place] held pressure for 15 minutes and reapplied a pressure dressing.

## 2015-08-22 NOTE — Care Management Note (Signed)
Case Management Note  Patient Details  Name: Cheryl [name] MRN: [id number] Date of Birth: 1961-02-28  Subjective/Objective:                    Action/Plan:MD awaiting results of Renal/Carotid Artery Angio Today in IR. If WNL will begin to plan for discharge.    Expected Discharge Date:   (unknown)               Expected Discharge Plan:     In-House Referral:     Discharge planning Services     Post Acute Care Choice:    Choice offered to:     DME Arranged:    DME Agency:     HH Arranged:    HH Agency:     Status of Service:     Medicare Important Message Given:    Date Medicare IM Given:    Medicare IM give by:    Date Additional Medicare IM Given:    Additional Medicare Important Message give by:     If discussed at Long Length of Stay Meetings, dates discussed:    Additional Comments:  Yvone Neu, RN 08/22/2015, 11:43 AM

## 2015-08-23 LAB — BASIC METABOLIC PANEL
Anion gap: 9 (ref 5–15)
BUN: 16 mg/dL (ref 6–20)
CALCIUM: 9.9 mg/dL (ref 8.9–10.3)
CHLORIDE: 106 mmol/L (ref 101–111)
CO2: 30 mmol/L (ref 22–32)
CREATININE: 0.98 mg/dL (ref 0.44–1.00)
GFR calc Af Amer: >60 mL/min (ref >60)
GFR calc non Af Amer: >60 mL/min (ref >60)
GLUCOSE: 90 mg/dL (ref 65–99)
Potassium: 4.5 mmol/L (ref 3.5–5.1)
Sodium: 145 mmol/L (ref 135–145)

## 2015-08-23 LAB — CBC
HEMATOCRIT: 43.6 % (ref 36.0–46.0)
HEMOGLOBIN: 13.7 g/dL (ref 12.0–15.0)
MCH: 27.8 pg (ref 26.0–34.0)
MCHC: 31.4 g/dL (ref 30.0–36.0)
MCV: 88.6 fL (ref 78.0–100.0)
Platelets: 223 10*3/uL (ref 150–400)
RBC: 4.92 MIL/uL (ref 3.87–5.11)
RDW: 13.9 % (ref 11.5–15.5)
WBC: 7.2 10*3/uL (ref 4.0–10.5)

## 2015-08-23 MED ORDER — ASPIRIN 325 MG PO TABS: 325.0000 mg | ORAL_TABLET | Freq: Every day | ORAL | Status: DC

## 2015-08-23 MED ORDER — ATORVASTATIN CALCIUM 40 MG PO TABS
40.0000 mg | ORAL_TABLET | Freq: Every day | ORAL | Status: DC
Start: 2015-08-23 — End: 2015-09-02

## 2015-08-23 MED ORDER — AMLODIPINE BESYLATE 2.5 MG PO TABS: 5.0000 mg | ORAL_TABLET | Freq: Every day | ORAL | Status: DC

## 2015-08-23 NOTE — Care Management Note (Signed)
Case Management Note  Patient Details  Name: Cheryl [name] MRN: [id number] Date of Birth: 1961/03/08  Subjective/Objective:                    Action/Plan: Patient discharging home today with self care. No further needs per CM.   Expected Discharge Date:   (unknown)               Expected Discharge Plan:  Home/Self Care  In-House Referral:     Discharge planning Services     Post Acute Care Choice:    Choice offered to:     DME Arranged:    DME Agency:     HH Arranged:    HH Agency:     Status of Service:  Completed, signed off  Medicare Important Message Given:    Date Medicare IM Given:    Medicare IM give by:    Date Additional Medicare IM Given:    Additional Medicare Important Message give by:     If discussed at Long Length of Stay Meetings, dates discussed:    Additional Comments:  Kermit Balo, RN 08/23/2015, 10:09 AM

## 2015-08-23 NOTE — Progress Notes (Signed)
Pt discharged from hospital per orders from MD. Pt educated on discharge instructions. Pt verbalized understanding of instructions. Pt's IV was removed before discharge. All questions and concerns were addressed. Pt exited hospital of own ambulation.

## 2015-08-23 NOTE — Progress Notes (Signed)
STROKE TEAM PROGRESS NOTE   SUBJECTIVE (INTERVAL HISTORY) No family is at the bedside.  Overall she feels her condition is stable. She had catheter angio yesterday which showed moderate to severe nonocclusive fibromuscular dysplasia changes in both carotids and vertebral  arteries in the extracranial segments. Renal arteries showed no evidence of fibromuscular dysplasia or stenosis.    OBJECTIVE Temp:  [97.3 F (36.3 C)-98.4 F (36.9 C)] 98.3 F (36.8 C) (01/20 1020) Pulse Rate:  [69-102] 97 (01/20 1020) Cardiac Rhythm:  [-] Normal sinus rhythm (01/20 0704) Resp:  [16-20] 18 (01/20 1020) BP: (117-143)/(56-78) 143/78 mmHg (01/20 1020) SpO2:  [96 %-100 %] 96 % (01/20 1020)  CBC:   Recent Labs Lab 08/20/15 1247  08/21/15 0711 08/23/15 0349  WBC 5.2  --  5.6 7.2  NEUTROABS 3.2  --   --   --   HGB 14.0  < > 14.3 13.7  HCT 43.8  < > 43.8 43.6  MCV 88.0  --  86.9 88.6  PLT 266  --  240 223  < > = values in this interval not displayed.  Basic Metabolic Panel:   Recent Labs Lab 08/21/15 0711 08/23/15 0349  NA 141 145  K 4.0 4.5  CL 104 106  CO2 29 30  GLUCOSE 87 90  BUN 12 16  CREATININE 1.00 0.98  CALCIUM 9.6 9.9    Lipid Panel:     Component Value Date/Time   CHOL 284* 08/21/2015 0717   TRIG 56 08/21/2015 0717   HDL 68 08/21/2015 0717   CHOLHDL 4.2 08/21/2015 0717   VLDL 11 08/21/2015 0717   LDLCALC 205* 08/21/2015 0717   HgbA1c:  Lab Results  Component Value Date   HGBA1C 5.7* 08/21/2015   Urine Drug Screen: No results found for: LABOPIA, COCAINSCRNUR, LABBENZ, AMPHETMU, THCU, LABBARB    IMAGING  Ct Head Wo Contrast 08/20/2015  No intracranial mass, hemorrhage, or focal gray -white compartment lesions/acute appearing infarct.   Mr Brain Wo Contrast 08/20/2015  1. No acute intracranial abnormality.   Mr Angiogram Head Wo Contrast 08/20/2015   Negative head MRA.   Mr Angiogram Neck W Wo Contrast 08/20/2015  Diffuse changes of fibromuscular dysplasia  involving the cervical internal carotid arteries and vertebral arteries, mildly progressed from prior. Up to 60% stenosis of the right ICA.   2D Echocardiogram  - Left ventricle: The cavity size was normal. Wall thickness wasnormal. Systolic function was normal. The estimated ejectionfraction was in the range of 55% to 60%. Wall motion was normal;there were no regional wall motion abnormalities. Dopplerparameters are consistent with abnormal left ventricularrelaxation (grade 1 diastolic dysfunction). - Impressions:  Normal LV systolic function; grade 1 diastolic dysfunction; traceMR and TR.    PHYSICAL EXAM Pleasant middle-aged African-American lady currently not in distress. . Afebrile. Head is nontraumatic. Neck is supple without bruit.    Cardiac exam no murmur or gallop. Lungs are clear to auscultation. Distal pulses are well felt. Neurological Exam ;  Awake  Alert oriented x 3. Normal speech and language.eye movements full without nystagmus.fundi were not visualized. Vision acuity and fields appear normal. Hearing is normal. Palatal movements are normal. Face symmetric. Tongue midline. Normal strength, tone, reflexes and coordination. Normal sensation. Gait deferred.   ASSESSMENT/PLAN Ms. MARIONNA GONIA is a 55 y.o. female with history of fibromuscular dysplasia presenting with transient left-sided numbness and tongue deviation. She did not receive IV t-PA due to unclear time of onset.   TIA FMD nonocclusive stenosis medical management  only  MRI  No acute stroke  MRA  Unremarkable   MRA neck changes associated with fibromuscular dysplasia  Carotid Doppler  canceled  2D Echo  No source of embolus   cerebral angio today to look at FMD, ? Percentage of blockage. To look at RAS as well  LDL 205  HgbA1c 5.7  Lovenox 40 mg sq daily for VTE prophylaxis Diet Heart Room service appropriate?: Yes; Fluid consistency:: Thin Diet - low sodium heart healthy  aspirin 81 mg daily  listed as prior to admission, however, pt states she was not taking, now on aspirin 325 mg daily continue at discharge  Patient counseled to be compliant with her antithrombotic medications  Ongoing aggressive stroke risk factor management  Recommend pt see an opthamologist to eval vision further given blurred vision and pain in her eye, ? clot  Therapy recommendations:  pending   Disposition:  Anticipate return home  Hypertensive Emergency  BP as high as 164/101, 155/102 on arrival yesterday  ? etiololgy of HTN. Has not had eval for RAS. Have ased Dr. Algis Downs to look at kidneys when he does cerebral angio  Hyperlipidemia  Home meds:  No statin  LDL 205, goal < 70  Started on lipitor 40  Continue statin at discharge  Hospital day # 3  SETHI,PRAMOD  [facility] See Amion for Pager information 08/23/2015 12:55 PM  I have personally examined this patient, reviewed notes, independently viewed imaging studies, participated in medical decision making and plan of care. I have made any additions or clarifications directly to the above note. Agree with note above.  Patient will be discharged today Follow-up as an outpatient in stroke clinic in 2 months. She was advised to have outpatient ophthalmology evaluation for left eye  Delia Heady, MD Medical Director [facility] Pager: [phone] 08/23/2015 12:55 PM   To contact Stroke Continuity provider, please refer to [email or web address]. After hours, contact General Neurology

## 2015-08-23 NOTE — Discharge Summary (Signed)
Physician Discharge Summary   Patient ID: Cheryl [name] MRN: [id number] DOB/AGE: 11-12-1960 55 y.o.  Admit date: 08/20/2015 Discharge date: 08/23/2015  Primary Care Physician:  Rogelia Boga, MD  Discharge Diagnoses:    . Left-sided weakness . TIA (transient ischemic attack) Hypertension Carotid fibromuscular dysplasia Chronic thoracic back pain  Consults: Neurology Neuro interventional radiology, Dr. Corliss Skains  Recommendations for Outpatient Follow-up:  1. Please repeat CBC/BMET at next visit 2. Neurology recommended aspirin 325 mg daily, statin   DIET: Heart healthy diet    Allergies:   Allergies  Allergen Reactions  . Cefazolin Hives and Itching  . Sulfonamide Derivatives Hives and Itching     DISCHARGE MEDICATIONS: Current Discharge Medication List    START taking these medications   Details  amLODipine (NORVASC) 2.5 MG tablet Take 2 tablets (5 mg total) by mouth daily. Qty: 30 tablet, Refills: 3    atorvastatin (LIPITOR) 40 MG tablet Take 1 tablet (40 mg total) by mouth at bedtime. Qty: 30 tablet, Refills: 5      CONTINUE these medications which have CHANGED   Details  aspirin 325 MG tablet Take 1 tablet (325 mg total) by mouth daily. Qty: 30 tablet, Refills: 5      CONTINUE these medications which have NOT CHANGED   Details  calcium carbonate (TUMS - DOSED IN MG ELEMENTAL CALCIUM) 500 MG chewable tablet Chew 1 tablet by mouth 2 (two) times daily.    cetirizine (ZYRTEC) 10 MG tablet Take 10 mg by mouth as needed for allergies.     ibuprofen (ADVIL,MOTRIN) 200 MG tablet Take 400 mg by mouth every 6 (six) hours as needed for headache or moderate pain.    Multiple Vitamin (MULTIVITAMIN WITH MINERALS) TABS tablet Take 1 tablet by mouth daily.    sodium chloride (OCEAN) 0.65 % SOLN nasal spray Place 2 sprays into both nostrils as needed for congestion.         Brief H and P: For complete details please refer to admission H and P, but  in brief Cheryl [name] is a 55 y.o. 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 She also has persistent blurred vision as well as throbbing behind her left eye, she presented to [facility] ED, Ct head no acute findings. Patient was admitted to [facility] for further workup for TIA/CVA.  Hospital Course:  TIA: Left sided numbness/tonue deviation now has largely resolved, persistent blurry vision and throbbing pain behind left eye, though nontender on palpation to left temporal forehead - CT head with no acute findings. Neurology was consulted. - MRI brain showed no acute intracranial abnormalities - MRA showed normal intracranial circulation except diffuse changes of fibromuscular dysplasia, per patient known chronic issue. - 2-D echo showed EF of 55-60%, grade 1 diastolic dysfunction  - Carotid Dopplers canceled as patient to have cerebral angiogram. MRA neck showed either muscular dysplasia - Neurology recommended aspirin 325 mg daily - Lipid panel showed LDL 205, goal less than 70, placed on statin - Hemoglobin A1c 5.7    HTN; patient has not been on meds for a while.  -Patient was placed on Norvasc 2.5 mg daily at the time of discharge  H/o carotid fibromuscular dysplasia, MRA neck pending. Had been followed by neurology and vascular surgery. - Cerebral angiogram and renal angiogram done which  showed moderate to severe nonocclusive FMD changes in the carotids and vertebral arteries extracranially. No angiographic evidence of FMD involving the renal arteries.     thoracic back pain for several months - MRI showed no stenosis or impingement, mild disc degeneration, 12 mm right liver lesion favoring cyst or hemangioma -Outpatient follow-up with PCP and further workup if needed    Day of  Discharge BP 143/78 mmHg  Pulse 97  Temp(Src) 98.3 F (36.8 C) (Oral)  Resp 18  Ht 5' (1.524 m)  Wt 44.679 kg (98 lb 8 oz)  BMI 19.24 kg/m2  SpO2 96%  LMP 12/22/2011  Physical Exam: General: Alert and awake oriented x3 not in any acute distress. HEENT: anicteric sclera, pupils reactive to light and accommodation CVS: S1-S2 clear no murmur rubs or gallops Chest: clear to auscultation bilaterally, no wheezing rales or rhonchi Abdomen: soft nontender, nondistended, normal bowel sounds Extremities: no cyanosis, clubbing or edema noted bilaterally Neuro: Cranial nerves II-XII intact, no focal neurological deficits   The results of significant diagnostics from this hospitalization (including imaging, microbiology, ancillary and laboratory) are listed below for reference.    LAB RESULTS: Basic Metabolic Panel:  Recent Labs Lab 08/21/15 0711 08/23/15 0349  NA 141 145  K 4.0 4.5  CL 104 106  CO2 29 30  GLUCOSE 87 90  BUN 12 16  CREATININE 1.00 0.98  CALCIUM 9.6 9.9   Liver Function Tests:  Recent Labs Lab 08/20/15 1247  AST 32  ALT 22  ALKPHOS 82  BILITOT 0.9  PROT 8.1  ALBUMIN 5.3*   No results for input(s): LIPASE, AMYLASE in the last 168 hours. No results for input(s): AMMONIA in the last 168 hours. CBC:  Recent Labs Lab 08/20/15 1247  08/21/15 0711 08/23/15 0349  WBC 5.2  --  5.6 7.2  NEUTROABS 3.2  --   --   --   HGB 14.0  < > 14.3 13.7  HCT 43.8  < > 43.8 43.6  MCV 88.0  --  86.9 88.6  PLT 266  --  240 223  < > = values in this interval not displayed. Cardiac Enzymes: No results for input(s): CKTOTAL, CKMB, CKMBINDEX, TROPONINI in the last 168 hours. BNP: Invalid input(s): POCBNP CBG:  Recent Labs Lab 08/20/15 1238  GLUCAP 80    Significant Diagnostic Studies:  Ct Head Wo Contrast  08/20/2015  CLINICAL DATA:  Intermittent dizziness for 3 days. Blurred vision left eye. Facial numbness for 1 day EXAM: CT HEAD WITHOUT CONTRAST TECHNIQUE:  Contiguous axial images were obtained from the base of the skull through the vertex without intravenous contrast. COMPARISON:  Brain MRI January 16, 2012 FINDINGS: The ventricles are normal in size and configuration. Borderline parietal lobe atrophy is stable. There is no intracranial mass, hemorrhage, extra-axial fluid collection, or midline shift. Gray-white compartments appear normal. No acute infarct evident. Bony calvarium appears intact. Mastoid air cells are clear. No intraorbital lesions are demonstrable. A punctate calcification in the right basal ganglia is likely physiologic. IMPRESSION: No intracranial mass, hemorrhage, or focal gray -white compartment lesions/acute appearing infarct. Electronically Signed   By: Bretta Bang III M.D.   On: 08/20/2015 13:37   Mr Angiogram Head Wo Contrast  08/20/2015  CLINICAL DATA:  Left eye fuzzy vision. Left cheek numbness. Off balance. History of fibromuscular dysplasia. EXAM: MR HEAD WITHOUT CONTRAST MR CIRCLE OF WILLIS WITHOUT CONTRAST MRA OF THE NECK WITHOUT AND WITH CONTRAST TECHNIQUE: Multiplanar and multiecho pulse sequences of the neck  were obtained without and with intravenous contrast. Angiographic images of the neck were obtained using MRA technique without and with intravenous contast.; Multiplanar, multiecho pulse sequences of the brain and surrounding structures were obtained according to standard protocol without intravenous contrast.; Angiographic images of the Circle of Willis were obtained using MRA technique without intravenous contrast. CONTRAST:  9mL MULTIHANCE GADOBENATE DIMEGLUMINE 529 MG/ML IV SOLN COMPARISON:  Head CT 08/20/2015. Head MRI and neck MRA 01/15/2012. Head MRA 02/07/2007. FINDINGS: MR HEAD FINDINGS There is no evidence of acute infarct, intracranial hemorrhage, mass, midline shift, or extra-axial fluid collection. Mild cerebral atrophy in the parietal regions does not appear significantly changed from the prior MRI. No  significant cerebral white matter disease is seen. Orbits are unremarkable. Paranasal sinuses and mastoid air cells are clear. Major intracranial vascular flow voids are preserved. MR CIRCLE OF WILLIS FINDINGS The intracranial vertebral arteries are widely patent and codominant. PICA and SCA origins are patent. Basilar artery is patent without stenosis. There are patent posterior communicating arteries bilaterally. PCAs are patent without evidence of significant stenosis. Intracranial internal carotid arteries are patent without evidence of significant stenosis. ACAs and MCAs are patent without evidence of major branch occlusion or significant stenosis. No intracranial aneurysm is identified. MRA NECK FINDINGS Three vessel aortic arch. Brachiocephalic and subclavian arteries are patent, although the proximal left subclavian artery was incompletely imaged on the contrast-enhanced MRA. The common carotid arteries are widely patent. Changes of fibromuscular dysplasia are again seen diffusely involving the cervical internal carotid arteries and vertebral arteries, with multiple regions of alternating narrowing and dilatation. Relatively long segment irregular narrowing of the right internal carotid artery beginning approximately 2.2 cm beyond its origin has slightly progressed from the prior study with up to approximately 60% stenosis. This variably involves a length of approximately 3.5 cm. Irregular narrowing of the mid left cervical ICA does not appear significantly changed. The vertebral arteries are patent and codominant with antegrade flow bilaterally. The greatest degree of stenosis is again seen involving the left vertebral artery at the C1 level. Narrowing of both mid V2 segments has slightly increased. IMPRESSION: 1. No acute intracranial abnormality. 2. Negative head MRA. 3. Diffuse changes of fibromuscular dysplasia involving the cervical internal carotid arteries and vertebral arteries, mildly progressed  from prior. Up to 60% stenosis of the right ICA. Electronically Signed   By: Sebastian Ache M.D.   On: 08/20/2015 19:35   Mr Angiogram Neck W Wo Contrast  08/20/2015  CLINICAL DATA:  Left eye fuzzy vision. Left cheek numbness. Off balance. History of fibromuscular dysplasia. EXAM: MR HEAD WITHOUT CONTRAST MR CIRCLE OF WILLIS WITHOUT CONTRAST MRA OF THE NECK WITHOUT AND WITH CONTRAST TECHNIQUE: Multiplanar and multiecho pulse sequences of the neck were obtained without and with intravenous contrast. Angiographic images of the neck were obtained using MRA technique without and with intravenous contast.; Multiplanar, multiecho pulse sequences of the brain and surrounding structures were obtained according to standard protocol without intravenous contrast.; Angiographic images of the Circle of Willis were obtained using MRA technique without intravenous contrast. CONTRAST:  9mL MULTIHANCE GADOBENATE DIMEGLUMINE 529 MG/ML IV SOLN COMPARISON:  Head CT 08/20/2015. Head MRI and neck MRA 01/15/2012. Head MRA 02/07/2007. FINDINGS: MR HEAD FINDINGS There is no evidence of acute infarct, intracranial hemorrhage, mass, midline shift, or extra-axial fluid collection. Mild cerebral atrophy in the parietal regions does not appear significantly changed from the prior MRI. No significant cerebral white matter disease is seen. Orbits are unremarkable. Paranasal sinuses and mastoid  air cells are clear. Major intracranial vascular flow voids are preserved. MR CIRCLE OF WILLIS FINDINGS The intracranial vertebral arteries are widely patent and codominant. PICA and SCA origins are patent. Basilar artery is patent without stenosis. There are patent posterior communicating arteries bilaterally. PCAs are patent without evidence of significant stenosis. Intracranial internal carotid arteries are patent without evidence of significant stenosis. ACAs and MCAs are patent without evidence of major branch occlusion or significant stenosis. No  intracranial aneurysm is identified. MRA NECK FINDINGS Three vessel aortic arch. Brachiocephalic and subclavian arteries are patent, although the proximal left subclavian artery was incompletely imaged on the contrast-enhanced MRA. The common carotid arteries are widely patent. Changes of fibromuscular dysplasia are again seen diffusely involving the cervical internal carotid arteries and vertebral arteries, with multiple regions of alternating narrowing and dilatation. Relatively long segment irregular narrowing of the right internal carotid artery beginning approximately 2.2 cm beyond its origin has slightly progressed from the prior study with up to approximately 60% stenosis. This variably involves a length of approximately 3.5 cm. Irregular narrowing of the mid left cervical ICA does not appear significantly changed. The vertebral arteries are patent and codominant with antegrade flow bilaterally. The greatest degree of stenosis is again seen involving the left vertebral artery at the C1 level. Narrowing of both mid V2 segments has slightly increased. IMPRESSION: 1. No acute intracranial abnormality. 2. Negative head MRA. 3. Diffuse changes of fibromuscular dysplasia involving the cervical internal carotid arteries and vertebral arteries, mildly progressed from prior. Up to 60% stenosis of the right ICA. Electronically Signed   By: Sebastian Ache M.D.   On: 08/20/2015 19:35   Mr Brain Wo Contrast  08/20/2015  CLINICAL DATA:  Left eye fuzzy vision. Left cheek numbness. Off balance. History of fibromuscular dysplasia. EXAM: MR HEAD WITHOUT CONTRAST MR CIRCLE OF WILLIS WITHOUT CONTRAST MRA OF THE NECK WITHOUT AND WITH CONTRAST TECHNIQUE: Multiplanar and multiecho pulse sequences of the neck were obtained without and with intravenous contrast. Angiographic images of the neck were obtained using MRA technique without and with intravenous contast.; Multiplanar, multiecho pulse sequences of the brain and surrounding  structures were obtained according to standard protocol without intravenous contrast.; Angiographic images of the Circle of Willis were obtained using MRA technique without intravenous contrast. CONTRAST:  9mL MULTIHANCE GADOBENATE DIMEGLUMINE 529 MG/ML IV SOLN COMPARISON:  Head CT 08/20/2015. Head MRI and neck MRA 01/15/2012. Head MRA 02/07/2007. FINDINGS: MR HEAD FINDINGS There is no evidence of acute infarct, intracranial hemorrhage, mass, midline shift, or extra-axial fluid collection. Mild cerebral atrophy in the parietal regions does not appear significantly changed from the prior MRI. No significant cerebral white matter disease is seen. Orbits are unremarkable. Paranasal sinuses and mastoid air cells are clear. Major intracranial vascular flow voids are preserved. MR CIRCLE OF WILLIS FINDINGS The intracranial vertebral arteries are widely patent and codominant. PICA and SCA origins are patent. Basilar artery is patent without stenosis. There are patent posterior communicating arteries bilaterally. PCAs are patent without evidence of significant stenosis. Intracranial internal carotid arteries are patent without evidence of significant stenosis. ACAs and MCAs are patent without evidence of major branch occlusion or significant stenosis. No intracranial aneurysm is identified. MRA NECK FINDINGS Three vessel aortic arch. Brachiocephalic and subclavian arteries are patent, although the proximal left subclavian artery was incompletely imaged on the contrast-enhanced MRA. The common carotid arteries are widely patent. Changes of fibromuscular dysplasia are again seen diffusely involving the cervical internal carotid arteries and vertebral arteries, with  multiple regions of alternating narrowing and dilatation. Relatively long segment irregular narrowing of the right internal carotid artery beginning approximately 2.2 cm beyond its origin has slightly progressed from the prior study with up to approximately 60%  stenosis. This variably involves a length of approximately 3.5 cm. Irregular narrowing of the mid left cervical ICA does not appear significantly changed. The vertebral arteries are patent and codominant with antegrade flow bilaterally. The greatest degree of stenosis is again seen involving the left vertebral artery at the C1 level. Narrowing of both mid V2 segments has slightly increased. IMPRESSION: 1. No acute intracranial abnormality. 2. Negative head MRA. 3. Diffuse changes of fibromuscular dysplasia involving the cervical internal carotid arteries and vertebral arteries, mildly progressed from prior. Up to 60% stenosis of the right ICA. Electronically Signed   By: Sebastian Ache M.D.   On: 08/20/2015 19:35   Mr Thoracic Spine Wo Contrast  08/21/2015  CLINICAL DATA:  Thoracic back pain that comes and goes over months. Pain is below the right scapula. EXAM: MRI THORACIC SPINE WITHOUT CONTRAST TECHNIQUE: Multiplanar, multisequence MR imaging of the thoracic spine was performed. No intravenous contrast was administered. COMPARISON:  None. FINDINGS: Normal thoracic cord signal and morphology. Subtle linear hyperintensity in the anterior cord at the level of C7 on sagittal imaging may be Gibbs artifact or visible central canal. No true syrinx is suspected. There is T6 inferior endplate irregularity with central T2 hyperintensity and mild regional marrow edema. Negative for bone lesion or typical discitis. Diffuse disc narrowing and spondylotic endplate spurring. Tiny left paracentral disc protrusion at T6-7, without neural contact. No impingement of the canal or foraminal level. A lobulated hyperintense mass in the posterior right liver measuring 12 mm is likely cyst or hemangioma based on bright T2 signal. There is no indication of malignancy history. Fluid level in the partly visualized gallbladder, without discrete/rounded stone. IMPRESSION: 1. No stenosis or impingement to explain right-sided pain. 2. Mild  disc degeneration. 3. T6 inferior endplate Schmorl's node which may be recent given associated edema. 4. 12 mm right liver lesion with signal favoring cyst or hemangioma. Electronically Signed   By: Marnee Spring M.D.   On: 08/21/2015 10:31    2D ECHO: Study Conclusions  - Left ventricle: The cavity size was normal. Wall thickness was normal. Systolic function was normal. The estimated ejection fraction was in the range of 55% to 60%. Wall motion was normal; there were no regional wall motion abnormalities. Doppler parameters are consistent with abnormal left ventricular relaxation (grade 1 diastolic dysfunction).  Impressions:  - Normal LV systolic function; grade 1 diastolic dysfunction; trace MR and TR.  Disposition and Follow-up: Discharge Instructions    Diet - low sodium heart healthy    Complete by:  As directed      Increase activity slowly    Complete by:  As directed             DISPOSITION: home    DISCHARGE FOLLOW-UP Follow-up Information    Follow up with Rogelia Boga, MD. Schedule an appointment as soon as possible for a visit in 2 weeks.   Specialty:  Internal Medicine   Why:  for hospital follow-up   Contact information:   [address] Christena Flake [facility] [place] [address] [phone]       Follow up with SETHI,PRAMOD, MD. Schedule an appointment as soon as possible for a visit in 2 months.   Specialties:  Neurology, Radiology   Why:  for hospital follow-up for  TIA   Contact information:   [address] [place] [address] [phone]        Time spent on Discharge:   Signed:   Marvie Brevik M.D. [facility] 08/23/2015, 12:59 PM Pager: [phone]

## 2015-08-26 DIAGNOSIS — R51 Headache: Secondary | ICD-10-CM | POA: Diagnosis not present

## 2015-09-02 ENCOUNTER — Encounter: Payer: Self-pay | Admitting: Internal Medicine

## 2015-09-02 ENCOUNTER — Ambulatory Visit (INDEPENDENT_AMBULATORY_CARE_PROVIDER_SITE_OTHER): Payer: 59 | Admitting: Internal Medicine

## 2015-09-02 ENCOUNTER — Other Ambulatory Visit: Payer: Self-pay | Admitting: *Deleted

## 2015-09-02 VITALS — BP 130/90 | HR 104 | Temp 98.5°F | Resp 18 | Ht 60.0 in | Wt 97.0 lb

## 2015-09-02 DIAGNOSIS — I773 Arterial fibromuscular dysplasia: Secondary | ICD-10-CM | POA: Diagnosis not present

## 2015-09-02 DIAGNOSIS — E785 Hyperlipidemia, unspecified: Secondary | ICD-10-CM | POA: Insufficient documentation

## 2015-09-02 DIAGNOSIS — I10 Essential (primary) hypertension: Secondary | ICD-10-CM | POA: Diagnosis not present

## 2015-09-02 DIAGNOSIS — G459 Transient cerebral ischemic attack, unspecified: Secondary | ICD-10-CM | POA: Diagnosis not present

## 2015-09-02 MED ORDER — ATORVASTATIN CALCIUM 40 MG PO TABS: 40.0000 mg | ORAL_TABLET | Freq: Every day | ORAL | Status: DC

## 2015-09-02 MED ORDER — CETIRIZINE HCL 10 MG PO TABS: 10.0000 mg | ORAL_TABLET | ORAL | Status: DC | PRN

## 2015-09-02 MED ORDER — AMLODIPINE BESYLATE 2.5 MG PO TABS: 5.0000 mg | ORAL_TABLET | Freq: Every day | ORAL | Status: DC

## 2015-09-02 MED ORDER — SALINE SPRAY 0.65 % NA SOLN: 2.0000 | NASAL | Status: DC | PRN

## 2015-09-02 NOTE — Patient Instructions (Signed)
Limit your sodium (Salt) intake  Please check your blood pressure on a regular basis.  If it is consistently greater than 150/90, please make an office appointment.    It is important that you exercise regularly, at least 20 minutes 3 to 4 times per week.  If you develop chest pain or shortness of breath seek  medical attention.  Fat and Cholesterol Restricted Diet High levels of fat and cholesterol in your blood may lead to various health problems, such as diseases of the heart, blood vessels, gallbladder, liver, and pancreas. Fats are concentrated sources of energy that come in various forms. Certain types of fat, including saturated fat, may be harmful in excess. Cholesterol is a substance needed by your body in small amounts. Your body makes all the cholesterol it needs. Excess cholesterol comes from the food you eat. When you have high levels of cholesterol and saturated fat in your blood, health problems can develop because the excess fat and cholesterol will gather along the walls of your blood vessels, causing them to narrow. Choosing the right foods will help you control your intake of fat and cholesterol. This will help keep the levels of these substances in your blood within normal limits and reduce your risk of disease. WHAT IS MY PLAN? Your health care provider recommends that you:  Get no more than __________ % of the total calories in your daily diet from fat.  Limit your intake of saturated fat to less than ______% of your total calories each day.  Limit the amount of cholesterol in your diet to less than _________mg per day. WHAT TYPES OF FAT SHOULD I CHOOSE?  Choose healthy fats more often. Choose monounsaturated and polyunsaturated fats, such as olive and canola oil, flaxseeds, walnuts, almonds, and seeds.  Eat more omega-3 fats. Good choices include salmon, mackerel, sardines, tuna, flaxseed oil, and ground flaxseeds. Aim to eat fish at least two times a week.  Limit  saturated fats. Saturated fats are primarily found in animal products, such as meats, butter, and cream. Plant sources of saturated fats include palm oil, palm kernel oil, and coconut oil.  Avoid foods with partially hydrogenated oils in them. These contain trans fats. Examples of foods that contain trans fats are stick margarine, some tub margarines, cookies, crackers, and other baked goods. WHAT GENERAL GUIDELINES DO I NEED TO FOLLOW? These guidelines for healthy eating will help you control your intake of fat and cholesterol:  Check food labels carefully to identify foods with trans fats or high amounts of saturated fat.  Fill one half of your plate with vegetables and green salads.  Fill one fourth of your plate with whole grains. Look for the word "whole" as the first word in the ingredient list.  Fill one fourth of your plate with lean protein foods.  Limit fruit to two servings a day. Choose fruit instead of juice.  Eat more foods that contain soluble fiber. Examples of foods that contain this type of fiber are apples, broccoli, carrots, beans, peas, and barley. Aim to get 20-30 g of fiber per day.  Eat more home-cooked food and less restaurant, buffet, and fast food.  Limit or avoid alcohol.  Limit foods high in starch and sugar.  Limit fried foods.  Cook foods using methods other than frying. Baking, boiling, grilling, and broiling are all great options.  Lose weight if you are overweight. Losing just 5-10% of your initial body weight can help your overall health and prevent diseases such  as diabetes and heart disease. WHAT FOODS CAN I EAT? Grains Whole grains, such as whole wheat or whole grain breads, crackers, cereals, and pasta. Unsweetened oatmeal, bulgur, barley, quinoa, or brown rice. Corn or whole wheat flour tortillas. Vegetables Fresh or frozen vegetables (raw, steamed, roasted, or grilled). Green salads. Fruits All fresh, canned (in natural juice), or frozen  fruits. Meat and Other Protein Products Ground beef (85% or leaner), grass-fed beef, or beef trimmed of fat. Skinless chicken or [place]. Ground chicken or [place]. Pork trimmed of fat. All fish and seafood. Eggs. Dried beans, peas, or lentils. Unsalted nuts or seeds. Unsalted canned or dry beans. Dairy Low-fat dairy products, such as skim or 1% milk, 2% or reduced-fat cheeses, low-fat ricotta or cottage cheese, or plain low-fat yogurt. Fats and Oils Tub margarines without trans fats. Light or reduced-fat mayonnaise and salad dressings. Avocado. Olive, canola, sesame, or safflower oils. Natural peanut or almond butter (choose ones without added sugar and oil). The items listed above may not be a complete list of recommended foods or beverages. Contact your dietitian for more options. WHAT FOODS ARE NOT RECOMMENDED? Grains White bread. White pasta. White rice. Cornbread. Bagels, pastries, and croissants. Crackers that contain trans fat. Vegetables White potatoes. Corn. Creamed or fried vegetables. Vegetables in a cheese sauce. Fruits Dried fruits. Canned fruit in light or heavy syrup. Fruit juice. Meat and Other Protein Products Fatty cuts of meat. Ribs, chicken wings, bacon, sausage, bologna, salami, chitterlings, fatback, hot dogs, bratwurst, and packaged luncheon meats. Liver and organ meats. Dairy Whole or 2% milk, cream, half-and-half, and cream cheese. Whole milk cheeses. Whole-fat or sweetened yogurt. Full-fat cheeses. Nondairy creamers and whipped toppings. Processed cheese, cheese spreads, or cheese curds. Sweets and Desserts Corn syrup, sugars, honey, and molasses. Candy. Jam and jelly. Syrup. Sweetened cereals. Cookies, pies, cakes, donuts, muffins, and ice cream. Fats and Oils Butter, stick margarine, lard, shortening, ghee, or bacon fat. Coconut, palm kernel, or palm oils. Beverages Alcohol. Sweetened drinks (such as sodas, lemonade, and fruit drinks or punches). The items listed  above may not be a complete list of foods and beverages to avoid. Contact your dietitian for more information.   This information is not intended to replace advice given to you by your health care provider. Make sure you discuss any questions you have with your health care provider.   Document Released: 07/20/2005 Document Revised: 08/10/2014 Document Reviewed: 10/18/2013 Elsevier Interactive Patient Education [employer]

## 2015-09-02 NOTE — Progress Notes (Signed)
Pre visit review using our clinic review tool, if applicable. No additional management support is needed unless otherwise documented below in the visit note. 

## 2015-09-02 NOTE — Progress Notes (Signed)
Subjective:    Patient ID: Cheryl [name], female    DOB: 1961-01-07, 55 y.o.   MRN: [id number]  HPI Admit date: 08/20/2015 Discharge date: 08/23/2015  Discharge Diagnoses:   . Left-sided weakness . TIA (transient ischemic attack) Hypertension Carotid fibromuscular dysplasia Chronic thoracic back pain  Consults: Neurology Neuro interventional radiology, Dr. Corliss Skains  Recommendations for Outpatient Follow-up:  1. Please repeat CBC/BMET at next visit 2. Neurology recommended aspirin 325 mg daily, statin   DIET: Heart healthy diet  55 year old patient seen following a recent hospital discharge.  She has a history of known fibromuscular dysplasia.  Since her hospital discharge.  She has remained stable without any new neurological symptoms.  She has had no recurrence of her headaches.  Numbness about the left eye or vertigo.  She is scheduled for neurology follow-up in about 1 month LDL cholesterol was noted to be 205 and now she is on high intensity statin therapy, which he tolerates well. She was noted have hypertension and now is on amlodipine  Past Medical History  Diagnosis Date  . Bursitis of left hip   . Personal history of colonic polyps     Adenomatous   . GERD (gastroesophageal reflux disease)   . Dysphagia   . Iron deficiency anemia   . Hyperlipemia   . Esophagitis   . Hemorrhoids   . Arthritis   . Hypertension   . Fibromuscular dysplasia [facility])     Social History   Social History  . Marital Status: Married    Spouse Name: N/A  . Number of Children: N/A  . Years of Education: N/A   Occupational History  . RN    Social History Main Topics  . Smoking status: Never Smoker   . Smokeless tobacco: Never Used  . Alcohol Use: No  . Drug Use: No  . Sexual Activity: Not on file   Other Topics Concern  . Not on file   Social History Narrative   No regular exercise    Past Surgical History  Procedure Laterality Date  . Cesarean section      x 2    . Nasoseptoplasty    . Temporomandibular joint surgery  1989    Family History  Problem Relation Age of Onset  . Colon cancer Neg Hx   . Prostate cancer Father   . Cancer Father     Laryngeal  . Diabetes Maternal Aunt   . Hypertension Mother   . Hyperlipidemia Mother   . Other Mother     varicose veins    Allergies  Allergen Reactions  . Cefazolin Hives and Itching  . Sulfonamide Derivatives Hives and Itching    Current Outpatient Prescriptions on File Prior to Visit  Medication Sig Dispense Refill  . aspirin 325 MG tablet Take 1 tablet (325 mg total) by mouth daily. 30 tablet 5  . calcium carbonate (TUMS - DOSED IN MG ELEMENTAL CALCIUM) 500 MG chewable tablet Chew 1 tablet by mouth 2 (two) times daily.    Marland Kitchen ibuprofen (ADVIL,MOTRIN) 200 MG tablet Take 400 mg by mouth every 6 (six) hours as needed for headache or moderate pain.    . Multiple Vitamin (MULTIVITAMIN WITH MINERALS) TABS tablet Take 1 tablet by mouth daily.     No current facility-administered medications on file prior to visit.    BP 130/90 mmHg  Pulse 104  Temp(Src) 98.5 F (36.9 C) (Oral)  Resp 18  Ht 5' (1.524 m)  Wt 97 lb (43.999 kg)  BMI 18.94 kg/m2  SpO2 98%  LMP 12/22/2011    Review of Systems  HENT: Negative for congestion, dental problem, hearing loss, rhinorrhea, sinus pressure, sore throat and tinnitus.   Eyes: Positive for pain and visual disturbance. Negative for discharge.  Respiratory: Negative for cough and shortness of breath.   Cardiovascular: Negative for chest pain, palpitations and leg swelling.  Gastrointestinal: Negative for nausea, vomiting, abdominal pain, diarrhea, constipation, blood in stool and abdominal distention.  Genitourinary: Negative for dysuria, urgency, frequency, hematuria, flank pain, vaginal bleeding, vaginal discharge, difficulty urinating, vaginal pain and pelvic pain.  Musculoskeletal: Negative for joint swelling, arthralgias and gait problem.  Skin:  Negative for rash.  Neurological: Positive for dizziness, weakness, light-headedness, numbness and headaches. Negative for syncope and speech difficulty.  Hematological: Negative for adenopathy.  Psychiatric/Behavioral: Negative for behavioral problems, dysphoric mood and agitation. The patient is not nervous/anxious.        Objective:   Physical Exam  Constitutional: She is oriented to person, place, and time. She appears well-developed and well-nourished.  Blood pressure 124/80  HENT:  Head: Normocephalic.  Right Ear: External ear normal.  Left Ear: External ear normal.  Mouth/Throat: Oropharynx is clear and moist.  Eyes: Conjunctivae and EOM are normal. Pupils are equal, round, and reactive to light.  Neck: Normal range of motion. Neck supple. No thyromegaly present.  Cardiovascular: Normal rate, regular rhythm, normal heart sounds and intact distal pulses.   Pulmonary/Chest: Effort normal and breath sounds normal.  Abdominal: Soft. Bowel sounds are normal. She exhibits no mass. There is no tenderness.  Musculoskeletal: Normal range of motion.  Lymphadenopathy:    She has no cervical adenopathy.  Neurological: She is alert and oriented to person, place, and time. No cranial nerve deficit. Coordination normal.  Skin: Skin is warm and dry. No rash noted.  Psychiatric: She has a normal mood and affect. Her behavior is normal.          Assessment & Plan:   Status post TIA.  Follow-up neurology   Carotid artery stenosis secondary to FMD Essential hypertension Dyslipidemia.  Continue atorvastatin.  Recheck lipid profile.  3 months.  Heart healthy diet.  Encouraged

## 2015-09-30 MED FILL — AMLODIPINE BESYLATE 2.5 MG: 2.5 | 90 days supply | Qty: 180 | Fill #0

## 2015-09-30 MED FILL — ATORVASTATIN 40 MG TABLET: 40 | 90 days supply | Qty: 90 | Fill #0

## 2015-11-26 ENCOUNTER — Encounter: Payer: Self-pay | Admitting: Neurology

## 2015-11-26 ENCOUNTER — Ambulatory Visit (INDEPENDENT_AMBULATORY_CARE_PROVIDER_SITE_OTHER): Payer: 59 | Admitting: Neurology

## 2015-11-26 VITALS — BP 126/79 | HR 85 | Ht 60.0 in | Wt 97.8 lb

## 2015-11-26 DIAGNOSIS — I773 Arterial fibromuscular dysplasia: Secondary | ICD-10-CM | POA: Diagnosis not present

## 2015-11-26 NOTE — Patient Instructions (Signed)
I had a long d/w patient about her recent TIA,fibromuscular dysplasia risk for recurrent stroke/TIAs, personally independently reviewed imaging studies and stroke evaluation results and answered questions.Continue aspirin 325 mg daily  for secondary stroke prevention and maintain strict control of hypertension with blood pressure goal below 130/90,  and lipids with LDL cholesterol goal below 70 mg/dL. I also advised the patient to eat a healthy diet with plenty of whole grains, cereals, fruits and vegetables, exercise regularly and maintain ideal body weight .I advised her to avoid intense isometric exercises which may cause pressure on the neck. Followup in the future with me in future in 6 months or call earlier if necessary

## 2015-11-26 NOTE — Progress Notes (Signed)
[facility] [address] [place]. [place] [address] [phone]       OFFICE FOLLOW-UP NOTE  Ms. Estrella Myrtle Blouch Date of Birth:  09/20/1960 Medical Record Number:  [id number]   HPI: Ms Fleet is a 55 year lady seen today for first office f/u visit after hospital admission for TIA in January 2017.NAIDELIN GUGLIOTTA is a 55 y.o. 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 She also had persistent blurred vision as well as throbbing behind her left eye, she presented to [facility] ED, Ct head no acute findings. Patient was admitted to [facility] for further workup for TIA/CVA. CT scan of her head showed no acute abnormalities MRI scan of the brain showed no acute infarct. MRA of the brain showed normal intracranial circulation except mild diffuse of fibromuscular dysplastic changes. She has a known history of fibromuscular dysplasia since 2006. Transthoracic echo showed normal ejection fraction. Cerebral catheter angiogram was performed by Dr. Corliss Skains which showed moderate to severe nonocclusive changes of fibromuscular dysplasia involving both carotid and vertebral arteries extracranially. No evidence of aneurysm or high-grade stenosis is noted. Patient also had abdominal angiogram of the renal arteries which showed no evidence of renal artery stenosis. Hemoglobin A1c was 5.7. Total cholesterol elevated at 284, triglycerides 56, HDL 68 and LDL 205 mg percent. She was started on Lipitor 40 mg of aspirin to 25 mg. Patient states his done well since discharge. She is tolerating aspirin without bleeding or bruising and Lipitor without myalgias or arthralgias. She has been seen by ophthalmologist Dr. Lorin Picket who did a detailed eye exam and did not find any abnormalities. 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 She states her blood pressure is well controlled and today it is 126/79 and she is taking Norvasc 5 mg daily   ROS:   14 system review of systems is positive for insomnia, allergies, itching, acid reflux and all other systems negative  PMH:  Past Medical History  Diagnosis Date  . Bursitis of left hip   . Personal history of colonic polyps     Adenomatous   . GERD (gastroesophageal reflux disease)   . Dysphagia   . Iron deficiency anemia   . Hyperlipemia   . Esophagitis   . Hemorrhoids   . Arthritis   . Hypertension   . Fibromuscular dysplasia (HCC)   . Stroke [facility])     Social History:  Social History   Social History  . Marital Status: Married    Spouse Name: N/A  . Number of Children: N/A  . Years of Education: N/A   Occupational History  . RN    Social History Main Topics  . Smoking status: Never Smoker   . Smokeless tobacco: Never Used  . Alcohol Use: No  . Drug Use: No  . Sexual Activity: Not on file   Other Topics Concern  . Not on file   Social History Narrative   No regular exercise    Medications:   Current Outpatient Prescriptions on File Prior to Visit  Medication Sig Dispense Refill  . amLODipine (NORVASC)  2.5 MG tablet Take 2 tablets (5 mg total) by mouth daily. 180 tablet 3  . aspirin 325 MG tablet Take 1 tablet (325 mg total) by mouth daily. 30 tablet 5  . atorvastatin (LIPITOR) 40 MG tablet Take 1 tablet (40 mg total) by mouth at bedtime. 90 tablet 3  . calcium carbonate (TUMS - DOSED IN MG ELEMENTAL CALCIUM) 500 MG chewable tablet Chew 1 tablet by mouth 2 (two) times daily. tAKES 400 MG TWICE A DAILY    . cetirizine (ZYRTEC) 10 MG tablet Take 1 tablet (10 mg total) by mouth as needed for allergies. 90 tablet 3  . ibuprofen (ADVIL,MOTRIN) 200 MG tablet Take 400 mg by  mouth every 6 (six) hours as needed for headache or moderate pain.    . Multiple Vitamin (MULTIVITAMIN WITH MINERALS) TABS tablet Take 1 tablet by mouth daily.    . sodium chloride (OCEAN) 0.65 % SOLN nasal spray Place 2 sprays into both nostrils as needed for congestion. 1 Bottle 5   No current facility-administered medications on file prior to visit.    Allergies:   Allergies  Allergen Reactions  . Cefazolin Hives and Itching  . Sulfonamide Derivatives Hives and Itching    Physical Exam General: frail middle aged lady seated, in no evident distress Head: head normocephalic and atraumatic.  Neck: supple with no carotid or supraclavicular bruits Cardiovascular: regular rate and rhythm, no murmurs Musculoskeletal: no deformity Skin:  no rash/petichiae Vascular:  Normal pulses all extremities Filed Vitals:   11/26/15 0846  BP: 126/79  Pulse: 85   Neurologic Exam Mental Status: Awake and fully alert. Oriented to place and time. Recent and remote memory intact. Attention span, concentration and fund of knowledge appropriate. Mood and affect appropriate.  Cranial Nerves: Fundoscopic exam reveals sharp disc margins. Pupils equal, briskly reactive to light. Extraocular movements full without nystagmus. Visual fields full to confrontation. Hearing intact. Facial sensation intact. Face, tongue, palate moves normally and symmetrically.  Motor: Normal bulk and tone. Normal strength in all tested extremity muscles. Sensory.: intact to touch ,pinprick .position and vibratory sensation.  Coordination: Rapid alternating movements normal in all extremities. Finger-to-nose and heel-to-shin performed accurately bilaterally. Gait and Station: Arises from chair without difficulty. Stance is normal. Gait demonstrates normal stride length and balance . Able to heel, toe and tandem walk without difficulty.  Reflexes: 1+ and symmetric. Toes downgoing.   NIHSS  0 Modified Rankin  0   ASSESSMENT: 4  year ladyLady with an episode of left eye pain and facial numbness possible TIA in January 2017 with history of fibromuscular dysplasia, hypertension and hyperlipidemia.    PLAN: I had a long d/w patient about her recent TIA,fibromuscular dysplasia risk for recurrent stroke/TIAs, personally independently reviewed imaging studies and stroke evaluation results and answered questions.Continue aspirin 325 mg daily  for secondary stroke prevention and maintain strict control of hypertension with blood pressure goal below 130/90,  and lipids with LDL cholesterol goal below 70 mg/dL. I also advised the patient to eat a healthy diet with plenty of whole grains, cereals, fruits and vegetables, exercise regularly and maintain ideal body weight .I advised her to avoid intense isometric exercises which may cause pressure on the neck.Greater than 50% of time during this 25 minute visit was spent on counseling,explanation of diagnosis, planning of further management, discussion with patient  and coordination of care for about her TIA and fibromuscular dysplasia. Followup in the future with me in future in 6 months or call earlier if  necessary  Delia HeadyPramod Sethi, MD  [facility] [address] [place], [place] [id number]  Phone [phone] Fax [phone]    Note: This document was prepared with digital dictation and possible smart phrase technology. Any transcriptional errors that result from this process are unintentional

## 2015-11-29 ENCOUNTER — Telehealth: Payer: Self-pay | Admitting: Neurology

## 2015-11-29 NOTE — Telephone Encounter (Addendum)
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 Pt said to not put the name of the job on this letter please. Please notify her when letter is ready, she will pick it up.

## 2015-12-02 NOTE — Telephone Encounter (Signed)
Pt returned Katrina's call. She will send request for letter to be sent to her thru mychart and will also like to pick up copy on Monday 12/09/15.

## 2015-12-02 NOTE — Telephone Encounter (Signed)
LFT vm that letter can be done. Also left vm that Dr.Sethi is in the hospital seeing stroke patients. Rn stated letter can be ready for May 8, when Dr.Sethi returns to the office. Also if she needs the letter this week to call back.

## 2015-12-03 NOTE — Telephone Encounter (Signed)
Letter written, for patient. Dr. Pearlean BrownieSethi will review letter and sign on 12/09/2015 which is Monday.

## 2015-12-09 NOTE — Telephone Encounter (Signed)
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 Pt will pick up today or tomorrow. [facility] front desk.

## 2015-12-09 NOTE — Telephone Encounter (Signed)
Patient called today as she has been out of town and wanted to know if her letter is ready and to check to see what has been done with it.  She will go into My Chart of she needs to. Thanks!

## 2016-03-03 MED FILL — AMLODIPINE BESYLATE 2.5 MG: 2.5 | 90 days supply | Qty: 180 | Fill #1

## 2016-04-16 ENCOUNTER — Encounter: Payer: Self-pay | Admitting: Surgery

## 2016-04-16 ENCOUNTER — Telehealth: Payer: Self-pay | Admitting: Neurology

## 2016-04-16 NOTE — Telephone Encounter (Signed)
Pt called in stating she is having muscle spasms in her lower lip. It started out only in the evening  infrequently but now it is during throughout the day and more frequent but sporadic. She says it feels different but she does not have any numbness.  Please call and advise [phone]

## 2016-04-16 NOTE — Telephone Encounter (Signed)
Pt called back to check on status of last note. I advised that note has been sent to provider but he has not been able to respond. she also wanted to report that last night she had stabbing pain in the rt eye. She said her vision was ok. Sts today it feels like grit is in the eye.

## 2016-04-17 ENCOUNTER — Other Ambulatory Visit: Payer: Self-pay | Admitting: *Deleted

## 2016-04-17 DIAGNOSIS — I773 Arterial fibromuscular dysplasia: Secondary | ICD-10-CM

## 2016-04-17 NOTE — Telephone Encounter (Signed)
I returned the patient call and left message on answering machine asking her to call me back.

## 2016-04-20 ENCOUNTER — Ambulatory Visit (HOSPITAL_COMMUNITY)
Admission: RE | Admit: 2016-04-20 | Discharge: 2016-04-20 | Disposition: A | Discharge status: Home / Self Care | Payer: 59 | Source: Ambulatory Visit | Attending: Surgery | Admitting: Surgery

## 2016-04-20 ENCOUNTER — Encounter: Payer: Self-pay | Admitting: Surgery

## 2016-04-20 ENCOUNTER — Ambulatory Visit (INDEPENDENT_AMBULATORY_CARE_PROVIDER_SITE_OTHER): Payer: 59 | Admitting: Surgery

## 2016-04-20 VITALS — BP 128/69 | HR 93 | Ht 60.0 in | Wt 95.4 lb

## 2016-04-20 DIAGNOSIS — I773 Arterial fibromuscular dysplasia: Secondary | ICD-10-CM

## 2016-04-20 DIAGNOSIS — I6523 Occlusion and stenosis of bilateral carotid arteries: Secondary | ICD-10-CM | POA: Insufficient documentation

## 2016-04-20 LAB — VAS US CAROTID
LCCADSYS: 96 cm/s
LCCAPDIAS: 28 cm/s
LEFT ECA DIAS: -17 cm/s
LEFT VERTEBRAL DIAS: -19 cm/s
LICADSYS: -161 cm/s
Left CCA dist dias: 28 cm/s
Left CCA prox sys: 123 cm/s
Left ICA dist dias: -68 cm/s
Left ICA prox dias: -26 cm/s
Left ICA prox sys: -69 cm/s
RCCADSYS: -152 cm/s
RCCAPDIAS: 25 cm/s
RCCAPSYS: 103 cm/s
RIGHT CCA MID DIAS: 33 cm/s
RIGHT ECA DIAS: -24 cm/s
RIGHT VERTEBRAL DIAS: 14 cm/s

## 2016-04-20 NOTE — Progress Notes (Signed)
[facility] [place]  Patient name: Cheryl [name] MRN: [id number] DOB: May 25, 1961 Sex: female  REASON FOR VISIT: Follow-up  HPI: Cheryl [name] is a 54 y.o. female who presents for continued follow-up of her carotid fibromuscular dysplasia. Patient was last seen in the office 2 years ago. At that time, she had bilateral 40-59% carotid stenosis. Since that time, she was hospitalized for a TIA back in January 2017. Her symptoms included transient left sided facial numbness and left tongue deviation. She also had episodes of left eye blurriness as well as throbbing behind her left eye. During her hospitalization, interventional radiology performed a cerebral and renal angiogram. Her angiogram revealed moderate to severe nonocclusive fibromuscular dysplasia changes in the carotids and vertebral are arteries extracranially. There was no evidence of fibromuscular dysplasia of the renal arteries. She was discharged home on an aspirin and statin.  Today, she reports a 2 to three-month history of intermittent lower lip tremors. She also had an episode of numbness of her lower lip last week. She denies any amaurosis fugax, sudden onset weakness or numbness of her extremities expressive or receptive aphasia. She continues to complain of thoracic back pain associated with intense itching. Her MRI during her last hospitalization showed mild disc degeneration.  The patient takes a 325 mg aspirin and atorvastatin twice a week. She is not taking it every day because of muscle cramps. The patient reports continued stress associated with her job and family.  Past Medical History:  Diagnosis Date  . Arthritis   . Bursitis of left hip   . Dysphagia   . Esophagitis   . Fibromuscular dysplasia (HCC)   . GERD (gastroesophageal reflux disease)   . Hemorrhoids   . Hyperlipemia   . Hypertension   . Iron deficiency anemia   . Personal history of colonic polyps    Adenomatous   . Stroke  (HCC)   . TIA (transient ischemic attack)     Family History  Problem Relation Age of Onset  . Prostate cancer Father   . Cancer Father     Laryngeal  . Hypertension Mother   . Hyperlipidemia Mother   . Other Mother     varicose veins  . Stroke Mother   . Stroke Maternal Grandmother   . Diabetes Maternal Aunt   . Colon cancer Neg Hx     SOCIAL HISTORY: Social History  Substance Use Topics  . Smoking status: Never Smoker  . Smokeless tobacco: Never Used  . Alcohol use No    Allergies  Allergen Reactions  . Cefazolin Hives and Itching  . Sulfonamide Derivatives Hives and Itching    Current Outpatient Prescriptions  Medication Sig Dispense Refill  . amLODipine (NORVASC) 2.5 MG tablet Take 2 tablets (5 mg total) by mouth daily. 180 tablet 3  . aspirin 325 MG tablet Take 1 tablet (325 mg total) by mouth daily. 30 tablet 5  . atorvastatin (LIPITOR) 40 MG tablet Take 1 tablet (40 mg total) by mouth at bedtime. 90 tablet 3  . calcium carbonate (TUMS - DOSED IN MG ELEMENTAL CALCIUM) 500 MG chewable tablet Chew 1 tablet by mouth 2 (two) times daily. tAKES 400 MG TWICE A DAILY    . cetirizine (ZYRTEC) 10 MG tablet Take 1 tablet (10 mg total) by mouth as needed for allergies. 90 tablet 3  . Multiple Vitamin (MULTIVITAMIN WITH MINERALS) TABS tablet Take 1 tablet by mouth daily.    Marland Kitchen omeprazole (PRILOSEC OTC) 20 MG  tablet Take 20 mg by mouth as needed.    . sodium chloride (OCEAN) 0.65 % SOLN nasal spray Place 2 sprays into both nostrils as needed for congestion. 1 Bottle 5  . ibuprofen (ADVIL,MOTRIN) 200 MG tablet Take 400 mg by mouth every 6 (six) hours as needed for headache or moderate pain.     No current facility-administered medications for this visit.     REVIEW OF SYSTEMS:  [X]  denotes positive finding, [ ]  denotes negative finding Cardiac  Comments:  Chest pain or chest pressure:    Shortness of breath upon exertion:    Short of breath when lying flat:    Irregular  heart rhythm:        Vascular    Pain in calf, thigh, or hip brought on by ambulation:    Pain in feet at night that wakes you up from your sleep:     Blood clot in your veins:    Leg swelling:         Pulmonary    Oxygen at home:    Productive cough:     Wheezing:         Neurologic    Sudden weakness in arms or legs:     Sudden numbness in arms or legs:     Sudden onset of difficulty speaking or slurred speech:    Temporary loss of vision in one eye:     Problems with dizziness:         Gastrointestinal    Blood in stool:     Vomited blood:         Genitourinary    Burning when urinating:     Blood in urine:        Psychiatric    Major depression:         Hematologic    Bleeding problems:    Problems with blood clotting too easily:        Skin    Rashes or ulcers:        Constitutional    Fever or chills:      PHYSICAL EXAM: Vitals:   04/20/16 1352 04/20/16 1353  BP: 106/69 128/69  Pulse: 93   SpO2: 100%   Weight: 95 lb 6.4 oz (43.3 kg)   Height: 5' (1.524 m)     GENERAL: The patient is a well-nourished female, in no acute distress. The vital signs are documented above. CARDIAC: There is a regular rate and rhythm. No carotid bruits. VASCULAR: 2+ radial pulses symmetric. PULMONARY: There is good air exchange bilaterally without wheezing or rales. MUSCULOSKELETAL: There are no major deformities or cyanosis. NEUROLOGIC: No focal deficits. 5 out of 5 strength upper and lower extremities bilaterally. SKIN: There are no ulcers or rashes noted. PSYCHIATRIC: The patient has a normal affect.  DATA:  Carotid duplex 04/20/2016  Bilateral internal carotid arteries with elevated velocities suggestive of 60-79%.  MEDICAL ISSUES: Bilateral carotid fibromuscular dysplasia  There has been an interval increase in the velocities found on her carotid duplex. The patient did have a TIA back in January 2017. Carotid angiogram at that time revealed moderate to severe  nonocclusive fibromuscular changes in the carotid and vertebral arteries extracranially. She has not had any TIA or stroke symptoms since that time. Discussed that her lip tremors may be related to stress. Would recommend follow-up in 1 year with repeat carotid duplex. She will continue a daily aspirin and statin. Advised the patient to follow-up with her PCP regarding statin  side effects.   Maris BergerKimberly Christorpher Hisaw, PA-C [facility] [place]   I agree with the above.  The patient is well known to me, as I have followed her for bilateral carotid fibromuscular disease.  In January 2017 she developed symptoms consistent with cerebral ischemia.  She underwent angiography by Dr. Corliss Skainseveshwar which revealed the fibromuscular changes.  She was also seen and evaluated by neurology.  Medical management was recommended.  Her renal arteries were also studied and did not show evidence of fibromuscular disease.  Her symptoms resolved.  Her only complaint today is that of lip twitching which has been going on for approximately 1-2 months.  I reviewed her ultrasound from today which shows 60-79 percent stenosis in bilateral carotid arteries.  I still favor medical management at this time.  I think a large component of her symptoms is distress she experiences from work.  We have discussed minimizing her stress load to hopefully help alleviate some of her anxiety and symptoms.  I would not recommend surgical intervention currently.  She will contact me if she develops any new symptoms.  Otherwise I have her scheduled for follow-up in 1 year.  Durene CalWells Brabham

## 2016-05-29 ENCOUNTER — Ambulatory Visit (INDEPENDENT_AMBULATORY_CARE_PROVIDER_SITE_OTHER): Payer: 59 | Admitting: Neurology

## 2016-05-29 ENCOUNTER — Encounter: Payer: Self-pay | Admitting: Neurology

## 2016-05-29 VITALS — BP 118/70 | HR 94 | Ht 60.0 in | Wt 94.0 lb

## 2016-05-29 DIAGNOSIS — I773 Arterial fibromuscular dysplasia: Secondary | ICD-10-CM | POA: Diagnosis not present

## 2016-05-29 NOTE — Progress Notes (Signed)
[facility] [address] [place]. [place] [id number] [phone]       OFFICE FOLLOW-UP NOTE  Cheryl. Cheryl [name] Date of Birth:  03/01/1961 Medical Record Number:  [id number]   HPI:  Visit 11/26/2015 :Cheryl [name] is a [age] year lady seen today for first office f/u visit after hospital admission for TIA in January 2017.Cheryl [name] is a 55 y.o. 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 She also had persistent blurred vision as well as throbbing behind her left eye, [facility] Ct head no acute findings. [facility] further workup for TIA/CVA. CT scan of her head showed no acute abnormalities MRI scan of the brain showed no acute infarct. MRA of the brain showed normal intracranial circulation except mild diffuse of fibromuscular dysplastic changes. She has a known history of fibromuscular dysplasia since 2006. Transthoracic echo showed normal ejection fraction. Cerebral catheter angiogram was performed by Dr. Corliss Skainseveshwar which showed moderate to severe nonocclusive changes of fibromuscular dysplasia involving both carotid and vertebral arteries extracranially. No evidence of aneurysm or high-grade stenosis is noted. Patient also had abdominal angiogram of the renal arteries which showed no evidence of renal artery stenosis. Hemoglobin A1c was 5.7. Total cholesterol elevated at 284, triglycerides 56, HDL 68 and LDL 205 mg percent. She was started on Lipitor 40 mg of aspirin to 25 mg. Patient states his done well since discharge. She is tolerating aspirin without bleeding or bruising and Lipitor without myalgias or arthralgias. She has been seen by ophthalmologist Dr. Lorin PicketScott who did a detailed eye exam and did not find any abnormalities. 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 She states her blood pressure is well controlled and today it is 126/79 and she is taking Norvasc 5 mg daily  Update 05/29/2016 : She returns for follow-up after last visit 6 months ago. She is doing well and has not had any recurrent stroke or TIA symptoms. She had a couple of days of transient leg tremors of the left jaw last month. She felt this may be related to anxiety or stress. She was interviewing for a job but did not get the job. She has been participating in some activities for relaxation like she listens to meditation tapes and has started going to the [facility] [employer]. She was seen by Dr. Myra GianottiBrabham vascular surgeon on 9/18/17and had follow-up carotid ultrasound in his office which showed bilateral 60-79%  Carotid stenosis. Conservative medical therapy has been recommended. She remains on aspirin which is tolerating well without bruising or bleeding. Her blood pressure is well controlled on Norvasc and today it is 118/70. She is tolerating Lipitor well without muscle aches or pains. She has no new complaints today. ROS:   14 system review of systems is positive for (weight change, shortness of breath, dizziness, back pain, frequent waking, lip tremors and all other systems negative  PMH:  Past Medical History:  Diagnosis Date  . Arthritis   . Bursitis of left hip   . Dysphagia   . Esophagitis   . Fibromuscular dysplasia (  HCC)   . GERD (gastroesophageal reflux disease)   . Hemorrhoids   . Hyperlipemia   . Hypertension   . Iron deficiency anemia   . Personal history of colonic polyps    Adenomatous   . Stroke (HCC)   . TIA (transient ischemic attack)     Social History:  Social History   Social History  . Marital status: Married    Spouse name: N/A  . Number of children: N/A  . Years of  education: N/A   Occupational History  . RN    Social History Main Topics  . Smoking status: Never Smoker  . Smokeless tobacco: Never Used  . Alcohol use No  . Drug use: No  . Sexual activity: Not on file   Other Topics Concern  . Not on file   Social History Narrative   No regular exercise    Medications:   Current Outpatient Prescriptions on File Prior to Visit  Medication Sig Dispense Refill  . amLODipine (NORVASC) 2.5 MG tablet Take 2 tablets (5 mg total) by mouth daily. 180 tablet 3  . aspirin 325 MG tablet Take 1 tablet (325 mg total) by mouth daily. 30 tablet 5  . atorvastatin (LIPITOR) 40 MG tablet Take 1 tablet (40 mg total) by mouth at bedtime. 90 tablet 3  . calcium carbonate (TUMS - DOSED IN MG ELEMENTAL CALCIUM) 500 MG chewable tablet Chew 1 tablet by mouth 2 (two) times daily. tAKES 400 MG TWICE A DAILY    . cetirizine (ZYRTEC) 10 MG tablet Take 1 tablet (10 mg total) by mouth as needed for allergies. 90 tablet 3  . ibuprofen (ADVIL,MOTRIN) 200 MG tablet Take 400 mg by mouth every 6 (six) hours as needed for headache or moderate pain.    . Multiple Vitamin (MULTIVITAMIN WITH MINERALS) TABS tablet Take 1 tablet by mouth daily.    Marland Kitchen omeprazole (PRILOSEC OTC) 20 MG tablet Take 20 mg by mouth as needed.    . sodium chloride (OCEAN) 0.65 % SOLN nasal spray Place 2 sprays into both nostrils as needed for congestion. 1 Bottle 5   No current facility-administered medications on file prior to visit.     Allergies:   Allergies  Allergen Reactions  . Cefazolin Hives and Itching  . Sulfonamide Derivatives Hives and Itching    Physical Exam General: frail middle aged african american ady seated, in no evident distress Head: head normocephalic and atraumatic.  Neck: supple with no carotid or supraclavicular bruits Cardiovascular: regular rate and rhythm, no murmurs Musculoskeletal: no deformity Skin:  no rash/petichiae Vascular:  Normal pulses all extremities Vitals:    05/29/16 0829  BP: 118/70  Pulse: 94   Neurologic Exam Mental Status: Awake and fully alert. Oriented to place and time. Recent and remote memory intact. Attention span, concentration and fund of knowledge appropriate. Mood and affect appropriate.  Cranial Nerves: Fundoscopic exam not done. Pupils equal, briskly reactive to light. Extraocular movements full without nystagmus. Visual fields full to confrontation. Hearing intact. Facial sensation intact. Face, tongue, palate moves normally and symmetrically.  Motor: Normal bulk and tone. Normal strength in all tested extremity muscles. Sensory.: intact to touch ,pinprick .position and vibratory sensation.  Coordination: Rapid alternating movements normal in all extremities. Finger-to-nose and heel-to-shin performed accurately bilaterally. Gait and Station: Arises from chair without difficulty. Stance is normal. Gait demonstrates normal stride length and balance . Able to heel, toe and tandem walk without difficulty.  Reflexes: 1+ and symmetric. Toes downgoing.  ASSESSMENT: 81 year ladyLady with an episode of left eye pain and facial numbness possible TIA in January 2017 with history of fibromuscular dysplasia, hypertension and hyperlipidemia.    PLAN: I had a long d/w patient about her remote TIA,fibromuscular dysplasia risk for recurrent stroke/TIAs, personally independently reviewed imaging studies and stroke evaluation results and answered questions.Continue aspirin 325 mg daily  for secondary stroke prevention and maintain strict control of hypertension with blood pressure goal below 130/90,  and lipids with LDL cholesterol goal below 70 mg/dL.  I recommend that she increase participation in activities for stress laxation like meditation and yoga and regular exercises. Her recent transient episodes of lip tremors are likely related to anxiety and stress. .I advised her to avoid intense isometric exercises which may cause pressure on the  neck.Greater than 50% of time during this 25 minute visit was spent on counseling,explanation of diagnosis, planning of further management, discussion with patient  and coordination of care for about her TIA and fibromuscular dysplasia. Followup in the future with me in future in 1 year or call earlier if necessary  Delia Heady, MD  [facility] [address] [place], [place] [address]  Phone [phone] Fax [phone]    Note: This document was prepared with digital dictation and possible smart phrase technology. Any transcriptional errors that result from this process are unintentional

## 2016-06-01 IMAGING — XA IR ANGIO INTRA EXTRACRAN SEL COM CAROTID INNOMINATE BILAT MOD SE
1 of 2 series · 12 of 24 positions shown · IV contrast (IODINE)
Comparison: none

ADDENDUM:
Moderate (conscious) sedation was employed during this procedure. A
total of Versed 1 mg and Fentanyl 25 mcg was administered
intravenously.

Moderate Sedation Time: 50 minutes. The patient's level of
consciousness and vital signs were monitored continuously by
radiology nursing throughout the procedure under my direct
supervision.
CLINICAL DATA: Left-sided visual blurring. Vertigo. Dizziness.
History of fibromuscular dysplasia.

[Series 300: dr. (person_name) · 12 of 251 slices shown]
[im 1/251]
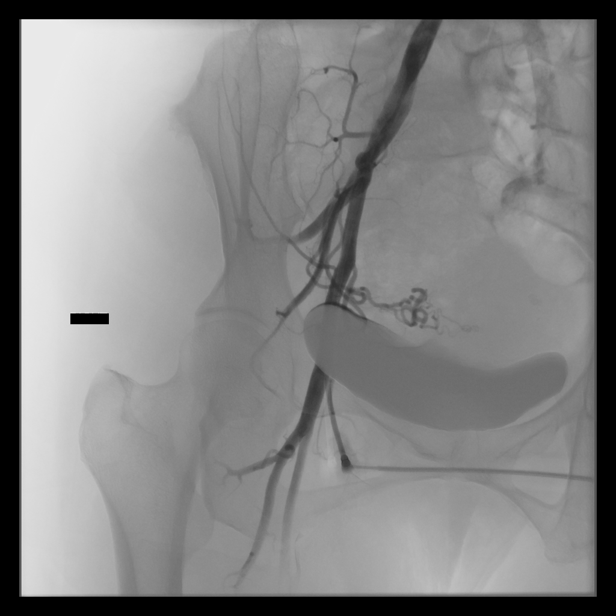
[im 23/251]
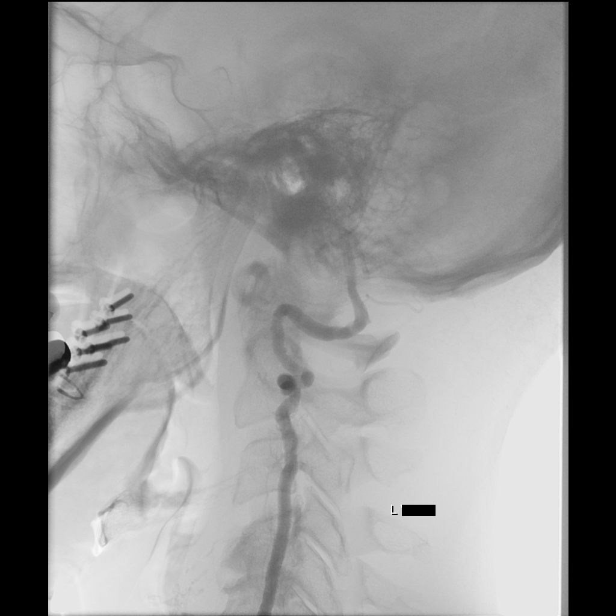
[im 46/251]
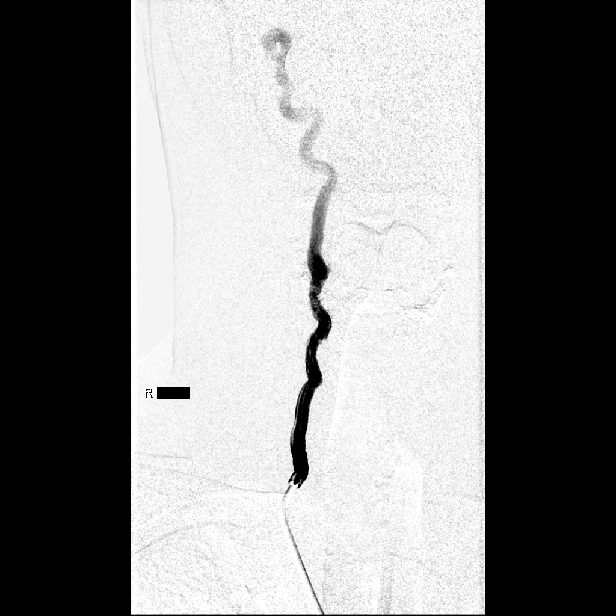
[im 69/251]
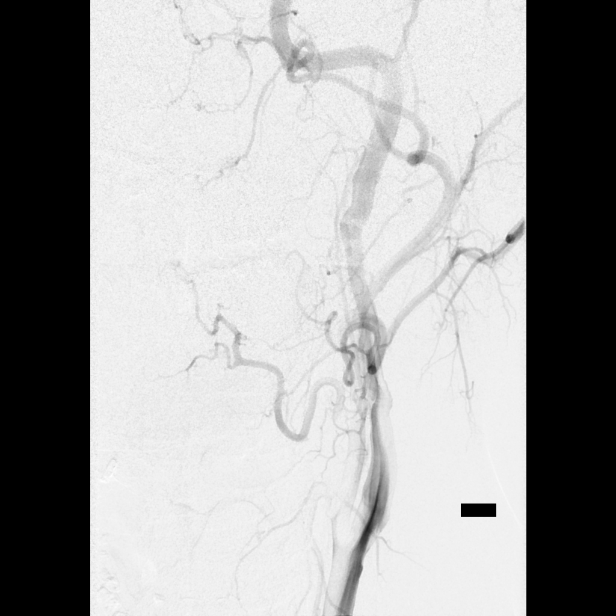
[im 91/251]
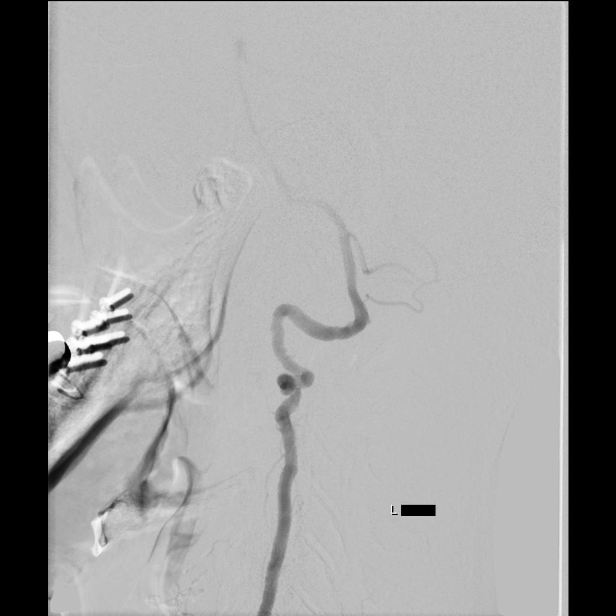
[im 114/251]
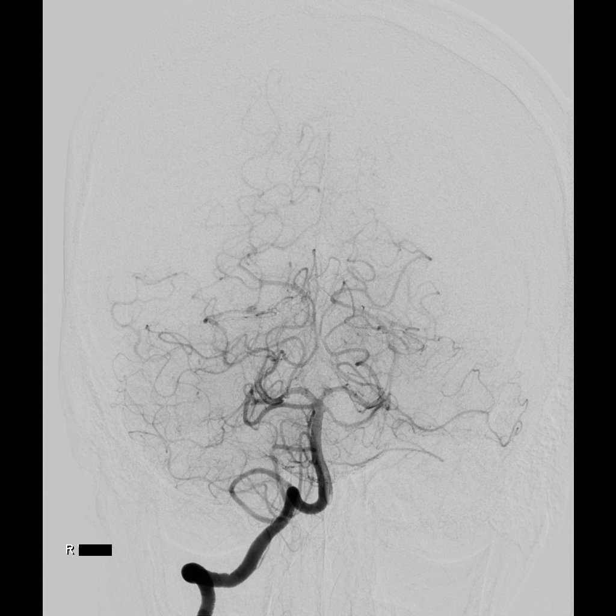
[im 137/251]
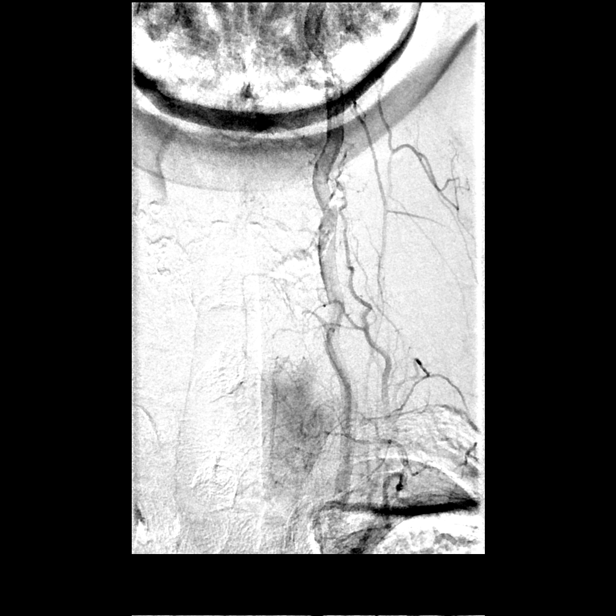
[im 160/251]
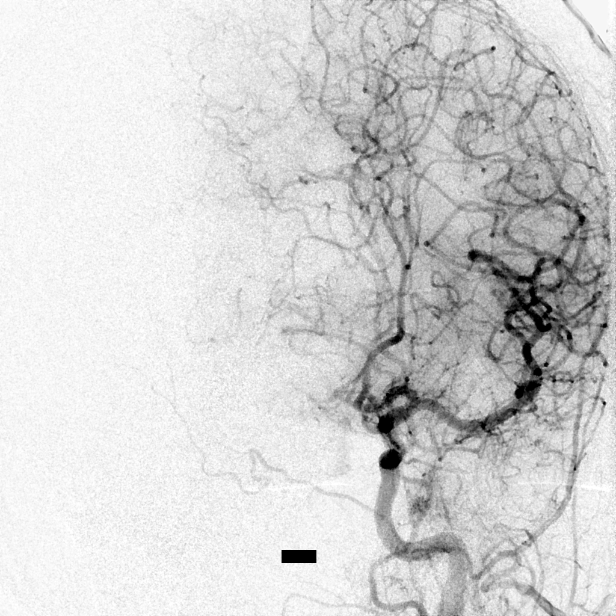
[im 182/251]
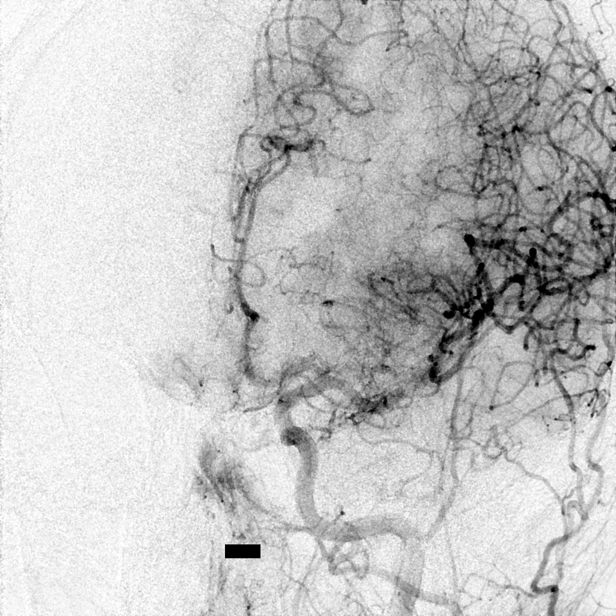
[im 205/251]
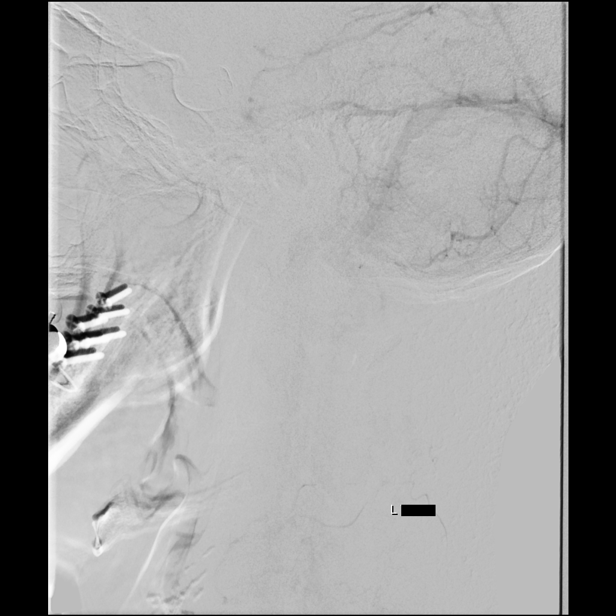
[im 228/251]
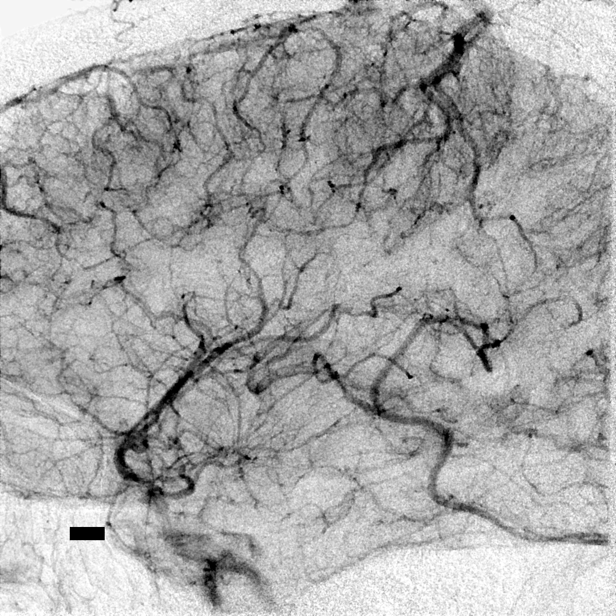
[im 251/251]
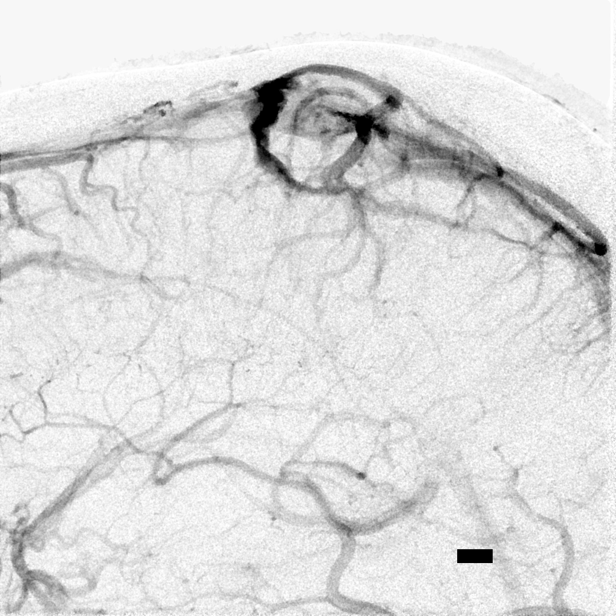

[12 of 24 positions shown; findings below may reference images not displayed]

EXAM:
BILATERAL COMMON CAROTID AND INNOMINATE ANGIOGRAPHY AND BILATERAL
VERTEBRAL ARTERY ANGIOGRAMS. ABDOMINAL AORTOGRAM :

PROCEDURE:
Contrast: Omnipaque 300 approximately 80 mL.

Anesthesia/Sedation:  Conscious sedation.

Medications: Versed 1 mg IV.  Fentanyl 25 mcg IV.

Following a full explanation of the procedure along with the
potential associated complications, an informed witnessed consent
was obtained.

The right groin was prepped and draped in the usual sterile fashion.
Thereafter using modified Seldinger technique, transfemoral access
into the right common femoral artery was obtained without
difficulty. Over a 0.035 inch guidewire, a 5 French Pinnacle sheath
was inserted. Through this, and also over 0.035 inch guidewire, a 5
French JB1 catheter was advanced to the aortic arch region and
selectively positioned in the right common carotid artery, the right
vertebral artery, the left common carotid artery and the left
vertebral artery. A 5 French pigtail catheter was then advanced in
the lower thoracic aorta at T11. An abdominal aortogram was then
performed using an injection at a rate of 15 for a total of 30 mL.

There were no acute complications. The patient tolerated the
procedure well.
FINDINGS: The right common carotid arteriogram demonstrates the right external
carotid artery and its major branches to be widely patent.

The right internal carotid artery just distal to the bulb and
extending to the level of C1 demonstrates focal areas of outpouching
interspersed with areas of normal caliber. This is most pronounced
at the level of C2-C1.

No evidence of intimal flaps is seen.

The distal right internal carotid artery at the cervical petrous
junction is normal.

The petrous, the cavernous and the supraclinoid segments are widely
patent.

The right middle and right anterior cerebral arteries opacify
normally into the capillary and the venous phases.

A right posterior communicating artery is seen opacifying the right
posterior cerebral artery distribution.

Transient opacification via the anterior communicating artery of the
left anterior cerebral artery is also noted.

The right vertebral artery origin is normal.

The vessel is seen to opacify to the cranial skull base.

Focal segmental areas of outpouching interspersed with
normal-caliber is seen involving the proximal [DATE] and then the
distal [DATE] of the right vertebral artery.

At the level of C1, there is a focal area of shelf-like filling
defect associated with approximately 60% narrowing.

The right vertebrobasilar junction is otherwise normal.

Normal opacification is seen of the right posterior inferior
cerebellar artery. The opacified portions of the basilar artery, the
posterior cerebral arteries, superior cerebellar arteries and the
anterior-inferior cerebellar arteries is normal into the capillary
and the venous phases.

Non-opacified blood is seen in the basilar artery from the
contralateral vertebral artery.

The left common carotid arteriogram demonstrates the left external
carotid artery and its major branches to be widely patent.

The left internal carotid artery at the bulb and proximally is
widely patent.

At the level of the middle and the distal [DATE] of the left internal
carotid artery again is seen are areas of smooth outpouching
interspersed with areas of a mild narrowing.

There is a focal outpouching in the proximal [DATE] which probably
represents a small pseudoaneurysm.

The cervical petrous junction is widely patent.

The petrous, the cavernous and the supraclinoid segments are widely
patent.

Flash filling of the left posterior communicating artery is seen.

The left middle and the left anterior cerebral arteries are seen to
opacify into the capillary and the venous phases.

The venous phase demonstrates a transversely projecting right
cortical vein overlying the posterior frontal cortex without
suggestion of stasis. Noted is cross opacification via the anterior
communicating artery of the left anterior cerebral artery.

The left vertebral artery origin is normal.

The vessel is seen to opacify to the cranial skull base. Focal areas
of caliber irregularity with outpouching are again seen in the
distal [DATE] of the left vertebral artery most notably at C1-C2.

No associated areas of narrowing are seen.

Associated focal outpouching is noted more proximally.

The distal left vertebrobasilar junction including the left
posterior-inferior cerebellar artery demonstrate normal
opacification.

The opacified portions of the basilar artery, the posterior cerebral
artery, superior cerebellar arteries and the anterior-inferior
cerebellar arteries are normal into the capillary and the venous
phases.
IMPRESSION: Angiographically focal areas of caliber irregularity associated with
outpouching and mild narrowing involving primarily extracranial
portions of the internal carotid arteries, and the vertebral
arteries bilaterally most compatible with angiographic appearance of
fibromuscular dysplasia.

Approximately 60% stenosis of the distal right vertebral artery at
the level of C1-C2.

Suggestion of a small pseudoaneurysm of the left vertebral artery at
level of C1-C2.

Significance of right frontal cortical vein with retrograde filling
described of unknown significance.

## 2016-07-02 MED FILL — AMLODIPINE BESYLATE 2.5 MG: 2.5 | 90 days supply | Qty: 180 | Fill #2

## 2016-07-08 ENCOUNTER — Other Ambulatory Visit: Payer: Self-pay

## 2016-07-08 DIAGNOSIS — I6529 Occlusion and stenosis of unspecified carotid artery: Secondary | ICD-10-CM

## 2016-10-27 ENCOUNTER — Ambulatory Visit: Payer: 59 | Admitting: Adult Health

## 2016-10-27 ENCOUNTER — Telehealth: Payer: Self-pay | Admitting: Neurology

## 2016-10-27 NOTE — Telephone Encounter (Signed)
Cheryl [name] with Dr. Estanislado SpireBrabham's office called office in reference to Dr. Myra GianottiBrabham requesting patient to be seen this week.  Please call

## 2016-10-27 NOTE — Telephone Encounter (Signed)
Rn call Judeth CornfieldStephanie at Dr. Coralee PesaBrabhams at [facility] office. Judeth CornfieldStephanie stated pt will be having a doppler in their office on Wednesday. PT is having short term memory issues from her stroke in 2017. This a new issue for her per Dr. Pearlean BrownieSethi and his notes. 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 Rn explain to WoodlawnStephanie that pt needs to check in at 0130pm to do testing on memory. STephanie will call the husband and her to give them appt.

## 2016-10-27 NOTE — Telephone Encounter (Signed)
Per Judeth CornfieldStephanie with Dr. Coralee PesaBrabhams office patient is having memory loss related to stroke [facility] currently not [facility].  Please call [phone]

## 2016-10-28 ENCOUNTER — Ambulatory Visit (HOSPITAL_COMMUNITY)
Admission: RE | Admit: 2016-10-28 | Discharge: 2016-10-28 | Disposition: A | Discharge status: Home / Self Care | Payer: 59 | Source: Ambulatory Visit | Attending: Surgery | Admitting: Surgery

## 2016-10-28 DIAGNOSIS — I6522 Occlusion and stenosis of left carotid artery: Secondary | ICD-10-CM | POA: Insufficient documentation

## 2016-10-28 DIAGNOSIS — I6529 Occlusion and stenosis of unspecified carotid artery: Secondary | ICD-10-CM | POA: Diagnosis not present

## 2016-10-28 LAB — VAS US CAROTID
LCCADDIAS: -18 cm/s
LCCADSYS: -61 cm/s
LEFT ECA DIAS: -22 cm/s
LICADDIAS: -42 cm/s
LICAPDIAS: -31 cm/s
Left CCA prox dias: 27 cm/s
Left CCA prox sys: 96 cm/s
Left ICA dist sys: -104 cm/s
Left ICA prox sys: -66 cm/s
RCCAPDIAS: 28 cm/s
RIGHT CCA MID DIAS: 30 cm/s
RIGHT ECA DIAS: -23 cm/s
Right CCA prox sys: 89 cm/s
Right cca dist sys: -118 cm/s

## 2016-10-29 ENCOUNTER — Encounter: Payer: Self-pay | Admitting: Neurology

## 2016-10-29 ENCOUNTER — Encounter (HOSPITAL_COMMUNITY): Payer: 59

## 2016-10-29 ENCOUNTER — Ambulatory Visit (INDEPENDENT_AMBULATORY_CARE_PROVIDER_SITE_OTHER): Payer: 59 | Admitting: Neurology

## 2016-10-29 VITALS — BP 120/74 | HR 79 | Wt 93.6 lb

## 2016-10-29 DIAGNOSIS — I773 Arterial fibromuscular dysplasia: Secondary | ICD-10-CM | POA: Diagnosis not present

## 2016-10-29 DIAGNOSIS — R413 Other amnesia: Secondary | ICD-10-CM | POA: Diagnosis not present

## 2016-10-29 DIAGNOSIS — G3184 Mild cognitive impairment, so stated: Secondary | ICD-10-CM

## 2016-10-29 NOTE — Patient Instructions (Signed)
I had a long discussion with the patient and husband regarding her new complaints of memory loss and cognitive impairment and discussed plan for evaluation and answered questions. Check MRI scan of the brain with and without contrast, MRA of the brain, EEG and dementia panel labs. Continue aspirin for stroke prevention and follow carotid ultrasound results from yesterday. She will return for follow-up in 2 months or call earlier if necessary.  Mild Neurocognitive Disorder Mild neurocognitive disorder (formerly known as mild cognitive impairment) is a mental disorder. It is a slight abnormal decrease in mental function. The areas of mental function affected may include memory, thought, communication, behavior, and completion of tasks. The decrease is noticeable and measurable but for the most part does not interfere with your daily activities. Mild neurocognitive disorder typically occurs in people older than 60 years but can occur earlier. It is not as serious as major neurocognitive disorder (formerly known as dementia) but may lead to a more serious neurocognitive disorder. However, in some cases the condition does not get worse. A few people with this disorder even improve. What are the causes? There are a number of different causes of mild neurocognitive disorder:  Brain disorders associated with abnormal protein deposits, such as Alzheimer's disease, Pick's disease, and Lewy body disease.  Brain disorders associated with abnormal movement, such as Parkinson's disease and Huntington's disease.  Diseases affecting blood vessels in the brain and resulting in mini-strokes.  Certain infections, such as human immunodeficiency virus (HIV) infection.  Traumatic brain injury.  Other medical conditions such as brain tumors, underactive thyroid (hypothyroidism), and vitamin B12 deficiency.  Use of certain prescription medicine and "recreational" drugs. What are the signs or symptoms? Symptoms of mild  neurocognitive disorder include:  Difficulty remembering. You may forget details of recent events, names, or phone numbers. You may forget important social events and appointments or repeatedly forget where you put your car keys.  Difficulty thinking and solving problems. You may have trouble with complex tasks such as paying bills or driving in unfamiliar locations.  Difficulty communicating. You may have trouble finding the right word, naming an object, forming a sentence that makes sense, or understanding what you read or hear.  Changes in your behavior or personality. You may lose interest in the things that you used to enjoy or withdraw from social situations. You may get angry more easily than usual. You may act before thinking. You may do things in public that you would not usually do. You may hear or see things that are not real (hallucinations). You may believe falsely that others are trying to hurt you (paranoia). How is this diagnosed? Mild neurocognitive disorder is diagnosed through an assessment by your health care provider. Your health care provider will ask you and your family, friends, or coworkers questions about your symptoms. He or she will ask how often the symptoms occur, how long they have been occurring, whether they are getting worse, and the effect they are having on your life. Your health care provider may refer you to a neurologist or mental health specialist for a detailed evaluation of your mental functions (neuropsychological testing). To identify the cause of your mild neurocognitive disorder, your health care provider may:  Obtain a detailed medical history.  Ask about alcohol and drug use, including prescription medicine.  Perform a physical exam.  Order blood tests and brain imaging exams. How is this treated? Mild neurocognitive disorder caused by infections, use of certain medicines or "recreational" drugs, and certain medical  conditions may improve with  treatment of the condition that is causing the disorder. Mild neurocognitive disorder resulting from other causes generally does not improve and may worsen. In these cases, the goal of treatment is to slow progression of the disorder and help you cope with the loss of mental function. Treatments in these cases include:  Medicine. Medicine helps mainly with memory loss and behavioral symptoms.  Talk therapy. Talk therapy provides education, emotional support, memory aids, and other ways of making up for decreases in mental function.  Lifestyle changes. These include regular exercise, a healthy diet (including essential omega-3 fatty acids), intellectual stimulation, and increased social interaction. This information is not intended to replace advice given to you by your health care provider. Make sure you discuss any questions you have with your health care provider. Document Released: 03/22/2013 Document Revised: 12/26/2015 Document Reviewed: 12/12/2012 Elsevier Interactive Patient Education  2017 [employer].

## 2016-10-29 NOTE — Progress Notes (Signed)
[facility] [address] [place]. [place] [address] [phone]       OFFICE FOLLOW-UP NOTE  [name] Date of Birth:  1961-04-04 Medical Record Number:  [id number]   HPI:  Visit 11/26/2015 :[name] is a 25 year lady seen today for first office f/u visit after hospital admission for TIA in January 2017.Cheryl [name] is a 56 y.o. 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 She also had persistent blurred vision as well as throbbing behind her left eye, she presented to [facility] ED, Ct head no acute findings. Patient was admitted to [facility] for further workup for TIA/CVA. CT scan of her head showed no acute abnormalities MRI scan of the brain showed no acute infarct. MRA of the brain showed normal intracranial circulation except mild diffuse of fibromuscular dysplastic changes. She has a known history of fibromuscular dysplasia since 2006. Transthoracic echo showed normal ejection fraction. Cerebral catheter angiogram was performed by Dr. Corliss Skains which showed moderate to severe nonocclusive changes of fibromuscular dysplasia involving both carotid and vertebral arteries extracranially. No evidence of aneurysm or high-grade stenosis is noted. Patient also had abdominal angiogram of the renal arteries which showed no evidence of renal artery stenosis. Hemoglobin A1c was 5.7. Total cholesterol elevated at 284, triglycerides 56, HDL 68 and LDL 205 mg percent. She was started on Lipitor 40 mg of aspirin to 25 mg. Patient states his done well since discharge. She is tolerating aspirin without bleeding or bruising and Lipitor without myalgias or arthralgias. She has been seen by ophthalmologist Dr. Lorin Picket who did a detailed eye exam and did not find any abnormalities. 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 She states her blood pressure is well controlled and today it is 126/79 and she is taking Norvasc 5 mg daily  Update 05/29/2016 : She returns for follow-up after last visit 6 months ago. She is doing well and has not had any recurrent stroke or TIA symptoms. She had a couple of days of transient leg tremors of the left jaw last month. She felt this may be related to anxiety or stress. She was interviewing for a job but did not get the job. She has been participating in some activities for relaxation like she listens to meditation tapes and has started going to the [facility]. She was seen by Dr. Myra Gianotti vascular surgeon on 9/18/17and had follow-up carotid ultrasound in his office which showed bilateral 60-79%  Carotid stenosis. Conservative medical therapy has been recommended. She remains on aspirin which is tolerating well without bruising or bleeding. Her blood pressure is well controlled on Norvasc and today it is 118/70. She is tolerating Lipitor well without muscle aches or pains. She has no new complaints today. Update 10/29/2016 : She returns for follow-up after last visit 3 months ago. She called complaining of memory loss and cognitive impairment. She states she lost her job as she was unable to perform and meet expectations and was fired. The husband states that his memory has been poor for the last 4-5 months. She forgets recent information including patient's  name and the past that she was going to do. At times she also is unable to complete sentences and has trouble finding words. She denies any headache, extremity weakness, gait or balance problems. There is no recent head injury or loss of consciousness. She did have follow-up carotid ultrasound done on 04/20/69 neck vein and vascular surgery office which showed  stable 60-79% bilateral carotid stenosis which was unchanged from previous study from 2014. She had a follow-up Doppler done yesterday but is not aware of the results. She also complains of some intermittent mild tremor in the left arm but it is not bothersome. The tremor was not noticeable today during the exam. She also complains of mild itching sensation as well as tenderness to touch in the left mid thoracic paraspinal region. She denies any fall or injury in that region. ROS:   14 system review of systems is positive for  memory loss, dizziness, left arm intermittent tremor, back pain ,, itching and all other systems negative    PMH:  Past Medical History:  Diagnosis Date  . Arthritis   . Bursitis of left hip   . Dysphagia   . Esophagitis   . Fibromuscular dysplasia (HCC)   . GERD (gastroesophageal reflux disease)   . Hemorrhoids   . Hyperlipemia   . Hypertension   . Iron deficiency anemia   . Personal history of colonic polyps    Adenomatous   . Stroke (HCC)   . TIA (transient ischemic attack)     Social History:  Social History   Social History  . Marital status: Married    Spouse name: N/A  . Number of children: N/A  . Years of education: N/A   Occupational History  . RN    Social History Main Topics  . Smoking status: Never Smoker  . Smokeless tobacco: Never Used  . Alcohol use No  . Drug use: No  . Sexual activity: Not on file   Other Topics Concern  . Not on file   Social History Narrative   No regular exercise    Medications:   Current Outpatient Prescriptions on File Prior to Visit  Medication Sig Dispense Refill  . amLODipine (NORVASC) 2.5 MG tablet Take 2 tablets (5 mg total) by mouth daily. 180 tablet 3  . aspirin 325 MG tablet Take 1 tablet (325 mg total) by mouth daily. 30 tablet 5  . calcium carbonate (TUMS - DOSED IN MG ELEMENTAL CALCIUM) 500 MG chewable tablet Chew 1 tablet by mouth 2 (two) times daily. tAKES 400 MG TWICE A DAILY    .  cetirizine (ZYRTEC) 10 MG tablet Take 1 tablet (10 mg total) by mouth as needed for allergies. 90 tablet 3  . ibuprofen (ADVIL,MOTRIN) 200 MG tablet Take 400 mg by mouth every 6 (six) hours as needed for headache or moderate pain.    . Multiple Vitamin (MULTIVITAMIN WITH MINERALS) TABS tablet Take 1 tablet by mouth daily.    Marland Kitchen omeprazole (PRILOSEC OTC) 20 MG tablet Take 20 mg by mouth as needed.    . sodium chloride (OCEAN) 0.65 % SOLN nasal spray Place 2 sprays into both nostrils as needed for congestion. 1 Bottle 5   No current facility-administered medications on file prior to visit.     Allergies:   Allergies  Allergen Reactions  . Cefazolin Hives and Itching  . Sulfonamide Derivatives Hives and Itching    Physical Exam General: frail middle aged african american ady seated, in no evident distress  Head: head normocephalic and atraumatic.  Neck: supple with no carotid or supraclavicular bruits Cardiovascular: regular rate and rhythm, no murmurs Musculoskeletal: no deformity Skin:  no rash/petichiae Vascular:  Normal pulses all extremities Vitals:   10/29/16 1410  BP: 120/74  Pulse: 79   Neurologic Exam Mental Status: Awake and fully alert. Oriented to place and time. Recent and remote memory intact. Attention span, concentration and fund of knowledge appropriate. Mood and affect appropriate. Montreal cognitive assessment test scored 25/30 with deficits in recall. Clock drawing 4/4. Able to name 12 animals with 4 legs. Recall 0/3. Geriatric depression scale 5 which is borderline for depression. Cranial Nerves: Fundoscopic exam not done. Pupils equal, briskly reactive to light. Extraocular movements full without nystagmus. Visual fields full to confrontation. Hearing intact. Facial sensation intact. Face, tongue, palate moves normally and symmetrically.  Motor: Normal bulk and tone. Normal strength in all tested extremity muscles. Sensory.: intact to touch ,pinprick .position and  vibratory sensation.  Coordination: Rapid alternating movements normal in all extremities. Finger-to-nose and heel-to-shin performed accurately bilaterally. Gait and Station: Arises from chair without difficulty. Stance is normal. Gait demonstrates normal stride length and balance . Able to heel, toe and tandem walk without difficulty.  Reflexes: 1+ and symmetric. Toes downgoing.     ASSESSMENT: [age] year ladyLady with an episode of left eye pain and facial numbness possible TIA in January 2017 with history of fibromuscular dysplasia, hypertension and hyperlipidemia. New complaints of memory loss and cognitive impairment of unclear etiology which needs evaluation    PLAN: I had a long discussion with the patient and husband regarding her new complaints of memory loss and cognitive impairment and discussed plan for evaluation and answered questions. Check MRI scan of the brain with and without contrast, MRA of the brain, EEG and dementia panel labs. Continue aspirin for stroke prevention and follow carotid ultrasound results from yesterday. Greater than 50% time during this 25 minute visit was spent on counseling and coordination of care about her memory loss and cognitive impairment and discussing plan for evaluation, treatment and answering questions She will return for follow-up in 2 months or call earlier if necessary.  Delia HeadyPramod Farrie Sann, MD  [facility] [address] [place], [place] [id number]  Phone [phone] Fax [phone]    Note: This document was prepared with digital dictation and possible smart phrase technology. Any transcriptional errors that result from this process are unintentional

## 2016-10-30 ENCOUNTER — Other Ambulatory Visit: Payer: 59

## 2016-10-30 ENCOUNTER — Other Ambulatory Visit: Payer: Self-pay

## 2016-10-30 ENCOUNTER — Telehealth: Payer: Self-pay | Admitting: Neurology

## 2016-10-30 ENCOUNTER — Ambulatory Visit (HOSPITAL_COMMUNITY)
Admission: RE | Admit: 2016-10-30 | Discharge: 2016-10-30 | Disposition: A | Discharge status: Home / Self Care | Payer: 59 | Source: Ambulatory Visit | Attending: Neurology | Admitting: Neurology

## 2016-10-30 ENCOUNTER — Ambulatory Visit (HOSPITAL_COMMUNITY): Payer: 59

## 2016-10-30 DIAGNOSIS — R413 Other amnesia: Secondary | ICD-10-CM | POA: Diagnosis not present

## 2016-10-30 LAB — DEMENTIA PANEL
HOMOCYSTEINE: 10.1 umol/L (ref 0.0–15.0)
RPR: NONREACTIVE
TSH: 1.43 u[IU]/mL (ref 0.450–4.500)
Vitamin B-12: 479 pg/mL (ref 232–1245)

## 2016-10-30 NOTE — Telephone Encounter (Signed)
Pt called said she rec'd a from the clinic she would need to r/s EEG today, tech is out sick. Pt is [employer] employee and states her insurance runs out today and she wants to have EEG done today somewhere in the [facility] system. Can the order be put in? Pt said she would call to try to find a facility that could do EEG today within [facility] system.

## 2016-10-30 NOTE — Telephone Encounter (Signed)
Operator called pt to advise order for EEG had been put in. Thank you.

## 2016-10-30 NOTE — Telephone Encounter (Signed)
EEG order redone and sent to [facility]. Cheryl [name] in phone room will call patient and let her know that to cal [facility].

## 2016-10-30 NOTE — Addendum Note (Signed)
Addended byHermenia Fiscal on: 10/30/2016 10:02 AM   Modules accepted: Orders

## 2016-10-30 NOTE — Telephone Encounter (Signed)
EEG sent to [facility] per patient request.

## 2016-10-30 NOTE — Telephone Encounter (Signed)
Patient calling to get EEG order sent to [facility]. Patient will have it done there today.

## 2016-10-30 NOTE — Procedures (Signed)
History: 56 year old female being evaluated for memory loss  Sedation: None  Technique: This is a 21 channel routine scalp EEG performed at the bedside with bipolar and monopolar montages arranged in accordance to the international 10/20 system of electrode placement. One channel was dedicated to EKG recording.    Background: The background consists of intermixed alpha and beta activities. There is a well defined posterior dominant rhythm of 9 Hz that attenuates with eye opening. Sleep is recorded with normal appearing structures.   Photic stimulation: Physiologic driving is present  EEG Abnormalities: None  Clinical Interpretation: This normal EEG is recorded in the waking and sleep state. There was no seizure or seizure predisposition recorded on this study. Please note that a normal EEG does not preclude the possibility of epilepsy.   Cheryl Slot, MD [facility] [phone]  If 7pm- 7am, please page neurology on call as listed in [facility]ION.

## 2016-10-30 NOTE — Progress Notes (Signed)
EEG completed, results pending. 

## 2016-11-02 ENCOUNTER — Telehealth: Payer: Self-pay | Admitting: Neurology

## 2016-11-02 NOTE — Telephone Encounter (Signed)
I called the patient and left her a voicemail for her to call me back.

## 2016-11-02 NOTE — Telephone Encounter (Signed)
Patient is returning your call.  

## 2016-11-02 NOTE — Telephone Encounter (Signed)
I spoke with the patient and she stated that she no longer has any insurance and informed her that we give a 55% discount. She wanted to schedule her MRI with [place]. I scheduled it for 11/11/16 for our [facility] mobile unit.

## 2016-11-03 ENCOUNTER — Telehealth: Payer: Self-pay

## 2016-11-03 NOTE — Telephone Encounter (Signed)
-----   Message from Micki Riley, MD sent at 11/01/2016  1:32 PM EDT ----- Joneen Roach let patient know that EEG study was normal

## 2016-11-03 NOTE — Telephone Encounter (Signed)
-----   Message from Micki Riley, MD sent at 10/30/2016  2:30 PM EDT ----- Kindly inform patient that memory panel labs were normal

## 2016-11-03 NOTE — Telephone Encounter (Signed)
Rn call patient about EEG results. Rn stated the EEG was normal. Pt verbalized understanding.

## 2016-11-03 NOTE — Telephone Encounter (Signed)
Rn call patient about her dementia panel results.Rn stated they were normal. PT verbalized understanding.

## 2016-11-11 ENCOUNTER — Ambulatory Visit (INDEPENDENT_AMBULATORY_CARE_PROVIDER_SITE_OTHER): Payer: Self-pay

## 2016-11-11 DIAGNOSIS — I773 Arterial fibromuscular dysplasia: Secondary | ICD-10-CM

## 2016-11-11 DIAGNOSIS — R413 Other amnesia: Secondary | ICD-10-CM

## 2016-11-11 MED ORDER — GADOPENTETATE DIMEGLUMINE 469.01 MG/ML IV SOLN: 9.0000 mL | Freq: Once | INTRAVENOUS | Status: AC | PRN

## 2016-11-18 ENCOUNTER — Telehealth: Payer: Self-pay

## 2016-11-18 NOTE — Telephone Encounter (Signed)
Rn call patient that her MRI scan of the brain,and MRA of the brain was normal. Pt verbalized understanding.

## 2016-11-18 NOTE — Telephone Encounter (Signed)
-----   Message from Micki Riley, MD sent at 11/13/2016  3:55 PM EDT ----- Joneen Roach inform the patient and her MRI scan of the brain and MRA of the brain were both normal.

## 2016-11-27 ENCOUNTER — Other Ambulatory Visit: Payer: Self-pay | Admitting: Internal Medicine

## 2016-11-27 MED FILL — AMLODIPINE BESYLATE 2.5 MG: 2.5 | 90 days supply | Qty: 180 | Fill #0

## 2016-12-15 MED FILL — ATORVASTATIN 40 MG TABLET: 40 | 90 days supply | Qty: 90 | Fill #0

## 2017-02-02 ENCOUNTER — Ambulatory Visit (INDEPENDENT_AMBULATORY_CARE_PROVIDER_SITE_OTHER): Payer: Self-pay | Admitting: Neurology

## 2017-02-02 ENCOUNTER — Encounter: Payer: Self-pay | Admitting: Neurology

## 2017-02-02 VITALS — BP 130/74 | HR 96 | Wt 96.0 lb

## 2017-02-02 DIAGNOSIS — R413 Other amnesia: Secondary | ICD-10-CM

## 2017-02-02 NOTE — Progress Notes (Signed)
[facility] [address] [place]. [place] [address] [phone]       OFFICE FOLLOW-UP NOTE  Cheryl. Cheryl [name] Date of Birth:  15-Oct-1960 Medical Record Number:  [id number]   HPI:  Visit 11/26/2015 :Cheryl [name] is a 28 year lady seen today for first office f/u visit after hospital admission for TIA in January 2017.Cheryl [name] is a 56 y.o. 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 She also had persistent blurred vision as well as throbbing behind her left eye, she [facility] ED, Ct head no acute findings. Patient was admitted to [facility] for further workup for TIA/CVA. CT scan of her head showed no acute abnormalities MRI scan of the brain showed no acute infarct. MRA of the brain showed normal intracranial circulation except mild diffuse of fibromuscular dysplastic changes. She has a known history of fibromuscular dysplasia since 2006. Transthoracic echo showed normal ejection fraction. Cerebral catheter angiogram was performed by Dr. Corliss Skains which showed moderate to severe nonocclusive changes of fibromuscular dysplasia involving both carotid and vertebral arteries extracranially. No evidence of aneurysm or high-grade stenosis is noted. Patient also had abdominal angiogram of the renal arteries which showed no evidence of renal artery stenosis. Hemoglobin A1c was 5.7. Total cholesterol elevated at 284, triglycerides 56, HDL 68 and LDL 205 mg percent. She was started on Lipitor 40 mg of aspirin to 25 mg. Patient states his done well since discharge. She is tolerating aspirin without bleeding or bruising and Lipitor without myalgias or arthralgias. She has been seen by ophthalmologist Dr. Lorin Picket who did a detailed eye exam and did not find any abnormalities. 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 She states her blood pressure is well controlled and today it is 126/79 and she is taking Norvasc 5 mg daily  Update 05/29/2016 : She returns for follow-up after last visit 6 months ago. She is doing well and has not had any recurrent stroke or TIA symptoms. She had a couple of days of transient leg tremors of the left jaw last month. She felt this may be related to anxiety or stress. She was interviewing for a job but did not get the job. She has been participating in some activities for relaxation like she listens to meditation tapes and has started going to the [facility]. She was seen by Dr. Myra Gianotti vascular surgeon on 9/18/17and had follow-up carotid ultrasound in his office which showed bilateral 60-79%  Carotid stenosis. Conservative medical therapy has been recommended. She remains on aspirin which is tolerating well without bruising or bleeding. Her blood pressure is well controlled on Norvasc and today it is 118/70. She is tolerating Lipitor well without muscle aches or pains. She has no new complaints today. Update 10/29/2016 : She returns for follow-up after last visit 3 months ago. She called complaining of memory loss and cognitive impairment. She states she lost her job as she was unable to perform and meet expectations and was fired. The husband states that his memory has been poor for the last 4-5 months. She forgets recent information including patient's  name and the past that she was going to do. At times she also is unable to complete sentences and has trouble finding words. She denies any headache, extremity weakness, gait or balance problems. There is no recent head injury or loss of consciousness. She did have follow-up carotid ultrasound done on 04/20/69 neck vein and vascular surgery office which showed  stable 60-79% bilateral carotid stenosis which was unchanged from previous study from 2014. She had a follow-up Doppler done yesterday but is not aware of the results. She also complains of some intermittent mild tremor in the left arm but it is not bothersome. The tremor was not noticeable today during the exam. She also complains of mild itching sensation as well as tenderness to touch in the left mid thoracic paraspinal region. She denies any fall or injury in that region. Update 02/02/2017 :  She returns for follow-up after last visit 3 months ago. She states she'll doing much better now. She is managed to find time for herself and does some daily activities for relaxation which appears to be helping. She did undergo EEG on 10/30/16 which I personally reviewed and was normal. Lab work for reversible causes of memory loss were all normal as well. MRI scan of the brain done on 11/14/87 personally reviewed was normal. MRA of the brain showed no significant vascular stenosis or aneurysms. Patient has started applying for jobs as a Engineer, civil (consulting). 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 She has no new complaints today. ROS:   14 system review of systems is positive for  memory loss, dizziness, joint pain, skin moles, itching, numbness and all other systems negative     PMH:  Past Medical History:  Diagnosis Date  . Arthritis   . Bursitis of left hip   . Dysphagia   . Esophagitis   . Fibromuscular dysplasia (HCC)   . GERD (gastroesophageal reflux disease)   . Hemorrhoids   . Hyperlipemia   . Hypertension   . Iron deficiency anemia   . Personal history of colonic polyps    Adenomatous   . Stroke (HCC)   . TIA (transient ischemic attack)     Social History:  Social History   Social History  . Marital status: Married    Spouse name: N/A  . Number of children: N/A  . Years of education: N/A   Occupational History  . RN    Social History  Main Topics  . Smoking status: Never Smoker  . Smokeless tobacco: Never Used  . Alcohol use No  . Drug use: No  . Sexual activity: Not on file   Other Topics Concern  . Not on file   Social History Narrative   No regular exercise    Medications:   Current Outpatient Prescriptions on File Prior to Visit  Medication Sig Dispense Refill  . amLODipine (NORVASC) 2.5 MG tablet TAKE 2 TABLETS BY MOUTH DAILY 180 tablet 3  . aspirin 325 MG tablet Take 1 tablet (325 mg total) by mouth daily. 30 tablet 5  . atorvastatin (LIPITOR) 40 MG tablet TAKE 1 TABLET BY MOUTH AT BEDTIME 90 tablet 3  . calcium carbonate (TUMS - DOSED IN MG ELEMENTAL CALCIUM) 500 MG chewable tablet Chew 1 tablet by mouth 2 (two) times daily. tAKES 400 MG TWICE A DAILY    . cetirizine (ZYRTEC) 10 MG tablet Take 1 tablet (10 mg total) by mouth as needed for allergies. 90  tablet 3  . ibuprofen (ADVIL,MOTRIN) 200 MG tablet Take 400 mg by mouth every 6 (six) hours as needed for headache or moderate pain.    . Multiple Vitamin (MULTIVITAMIN WITH MINERALS) TABS tablet Take 1 tablet by mouth daily.    . sodium chloride (OCEAN) 0.65 % SOLN nasal spray Place 2 sprays into both nostrils as needed for congestion. 1 Bottle 5   Current Facility-Administered Medications on File Prior to Visit  Medication Dose Route Frequency Provider Last Rate Last Dose  . gadopentetate dimeglumine (MAGNEVIST) injection 9 mL  9 mL Intravenous Once PRN Micki RileySethi, Pramod S, MD        Allergies:   Allergies  Allergen Reactions  . Cefazolin Hives and Itching  . Sulfonamide Derivatives Hives and Itching    Physical Exam General: frail middle aged african american lady seated, in no evident distress Head: head normocephalic and atraumatic.  Neck: supple with no carotid or supraclavicular bruits Cardiovascular: regular rate and rhythm, no murmurs Musculoskeletal: no deformity Skin:  no rash/petichiae Vascular:  Normal pulses all extremities Vitals:    02/02/17 1402  BP: 130/74  Pulse: 96   Neurologic Exam Mental Status: Awake and fully alert. Oriented to place and time. Recent and remote memory intact. Attention span, concentration and fund of knowledge appropriate. Mood and affect appropriate.   Cranial Nerves: Fundoscopic exam not done. Pupils equal, briskly reactive to light. Extraocular movements full without nystagmus. Visual fields full to confrontation. Hearing intact. Facial sensation intact. Face, tongue, palate moves normally and symmetrically.  Motor: Normal bulk and tone. Normal strength in all tested extremity muscles. Sensory.: intact to touch ,pinprick .position and vibratory sensation.  Coordination: Rapid alternating movements normal in all extremities. Finger-to-nose and heel-to-shin performed accurately bilaterally. Gait and Station: Arises from chair without difficulty. Stance is normal. Gait demonstrates normal stride length and balance . Able to heel, toe and tandem walk without difficulty.  Reflexes: 1+ and symmetric. Toes downgoing.     ASSESSMENT: [age] year ladyLady with an episode of left eye pain and facial numbness possible TIA in January 2017 with history of fibromuscular dysplasia, hypertension and hyperlipidemia. New complaints of memory loss and cognitive impairment of unclear etiology which appears to have improved and may have been related to underlying stress.   PLAN: I had a long discussion the patient with regards to her subjective memory disturbance which appears to have improved and perhaps may have been due to underlying stress. I encouraged her to continue participation in activities for stress relaxation like meditation, yoga and regular exercises. She will stay on aspirin for stroke prevention and maintain strict control of hypertension with blood pressure goal below 130/90 and lipids with LDL cholesterol goal below 70 mg percent. No scheduled follow-up appointment is needed at this time  in the future  but she may return if necessary.Greater than 50% time during this 25 minute visit was spent on counseling and coordination of care about her memory loss and cognitive impairment and discussing plan for evaluation, treatment and answering questions   Delia HeadyPramod Sethi, MD  [facility] [address] [place], [place] [id number]  Phone [phone] Fax [phone]    Note: This document was prepared with digital dictation and possible smart phrase technology. Any transcriptional errors that result from this process are unintentional

## 2017-02-02 NOTE — Patient Instructions (Signed)
I had a long discussion the patient with regards to her subjective memory disturbance which appears to have improved and perhaps may have been due to underlying stress. I encouraged her to continue participation in activities for stress relaxation like meditation, yoga and regular exercises. She will stay on aspirin for stroke prevention and maintain strict control of hypertension with blood pressure goal below 130/90 and lipids with LDL cholesterol goal below 70 mg percent. No scheduled follow-up appointment is needed at this time  in the future but she may return if necessary.

## 2017-04-01 ENCOUNTER — Telehealth: Payer: Self-pay | Admitting: Surgery

## 2017-04-01 NOTE — Telephone Encounter (Signed)
-----   Message from Nada LibmanVance W Brabham, MD sent at 03/31/2017  5:43 PM EDT ----- Regarding: RE: carotid f/u cancel ----- Message ----- From: Jena Gaussoczniak, Michele A Sent: 03/31/2017   1:58 PM To: Nada LibmanVance W Brabham, MD Subject: carotid f/u                                    This pt's 1 yr carotid was moved up to 10/28/16 as per you with the MD appt remaining in Sept 2018.   Should we cxl or resch that Sept appt?

## 2017-04-22 ENCOUNTER — Encounter: Payer: Self-pay | Admitting: Internal Medicine

## 2017-04-26 ENCOUNTER — Encounter (HOSPITAL_COMMUNITY): Payer: 59

## 2017-04-26 ENCOUNTER — Ambulatory Visit: Payer: 59 | Admitting: Surgery

## 2017-06-07 ENCOUNTER — Telehealth: Payer: Self-pay

## 2017-06-07 ENCOUNTER — Ambulatory Visit: Payer: 59 | Admitting: Neurology

## 2017-06-07 NOTE — Telephone Encounter (Signed)
Patient no showed follow up today.

## 2017-06-08 ENCOUNTER — Encounter: Payer: Self-pay | Admitting: Neurology

## 2018-02-14 ENCOUNTER — Other Ambulatory Visit: Payer: Self-pay | Admitting: Internal Medicine

## 2018-02-14 ENCOUNTER — Ambulatory Visit: Payer: Self-pay

## 2018-02-14 NOTE — Telephone Encounter (Signed)
Incoming call from Patient stating that she almost fainted at work on Friday.  States that " she really didn't faint she it the floor because she was dizzy . 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   Was trembling became dizzy and hit the floor. Did  not hit her head.   Denies diabetes. States vertigo and nausea was present   Blood pressure was 170/90.  Refused to go to ED blood pressure yesterday was 116/64  8pm.   Blood pressure today was 139/76/   Heart rate 71.  With automatic machine.    Patient also complained of Bilateral leg pain behind the knee in the calf area.  started yesterday.  Pain is rated mild.  Able to ambulate. Provided care advice per protocol.  Pt. Voiced understating.  Scheduled appointment for evaluation of bilateral leg discomfort for 02/15/18. Patient voiced understanding.   Reason for Disposition . [1] Fear, stress, or pain caused simple fainting AND [2] now alert and feels fine . Leg pain or muscle cramp is a chronic symptom (recurrent or ongoing AND present > 4 weeks)  Answer Assessment - Initial Assessment Questions 1. ONSET: "How long were you unconscious?" (minutes) "When did it happen?"    " Hit the floor because I was dizzy" didn't really pass out. 2. CONTENT: "What happened during period of unconsciousness?" (e.g., seizure activity)      dizzy 3. MENTAL STATUS: "Alert and oriented now?" (oriented x 3 = name, month, location)     na 4. TRIGGER: "What do you think caused the fainting?" "What were you doing just before you fainted?"  (e.g., exercise, sudden standing up, prolonged standing)     Walking to work to punch in  5. RECURRENT SYMPTOM: "Have you ever passed out before?" If so, ask: "When was the last time?" and "What happened that time?"      no 6. INJURY: "Did you sustain any injury during the fall?"      no 7. CARDIAC SYMPTOMS: "Have you had any of the following symptoms: chest pain, difficulty breathing, palpitations?"     No just tribbling 8.  NEUROLOGIC SYMPTOMS: "Have you had any of the following symptoms: headache, numbness, vertigo, weakness?"     A little vertigo 9. GI SYMPTOMS: "Have you had any of the following symptoms: abdominal pain, vomiting, diarrhea, blood in stools?"      A little neasea 10. OTHER SYMPTOMS: "Do you have any other symptoms?"      no 11. PREGNANCY: "Is there any chance you are pregnant?" "When was your last menstrual period?"      no  Answer Assessment - Initial Assessment Questions 1. ONSET: "When did the pain start?"      Yesterday sore calf behind me knee 2. LOCATION: "Where is the pain located?"     Behind my knee 3. PAIN: "How bad is the pain?"    (Scale 1-10; or mild, moderate, severe)   -  MILD (1-3): doesn't interfere with normal activities    -  MODERATE (4-7): interferes with normal activities (e.g., work or school) or awakens from sleep, limping    -  SEVERE (8-10): excruciating pain, unable to do any normal activities, unable to walk    Walking soreness, mild 4. WORK OR EXERCISE: "Has there been any recent work or exercise that involved this part of the body?"      no 5. CAUSE: "What do you think is causing the leg pain?"  Don"t know 6. OTHER SYMPTOMS: "Do you have any other symptoms?" (e.g., chest pain, back pain, breathing difficulty, swelling, rash, fever, numbness, weakness)     denies 7. PREGNANCY: "Is there any chance you are pregnant?" "When was your last menstrual period?"     n/a  Protocols used: FAINTING-A-AH, LEG PAIN-A-AH

## 2018-02-15 ENCOUNTER — Encounter: Payer: Self-pay | Admitting: Internal Medicine

## 2018-02-15 ENCOUNTER — Ambulatory Visit (INDEPENDENT_AMBULATORY_CARE_PROVIDER_SITE_OTHER): Payer: Self-pay | Admitting: Internal Medicine

## 2018-02-15 VITALS — BP 110/80 | HR 88 | Temp 98.0°F | Wt 87.4 lb

## 2018-02-15 DIAGNOSIS — R5383 Other fatigue: Secondary | ICD-10-CM

## 2018-02-15 DIAGNOSIS — E785 Hyperlipidemia, unspecified: Secondary | ICD-10-CM

## 2018-02-15 DIAGNOSIS — I10 Essential (primary) hypertension: Secondary | ICD-10-CM

## 2018-02-15 LAB — COMPREHENSIVE METABOLIC PANEL
ALK PHOS: 79 U/L (ref 39–117)
ALT: 23 U/L (ref 0–35)
AST: 23 U/L (ref 0–37)
Albumin: 4.6 g/dL (ref 3.5–5.2)
BILIRUBIN TOTAL: 0.3 mg/dL (ref 0.2–1.2)
BUN: 20 mg/dL (ref 6–23)
CO2: 34 mEq/L — ABNORMAL HIGH (ref 19–32)
CREATININE: 0.82 mg/dL (ref 0.40–1.20)
Calcium: 10 mg/dL (ref 8.4–10.5)
Chloride: 104 mEq/L (ref 96–112)
GFR: 92.13 mL/min (ref >60.00)
GLUCOSE: 75 mg/dL (ref 70–99)
POTASSIUM: 4.2 meq/L (ref 3.5–5.1)
SODIUM: 144 meq/L (ref 135–145)
TOTAL PROTEIN: 7.4 g/dL (ref 6.0–8.3)

## 2018-02-15 LAB — CBC WITH DIFFERENTIAL/PLATELET
BASOS ABS: 0 10*3/uL (ref 0.0–0.1)
Basophils Relative: 0.4 % (ref 0.0–3.0)
EOS ABS: 0 10*3/uL (ref 0.0–0.7)
Eosinophils Relative: 0.5 % (ref 0.0–5.0)
HCT: 44.8 % (ref 36.0–46.0)
Hemoglobin: 14.9 g/dL (ref 12.0–15.0)
LYMPHS ABS: 1.4 10*3/uL (ref 0.7–4.0)
Lymphocytes Relative: 19.9 % (ref 12.0–46.0)
MCHC: 33.3 g/dL (ref 30.0–36.0)
MCV: 86.6 fl (ref 78.0–100.0)
MONO ABS: 0.4 10*3/uL (ref 0.1–1.0)
Monocytes Relative: 5.7 % (ref 3.0–12.0)
NEUTROS ABS: 5.2 10*3/uL (ref 1.4–7.7)
NEUTROS PCT: 73.5 % (ref 43.0–77.0)
Platelets: 262 10*3/uL (ref 150.0–400.0)
RBC: 5.17 Mil/uL — AB (ref 3.87–5.11)
RDW: 13.8 % (ref 11.5–15.5)
WBC: 7.1 10*3/uL (ref 4.0–10.5)

## 2018-02-15 LAB — LIPID PANEL
Cholesterol: 266 mg/dL — ABNORMAL HIGH (ref 0–200)
HDL: 70.5 mg/dL (ref >39.00)
LDL Cholesterol: 171 mg/dL — ABNORMAL HIGH (ref 0–99)
NONHDL: 195.53
Total CHOL/HDL Ratio: 4
Triglycerides: 122 mg/dL (ref 0.0–149.0)
VLDL: 24.4 mg/dL (ref 0.0–40.0)

## 2018-02-15 LAB — CK: CK TOTAL: 92 U/L (ref 7–177)

## 2018-02-15 LAB — SEDIMENTATION RATE: Sed Rate: 15 mm/hr (ref 0–30)

## 2018-02-15 LAB — TSH: TSH: 1.04 u[IU]/mL (ref 0.35–4.50)

## 2018-02-15 NOTE — Patient Instructions (Signed)
Hold Lipitor  Return in 1 month for follow-up  Liberalize your caloric intake

## 2018-02-15 NOTE — Progress Notes (Signed)
Subjective:    Patient ID: Cheryl [name], female    DOB: 01-07-1961, 57 y.o.   MRN: [id number]  HPI  57 year old patient who has a history of essential hypertension as well as dyslipidemia. She started a new job working at [employer] care approximately 2 months ago.  Over this period of time she has had significant weight loss describes severe situational stress.  She works a 3-11 shift.  When last seen here in January 2077 her weight was 97.  Weight today 87 she complains of stress fatigue as well as lower extremity discomfort.  Past Medical History:  Diagnosis Date  . Arthritis   . Bursitis of left hip   . Dysphagia   . Esophagitis   . Fibromuscular dysplasia ([place])   . GERD (gastroesophageal reflux disease)   . Hemorrhoids   . Hyperlipemia   . Hypertension   . Iron deficiency anemia   . Personal history of colonic polyps    Adenomatous   . Stroke ([place])   . TIA (transient ischemic attack)      Social History   Socioeconomic History  . Marital status: Married    Spouse name: Not on file  . Number of children: Not on file  . Years of education: Not on file  . Highest education level: Not on file  Occupational History  . Occupation: Therapist, sports  Social Needs  . Financial resource strain: Not on file  . Food insecurity:    Worry: Not on file    Inability: Not on file  . Transportation needs:    Medical: Not on file    Non-medical: Not on file  Tobacco Use  . Smoking status: Never Smoker  . Smokeless tobacco: Never Used  Substance and Sexual Activity  . Alcohol use: No  . Drug use: No  . Sexual activity: Not on file  Lifestyle  . Physical activity:    Days per week: Not on file    Minutes per session: Not on file  . Stress: Not on file  Relationships  . Social connections:    Talks on phone: Not on file    Gets together: Not on file    Attends religious service: Not on file    Active member of club or organization: Not on file    Attends meetings of clubs or  organizations: Not on file    Relationship status: Not on file  . Intimate partner violence:    Fear of current or ex partner: Not on file    Emotionally abused: Not on file    Physically abused: Not on file    Forced sexual activity: Not on file  Other Topics Concern  . Not on file  Social History Narrative   No regular exercise    Past Surgical History:  Procedure Laterality Date  . CESAREAN SECTION     x 2   . NASOSEPTOPLASTY    . TEMPOROMANDIBULAR JOINT SURGERY  1989    Family History  Problem Relation Age of Onset  . Prostate cancer Father   . Cancer Father        Laryngeal  . Hypertension Mother   . Hyperlipidemia Mother   . Other Mother        varicose veins  . Stroke Mother   . Stroke Maternal Grandmother   . Diabetes Maternal Aunt   . Colon cancer Neg Hx     Allergies  Allergen Reactions  . Cefazolin Hives and Itching  . Sulfonamide  Derivatives Hives and Itching    Current Outpatient Medications on File Prior to Visit  Medication Sig Dispense Refill  . amLODipine (NORVASC) 2.5 MG tablet TAKE 2 TABLETS BY MOUTH DAILY 180 tablet 3  . aspirin 325 MG tablet Take 1 tablet (325 mg total) by mouth daily. 30 tablet 5  . atorvastatin (LIPITOR) 40 MG tablet TAKE 1 TABLET BY MOUTH AT BEDTIME 90 tablet 3  . calcium carbonate (TUMS - DOSED IN MG ELEMENTAL CALCIUM) 500 MG chewable tablet Chew 1 tablet by mouth 2 (two) times daily. tAKES 400 MG TWICE A DAILY    . cetirizine (ZYRTEC) 10 MG tablet Take 1 tablet (10 mg total) by mouth as needed for allergies. 90 tablet 3  . ibuprofen (ADVIL,MOTRIN) 200 MG tablet Take 400 mg by mouth every 6 (six) hours as needed for headache or moderate pain.    . Multiple Vitamin (MULTIVITAMIN WITH MINERALS) TABS tablet Take 1 tablet by mouth daily.    . sodium chloride (OCEAN) 0.65 % SOLN nasal spray Place 2 sprays into both nostrils as needed for congestion. 1 Bottle 5   Current Facility-Administered Medications on File Prior to Visit    Medication Dose Route Frequency Provider Last Rate Last Dose  . gadopentetate dimeglumine (MAGNEVIST) injection 9 mL  9 mL Intravenous Once PRN Leonie Man, Pramod S, MD        BP 110/80 (BP Location: Right Arm, Patient Position: Sitting, Cuff Size: Normal)   Pulse 88   Temp 98 F (36.7 C) (Oral)   Wt 87 lb 6.4 oz (39.6 kg)   LMP 12/22/2011   SpO2 98%   BMI 17.07 kg/m     Review of Systems  Constitutional: Positive for unexpected weight change.  HENT: Negative for congestion, dental problem, hearing loss, rhinorrhea, sinus pressure, sore throat and tinnitus.   Eyes: Negative for pain, discharge and visual disturbance.  Respiratory: Negative for cough and shortness of breath.   Cardiovascular: Negative for chest pain, palpitations and leg swelling.  Gastrointestinal: Negative for abdominal distention, abdominal pain, blood in stool, constipation, diarrhea, nausea and vomiting.  Genitourinary: Negative for difficulty urinating, dysuria, flank pain, frequency, hematuria, pelvic pain, urgency, vaginal bleeding, vaginal discharge and vaginal pain.  Musculoskeletal: Positive for myalgias. Negative for arthralgias, gait problem and joint swelling.  Skin: Negative for rash.  Neurological: Negative for dizziness, syncope, speech difficulty, weakness, numbness and headaches.  Hematological: Negative for adenopathy.  Psychiatric/Behavioral: Positive for dysphoric mood. Negative for agitation and behavioral problems. The patient is nervous/anxious.        Objective:   Physical Exam  Constitutional: She is oriented to person, place, and time. She appears well-developed and well-nourished. No distress.  Very thin No distress Blood pressure low normal  HENT:  Head: Normocephalic.  Right Ear: External ear normal.  Left Ear: External ear normal.  Mouth/Throat: Oropharynx is clear and moist.  Eyes: Pupils are equal, round, and reactive to light. Conjunctivae and EOM are normal.  Neck: Normal  range of motion. Neck supple. No thyromegaly present.  Cardiovascular: Normal rate, regular rhythm, normal heart sounds and intact distal pulses.  Pulmonary/Chest: Effort normal and breath sounds normal.  Abdominal: Soft. Bowel sounds are normal. She exhibits no mass. There is no tenderness.  Musculoskeletal: Normal range of motion.  Lymphadenopathy:    She has no cervical adenopathy.  Neurological: She is alert and oriented to person, place, and time.  No tremor  Skin: Skin is warm and dry. No rash noted.  Psychiatric: She  has a normal mood and affect. Her behavior is normal.          Assessment & Plan:  Situational stress Weight loss Essential hypertension well-controlled Dyslipidemia  Will check updated labs including thyroid studies and ESR. Stress management discussed Follow-up 1 month  Marletta Lor

## 2018-03-17 ENCOUNTER — Encounter: Payer: Self-pay | Admitting: Internal Medicine

## 2018-03-17 ENCOUNTER — Ambulatory Visit (INDEPENDENT_AMBULATORY_CARE_PROVIDER_SITE_OTHER): Payer: Self-pay | Admitting: Internal Medicine

## 2018-03-17 VITALS — BP 140/88 | HR 89 | Temp 97.9°F | Wt 90.6 lb

## 2018-03-17 DIAGNOSIS — I10 Essential (primary) hypertension: Secondary | ICD-10-CM

## 2018-03-17 DIAGNOSIS — E785 Hyperlipidemia, unspecified: Secondary | ICD-10-CM

## 2018-03-17 MED ORDER — AMLODIPINE BESYLATE 5 MG PO TABS: 5.0000 mg | ORAL_TABLET | Freq: Every day | ORAL | 4 refills | Status: DC

## 2018-03-17 MED ORDER — ATORVASTATIN CALCIUM 80 MG PO TABS: 80.0000 mg | ORAL_TABLET | Freq: Every day | ORAL | 4 refills | Status: DC

## 2018-03-17 NOTE — Patient Instructions (Signed)
Limit your sodium (Salt) intake  Return in 6 months for follow-up  Increase atorvastatin to 80 mg daily

## 2018-03-17 NOTE — Progress Notes (Signed)
Subjective:    Patient ID: Cheryl [name], female    DOB: 07/24/1961, 57 y.o.   MRN: [id number]  HPI  57 year old patient who is seen today for follow-up of situational stress and weight loss.  Over the past month or so she has improved Laboratory screen fairly unremarkable except for hypercholesterolemia.  She is on atorvastatin 40  Her stress has improved greatly and her weight has improved.  Past Medical History:  Diagnosis Date  . Arthritis   . Bursitis of left hip   . Dysphagia   . Esophagitis   . Fibromuscular dysplasia (HCC)   . GERD (gastroesophageal reflux disease)   . Hemorrhoids   . Hyperlipemia   . Hypertension   . Iron deficiency anemia   . Personal history of colonic polyps    Adenomatous   . Stroke (HCC)   . TIA (transient ischemic attack)      Social History   Socioeconomic History  . Marital status: Married    Spouse name: Not on file  . Number of children: Not on file  . Years of education: Not on file  . Highest education level: Not on file  Occupational History  . Occupation: Charity fundraiser[employer]  Social Needs  . Financial resource strain: Not on file  . Food insecurity:    Worry: Not on file    Inability: Not on file  . Transportation needs:    Medical: Not on file    Non-medical: Not on file  Tobacco Use  . Smoking status: Never Smoker  . Smokeless tobacco: Never Used  Substance and Sexual Activity  . Alcohol use: No  . Drug use: No  . Sexual activity: Not on file  Lifestyle  . Physical activity:    Days per week: Not on file    Minutes per session: Not on file  . Stress: Not on file  Relationships  . Social connections:    Talks on phone: Not on file    Gets together: Not on file    Attends religious service: Not on file    Active member of club or organization: Not on file    Attends meetings of clubs or organizations: Not on file    Relationship status: Not on file  . Intimate partner violence:    Fear of current or ex partner: Not on file     Emotionally abused: Not on file    Physically abused: Not on file    Forced sexual activity: Not on file  Other Topics Concern  . Not on file  Social History Narrative   No regular exercise    Past Surgical History:  Procedure Laterality Date  . CESAREAN SECTION     x 2   . NASOSEPTOPLASTY    . TEMPOROMANDIBULAR JOINT SURGERY  1989    Family History  Problem Relation Age of Onset  . Prostate cancer Father   . Cancer Father        Laryngeal  . Hypertension Mother   . Hyperlipidemia Mother   . Other Mother        varicose veins  . Stroke Mother   . Stroke Maternal Grandmother   . Diabetes Maternal Aunt   . Colon cancer Neg Hx     Allergies  Allergen Reactions  . Cefazolin Hives and Itching  . Sulfonamide Derivatives Hives and Itching    Current Outpatient Medications on File Prior to Visit  Medication Sig Dispense Refill  . amLODipine (NORVASC) 2.5 MG tablet TAKE  2 TABLETS BY MOUTH DAILY 180 tablet 3  . aspirin 325 MG tablet Take 1 tablet (325 mg total) by mouth daily. 30 tablet 5  . atorvastatin (LIPITOR) 40 MG tablet TAKE 1 TABLET BY MOUTH AT BEDTIME 90 tablet 3  . calcium carbonate (TUMS - DOSED IN MG ELEMENTAL CALCIUM) 500 MG chewable tablet Chew 1 tablet by mouth 2 (two) times daily. tAKES 400 MG TWICE A DAILY    . cetirizine (ZYRTEC) 10 MG tablet Take 1 tablet (10 mg total) by mouth as needed for allergies. 90 tablet 3  . ibuprofen (ADVIL,MOTRIN) 200 MG tablet Take 400 mg by mouth every 6 (six) hours as needed for headache or moderate pain.    . Multiple Vitamin (MULTIVITAMIN WITH MINERALS) TABS tablet Take 1 tablet by mouth daily.    . sodium chloride (OCEAN) 0.65 % SOLN nasal spray Place 2 sprays into both nostrils as needed for congestion. 1 Bottle 5   Current Facility-Administered Medications on File Prior to Visit  Medication Dose Route Frequency Provider Last Rate Last Dose  . gadopentetate dimeglumine (MAGNEVIST) injection 9 mL  9 mL Intravenous Once  PRN Micki RileySethi, Pramod S, MD        BP 140/88 (BP Location: Right Arm, Patient Position: Sitting, Cuff Size: Normal)   Pulse 89   Temp 97.9 F (36.6 C) (Oral)   Wt 90 lb 9.6 oz (41.1 kg)   LMP 12/22/2011   SpO2 98%   BMI 17.69 kg/m     Review of Systems  Constitutional: Positive for unexpected weight change.  HENT: Negative for congestion, dental problem, hearing loss, rhinorrhea, sinus pressure, sore throat and tinnitus.   Eyes: Negative for pain, discharge and visual disturbance.  Respiratory: Negative for cough and shortness of breath.   Cardiovascular: Negative for chest pain, palpitations and leg swelling.  Gastrointestinal: Negative for abdominal distention, abdominal pain, blood in stool, constipation, diarrhea, nausea and vomiting.  Genitourinary: Negative for difficulty urinating, dysuria, flank pain, frequency, hematuria, pelvic pain, urgency, vaginal bleeding, vaginal discharge and vaginal pain.  Musculoskeletal: Negative for arthralgias, gait problem and joint swelling.  Skin: Negative for rash.  Neurological: Negative for dizziness, syncope, speech difficulty, weakness, numbness and headaches.  Hematological: Negative for adenopathy.  Psychiatric/Behavioral: Negative for agitation, behavioral problems and dysphoric mood. The patient is nervous/anxious.        Objective:   Physical Exam  Constitutional: She is oriented to person, place, and time. She appears well-developed and well-nourished.  Weight 90.6 Blood pressure well controlled  HENT:  Head: Normocephalic.  Right Ear: External ear normal.  Left Ear: External ear normal.  Mouth/Throat: Oropharynx is clear and moist.  Eyes: Pupils are equal, round, and reactive to light. Conjunctivae and EOM are normal.  Neck: Normal range of motion. Neck supple. No thyromegaly present.  Cardiovascular: Normal rate, regular rhythm, normal heart sounds and intact distal pulses.  Pulmonary/Chest: Effort normal and breath sounds  normal.  Abdominal: Soft. Bowel sounds are normal. She exhibits no mass. There is no tenderness.  Musculoskeletal: Normal range of motion.  Lymphadenopathy:    She has no cervical adenopathy.  Neurological: She is alert and oriented to person, place, and time.  Skin: Skin is warm and dry. No rash noted.  Psychiatric: She has a normal mood and affect. Her behavior is normal.          Assessment & Plan:   Adjustment disorder with anxious mood improved Weight loss stabilized Dyslipidemia.  Will increase atorvastatin to 80 mg  daily  Follow-up 6 months  [facility]

## 2019-09-21 ENCOUNTER — Ambulatory Visit: Payer: Self-pay

## 2019-09-25 ENCOUNTER — Ambulatory Visit: Payer: Self-pay | Attending: Family

## 2019-09-25 DIAGNOSIS — Z23 Encounter for immunization: Secondary | ICD-10-CM | POA: Insufficient documentation

## 2019-10-31 ENCOUNTER — Ambulatory Visit: Payer: Self-pay | Attending: Internal Medicine

## 2019-10-31 DIAGNOSIS — Z23 Encounter for immunization: Secondary | ICD-10-CM

## 2019-10-31 NOTE — Progress Notes (Signed)
   Covid-19 Vaccination Clinic  Name:  Cheryl [name]    MRN: [id number] DOB: 04-30-1961  10/31/2019  [name] was observed post Covid-19 immunization for 15 minutes without incident. She was provided with Vaccine Information Sheet and instruction to access the V-Safe system.   [name] was instructed to call 911 with any severe reactions post vaccine: Marland Kitchen Difficulty breathing  . Swelling of face and throat  . A fast heartbeat  . A bad rash all over body  . Dizziness and weakness   Immunizations Administered    Name Date Dose VIS Date Route   Moderna COVID-19 Vaccine 10/31/2019 10:53 AM 0.5 mL 07/04/2019 Intramuscular   Manufacturer: Moderna   Lot: [id number]   NDC: 87579-728-20

## 2020-07-15 ENCOUNTER — Ambulatory Visit: Payer: Self-pay | Attending: Internal Medicine

## 2020-07-15 DIAGNOSIS — Z23 Encounter for immunization: Secondary | ICD-10-CM

## 2020-07-15 NOTE — Progress Notes (Signed)
   Covid-19 Vaccination Clinic  Name:  JORDY HEWINS    MRN: [id number] DOB: 12/12/1960  07/15/2020  Ms. Gugliotta was observed post Covid-19 immunization for 15 minutes without incident. She was provided with Vaccine Information Sheet and instruction to access the V-Safe system.   Ms. Nold was instructed to call 911 with any severe reactions post vaccine: Marland Kitchen Difficulty breathing  . Swelling of face and throat  . A fast heartbeat  . A bad rash all over body  . Dizziness and weakness   Immunizations Administered    No immunizations on file.

## 2020-07-23 ENCOUNTER — Ambulatory Visit: Payer: Self-pay

## 2020-10-25 ENCOUNTER — Ambulatory Visit: Payer: Self-pay | Admitting: Internal Medicine

## 2020-11-01 ENCOUNTER — Encounter: Payer: Self-pay | Admitting: Internal Medicine

## 2020-11-01 ENCOUNTER — Other Ambulatory Visit: Payer: Self-pay

## 2020-11-01 ENCOUNTER — Ambulatory Visit (INDEPENDENT_AMBULATORY_CARE_PROVIDER_SITE_OTHER): Payer: No Typology Code available for payment source | Admitting: Internal Medicine

## 2020-11-01 VITALS — BP 150/100 | HR 87 | Temp 97.9°F | Ht 60.0 in | Wt 97.5 lb

## 2020-11-01 DIAGNOSIS — Z23 Encounter for immunization: Secondary | ICD-10-CM

## 2020-11-01 DIAGNOSIS — G459 Transient cerebral ischemic attack, unspecified: Secondary | ICD-10-CM

## 2020-11-01 DIAGNOSIS — Z1211 Encounter for screening for malignant neoplasm of colon: Secondary | ICD-10-CM

## 2020-11-01 DIAGNOSIS — I773 Arterial fibromuscular dysplasia: Secondary | ICD-10-CM

## 2020-11-01 DIAGNOSIS — Z124 Encounter for screening for malignant neoplasm of cervix: Secondary | ICD-10-CM

## 2020-11-01 DIAGNOSIS — Z1231 Encounter for screening mammogram for malignant neoplasm of breast: Secondary | ICD-10-CM

## 2020-11-01 DIAGNOSIS — E785 Hyperlipidemia, unspecified: Secondary | ICD-10-CM

## 2020-11-01 DIAGNOSIS — I10 Essential (primary) hypertension: Secondary | ICD-10-CM

## 2020-11-01 LAB — CBC WITH DIFFERENTIAL/PLATELET
Basophils Absolute: 0 10*3/uL (ref 0.0–0.1)
Basophils Relative: 0.5 % (ref 0.0–3.0)
Eosinophils Absolute: 0 10*3/uL (ref 0.0–0.7)
Eosinophils Relative: 0.4 % (ref 0.0–5.0)
HCT: 42 % (ref 36.0–46.0)
Hemoglobin: 13.8 g/dL (ref 12.0–15.0)
Lymphocytes Relative: 16 % (ref 12.0–46.0)
Lymphs Abs: 1.1 10*3/uL (ref 0.7–4.0)
MCHC: 32.8 g/dL (ref 30.0–36.0)
MCV: 86.3 fl (ref 78.0–100.0)
Monocytes Absolute: 0.5 10*3/uL (ref 0.1–1.0)
Monocytes Relative: 6.6 % (ref 3.0–12.0)
Neutro Abs: 5.4 10*3/uL (ref 1.4–7.7)
Neutrophils Relative %: 76.5 % (ref 43.0–77.0)
Platelets: 240 10*3/uL (ref 150.0–400.0)
RBC: 4.87 Mil/uL (ref 3.87–5.11)
RDW: 13.9 % (ref 11.5–15.5)
WBC: 7.1 10*3/uL (ref 4.0–10.5)

## 2020-11-01 LAB — COMPREHENSIVE METABOLIC PANEL
ALT: 15 U/L (ref 0–35)
AST: 24 U/L (ref 0–37)
Albumin: 4.5 g/dL (ref 3.5–5.2)
Alkaline Phosphatase: 77 U/L (ref 39–117)
BUN: 17 mg/dL (ref 6–23)
CO2: 31 mEq/L (ref 19–32)
Calcium: 10.2 mg/dL (ref 8.4–10.5)
Chloride: 103 mEq/L (ref 96–112)
Creatinine, Ser: 0.9 mg/dL (ref 0.40–1.20)
GFR: 69.46 mL/min (ref >60.00)
Glucose, Bld: 92 mg/dL (ref 70–99)
Potassium: 4 mEq/L (ref 3.5–5.1)
Sodium: 142 mEq/L (ref 135–145)
Total Bilirubin: 0.5 mg/dL (ref 0.2–1.2)
Total Protein: 7.1 g/dL (ref 6.0–8.3)

## 2020-11-01 LAB — LIPID PANEL
Cholesterol: 285 mg/dL — ABNORMAL HIGH (ref 0–200)
HDL: 77.4 mg/dL (ref >39.00)
LDL Cholesterol: 195 mg/dL — ABNORMAL HIGH (ref 0–99)
NonHDL: 207.84
Total CHOL/HDL Ratio: 4
Triglycerides: 63 mg/dL (ref 0.0–149.0)
VLDL: 12.6 mg/dL (ref 0.0–40.0)

## 2020-11-01 MED ORDER — ASPIRIN EC 81 MG PO TBEC: 81.0000 mg | DELAYED_RELEASE_TABLET | Freq: Every day | ORAL | 11 refills | Status: AC

## 2020-11-01 MED ORDER — ATORVASTATIN CALCIUM 80 MG PO TABS: 80.0000 mg | ORAL_TABLET | Freq: Every day | ORAL | 1 refills | Status: DC

## 2020-11-01 MED ORDER — AMLODIPINE BESYLATE 5 MG PO TABS: 5.0000 mg | ORAL_TABLET | Freq: Every day | ORAL | 1 refills | Status: DC

## 2020-11-01 NOTE — Patient Instructions (Signed)
-  Nice seeing you today!!  -Lab work today; will notify you once results are available.  -tetanus vaccine today.  -Schedule follow up in 8 weeks for your physical.  -Check Blood pressure 2-3 times a week and bring measurements into your next visit.

## 2020-11-01 NOTE — Progress Notes (Signed)
Established Patient Office Visit     This visit occurred during the SARS-CoV-2 public health emergency.  Safety protocols were in place, including screening questions prior to the visit, additional usage of staff PPE, and extensive cleaning of exam room while observing appropriate contact time as indicated for disinfecting solutions.    CC/Reason for Visit: Establish care, discuss chronic medical conditions  HPI: Cheryl [name] is a 60 y.o. female who is coming in today for the above mentioned reasons. Past Medical History is significant for: Hypertension, hyperlipidemia, history of TIA in 2018 with a history of fibromuscular dysplasia of bilateral carotids.  She is supposed to be on amlodipine 5 mg and atorvastatin 80 mg.  Unfortunately she has not been on these for over a year as she has not had medical care.  She is a Engineer, civil (consulting) by trade.  She is currently not working.  She has no acute complaints.   Past Medical/Surgical History: Past Medical History:  Diagnosis Date  . Arthritis   . Bursitis of left hip   . Dysphagia   . Esophagitis   . Fibromuscular dysplasia (HCC)   . GERD (gastroesophageal reflux disease)   . Hemorrhoids   . Hyperlipemia   . Hypertension   . Iron deficiency anemia   . Personal history of colonic polyps    Adenomatous   . Stroke (HCC)   . TIA (transient ischemic attack)     Past Surgical History:  Procedure Laterality Date  . CESAREAN SECTION     x 2   . NASOSEPTOPLASTY    . TEMPOROMANDIBULAR JOINT SURGERY  1989    Social History:  reports that she has never smoked. She has never used smokeless tobacco. She reports that she does not drink alcohol and does not use drugs.  Allergies: Allergies  Allergen Reactions  . Cefazolin Hives and Itching  . Sulfonamide Derivatives Hives and Itching    Family History:  Family History  Problem Relation Age of Onset  . Prostate cancer Father   . Cancer Father        Laryngeal  . Hypertension Mother    . Hyperlipidemia Mother   . Other Mother        varicose veins  . Stroke Mother   . Stroke Maternal Grandmother   . Diabetes Maternal Aunt   . Colon cancer Neg Hx      Current Outpatient Medications:  .  aspirin EC 81 MG tablet, Take 1 tablet (81 mg total) by mouth daily. Swallow whole., Disp: 30 tablet, Rfl: 11 .  amLODipine (NORVASC) 5 MG tablet, Take 1 tablet (5 mg total) by mouth daily., Disp: 90 tablet, Rfl: 1 .  atorvastatin (LIPITOR) 80 MG tablet, Take 1 tablet (80 mg total) by mouth at bedtime., Disp: 90 tablet, Rfl: 1 No current facility-administered medications for this visit.  Facility-Administered Medications Ordered in Other Visits:  .  gadopentetate dimeglumine (MAGNEVIST) injection 9 mL, 9 mL, Intravenous, Once PRN, Micki Riley, MD  Review of Systems:  Constitutional: Denies fever, chills, diaphoresis, appetite change and fatigue.  HEENT: Denies photophobia, eye pain, redness, hearing loss, ear pain, congestion, sore throat, rhinorrhea, sneezing, mouth sores, trouble swallowing, neck pain, neck stiffness and tinnitus.   Respiratory: Denies SOB, DOE, cough, chest tightness,  and wheezing.   Cardiovascular: Denies chest pain, palpitations and leg swelling.  Gastrointestinal: Denies nausea, vomiting, abdominal pain, diarrhea, constipation, blood in stool and abdominal distention.  Genitourinary: Denies dysuria, urgency, frequency, hematuria, flank pain  and difficulty urinating.  Endocrine: Denies: hot or cold intolerance, sweats, changes in hair or nails, polyuria, polydipsia. Musculoskeletal: Denies myalgias, back pain, joint swelling, arthralgias and gait problem.  Skin: Denies pallor, rash and wound.  Neurological: Denies dizziness, seizures, syncope, weakness, light-headedness, numbness and headaches.  Hematological: Denies adenopathy. Easy bruising, personal or family bleeding history  Psychiatric/Behavioral: Denies suicidal ideation, mood changes, confusion,  nervousness, sleep disturbance and agitation    Physical Exam: Vitals:   11/01/20 0917  BP: (!) 150/100  Pulse: 87  Temp: 97.9 F (36.6 C)  TempSrc: Oral  SpO2: 98%  Weight: 97 lb 8 oz (44.2 kg)  Height: 5' (1.524 m)    Body mass index is 19.04 kg/m.   Constitutional: NAD, calm, comfortable Eyes: PERRL, lids and conjunctivae normal, wears corrective lenses ENMT: Mucous membranes are moist.  Respiratory: clear to auscultation bilaterally, no wheezing, no crackles. Normal respiratory effort. No accessory muscle use.  Cardiovascular: Regular rate and rhythm, no murmurs / rubs / gallops. No extremity edema. 2+ pedal pulses.  Neurologic: Grossly intact and nonfocal Psychiatric: Normal judgment and insight. Alert and oriented x 3. Normal mood.    Impression and Plan:  Screening for malignant neoplasm of colon  - Plan: Ambulatory referral to Gastroenterology  Encounter for screening mammogram for malignant neoplasm of breast  - Plan: MM Digital Screening  Screening for cervical cancer  - Plan: Ambulatory referral to Obstetrics / Gynecology  Dyslipidemia  - Plan: Lipid panel, atorvastatin (LIPITOR) 80 MG tablet  Essential hypertension  - Plan: CBC with Differential/Platelet, Comprehensive metabolic panel, amLODipine (NORVASC) 5 MG tablet -Uncontrolled, resume amlodipine 5 mg daily, return in 8 weeks for follow-up, advised to do ambulatory blood pressure monitoring.  Fibromuscular dysplasia (HCC)  TIA (transient ischemic attack) -Noted, advise she go back on baby aspirin daily also we need to treat her hypertension and hyperlipidemia.  Need for Tdap vaccination -Tdap vaccine administered today.    Patient Instructions  -Nice seeing you today!!  -Lab work today; will notify you once results are available.  -tetanus vaccine today.  -Schedule follow up in 8 weeks for your physical.  -Check Blood pressure 2-3 times a week and bring measurements into your next  visit.     Chaya Jan, MD [facility]

## 2020-11-01 NOTE — Addendum Note (Signed)
Addended by: Kern Reap B on: 11/01/2020 11:48 AM   Modules accepted: Orders

## 2020-11-07 ENCOUNTER — Encounter: Payer: Self-pay | Admitting: Internal Medicine

## 2020-11-13 ENCOUNTER — Telehealth: Payer: Self-pay | Admitting: Internal Medicine

## 2020-11-13 NOTE — Telephone Encounter (Signed)
Attempted to call patient.  Mailbox is full and unable to leave a message.

## 2020-11-13 NOTE — Telephone Encounter (Signed)
Pt call and stated she would like a call back about her labs and want to know about a colonscopy and a mammogram .she want you to call her at [phone].

## 2020-11-20 NOTE — Telephone Encounter (Signed)
2nd attempt Unable to leave a message

## 2020-11-28 NOTE — Telephone Encounter (Signed)
3rd attempt  Unable to leave a message

## 2021-01-03 ENCOUNTER — Encounter: Payer: No Typology Code available for payment source | Admitting: Internal Medicine

## 2021-01-16 LAB — HM MAMMOGRAPHY

## 2021-04-03 ENCOUNTER — Encounter: Payer: Self-pay | Admitting: Internal Medicine

## 2021-04-03 ENCOUNTER — Ambulatory Visit (INDEPENDENT_AMBULATORY_CARE_PROVIDER_SITE_OTHER): Payer: No Typology Code available for payment source | Admitting: Internal Medicine

## 2021-04-03 ENCOUNTER — Other Ambulatory Visit: Payer: Self-pay

## 2021-04-03 VITALS — BP 130/94 | HR 74 | Temp 97.9°F | Ht 60.25 in | Wt 93.4 lb

## 2021-04-03 DIAGNOSIS — Z1211 Encounter for screening for malignant neoplasm of colon: Secondary | ICD-10-CM

## 2021-04-03 DIAGNOSIS — E785 Hyperlipidemia, unspecified: Secondary | ICD-10-CM | POA: Diagnosis not present

## 2021-04-03 DIAGNOSIS — G459 Transient cerebral ischemic attack, unspecified: Secondary | ICD-10-CM | POA: Diagnosis not present

## 2021-04-03 DIAGNOSIS — I773 Arterial fibromuscular dysplasia: Secondary | ICD-10-CM | POA: Diagnosis not present

## 2021-04-03 DIAGNOSIS — Z23 Encounter for immunization: Secondary | ICD-10-CM | POA: Diagnosis not present

## 2021-04-03 DIAGNOSIS — I10 Essential (primary) hypertension: Secondary | ICD-10-CM

## 2021-04-03 DIAGNOSIS — Z0000 Encounter for general adult medical examination without abnormal findings: Secondary | ICD-10-CM | POA: Diagnosis not present

## 2021-04-03 LAB — HEMOGLOBIN A1C: Hgb A1c MFr Bld: 6 % (ref 4.6–6.5)

## 2021-04-03 LAB — LIPID PANEL
Cholesterol: 294 mg/dL — ABNORMAL HIGH (ref 0–200)
HDL: 84.3 mg/dL (ref >39.00)
LDL Cholesterol: 197 mg/dL — ABNORMAL HIGH (ref 0–99)
NonHDL: 209.65
Total CHOL/HDL Ratio: 3
Triglycerides: 65 mg/dL (ref 0.0–149.0)
VLDL: 13 mg/dL (ref 0.0–40.0)

## 2021-04-03 LAB — COMPREHENSIVE METABOLIC PANEL
ALT: 25 U/L (ref 0–35)
AST: 32 U/L (ref 0–37)
Albumin: 4.8 g/dL (ref 3.5–5.2)
Alkaline Phosphatase: 74 U/L (ref 39–117)
BUN: 14 mg/dL (ref 6–23)
CO2: 31 mEq/L (ref 19–32)
Calcium: 10.1 mg/dL (ref 8.4–10.5)
Chloride: 103 mEq/L (ref 96–112)
Creatinine, Ser: 0.84 mg/dL (ref 0.40–1.20)
GFR: 75.23 mL/min (ref >60.00)
Glucose, Bld: 90 mg/dL (ref 70–99)
Potassium: 4 mEq/L (ref 3.5–5.1)
Sodium: 142 mEq/L (ref 135–145)
Total Bilirubin: 0.4 mg/dL (ref 0.2–1.2)
Total Protein: 7.4 g/dL (ref 6.0–8.3)

## 2021-04-03 LAB — CBC WITH DIFFERENTIAL/PLATELET
Basophils Absolute: 0 10*3/uL (ref 0.0–0.1)
Basophils Relative: 0.6 % (ref 0.0–3.0)
Eosinophils Absolute: 0 10*3/uL (ref 0.0–0.7)
Eosinophils Relative: 0.7 % (ref 0.0–5.0)
HCT: 42.1 % (ref 36.0–46.0)
Hemoglobin: 13.8 g/dL (ref 12.0–15.0)
Lymphocytes Relative: 19.2 % (ref 12.0–46.0)
Lymphs Abs: 1 10*3/uL (ref 0.7–4.0)
MCHC: 32.7 g/dL (ref 30.0–36.0)
MCV: 86.7 fl (ref 78.0–100.0)
Monocytes Absolute: 0.5 10*3/uL (ref 0.1–1.0)
Monocytes Relative: 8.7 % (ref 3.0–12.0)
Neutro Abs: 3.7 10*3/uL (ref 1.4–7.7)
Neutrophils Relative %: 70.8 % (ref 43.0–77.0)
Platelets: 235 10*3/uL (ref 150.0–400.0)
RBC: 4.86 Mil/uL (ref 3.87–5.11)
RDW: 13.6 % (ref 11.5–15.5)
WBC: 5.3 10*3/uL (ref 4.0–10.5)

## 2021-04-03 LAB — TSH: TSH: 1.03 u[IU]/mL (ref 0.35–5.50)

## 2021-04-03 LAB — VITAMIN D 25 HYDROXY (VIT D DEFICIENCY, FRACTURES): VITD: 20.1 ng/mL — ABNORMAL LOW (ref 30.00–100.00)

## 2021-04-03 LAB — VITAMIN B12: Vitamin B-12: 258 pg/mL (ref 211–911)

## 2021-04-03 MED ORDER — AMLODIPINE BESYLATE 5 MG PO TABS: 5.0000 mg | ORAL_TABLET | Freq: Every day | ORAL | 1 refills | Status: DC

## 2021-04-03 MED ORDER — ATORVASTATIN CALCIUM 80 MG PO TABS: 80.0000 mg | ORAL_TABLET | Freq: Every day | ORAL | 1 refills | Status: DC

## 2021-04-03 NOTE — Patient Instructions (Signed)
-  Nice seeing you today!!  -Lab work today; will notify you once results are available.  -Flu and first shingles vaccine today.  -Remember to get your second booster.  -Colonoscopy referral made.  -Make sure you are taking amlodipine 5 mg daily and atorvastatin 80 mg daily.  -Schedule follow up in 3 months.

## 2021-04-03 NOTE — Progress Notes (Signed)
Established Patient Office Visit     This visit occurred during the SARS-CoV-2 public health emergency.  Safety protocols were in place, including screening questions prior to the visit, additional usage of staff PPE, and extensive cleaning of exam room while observing appropriate contact time as indicated for disinfecting solutions.    CC/Reason for Visit: Annual preventive exam and follow-up chronic medical conditions  HPI: Cheryl [name] is a 60 y.o. female who is coming in today for the above mentioned reasons. Past Medical History is significant for:  Hypertension, hyperlipidemia, history of TIA in 2018 with a history of fibromuscular dysplasia of bilateral carotids.  I first met her in April.  At that time her blood pressure was 150/100 and her LDL cholesterol was 195.  She was supposed to be on amlodipine and atorvastatin but had not been taking.  She again tells me today that she is nonadherent to medical therapy.  She did have a Pap smear and a mammogram although I do not have that report on file but she will obtain for me.  She is also overdue for her initial screening colonoscopy.  She is due for eye and dental care.  She is due for her second COVID booster, shingles and flu vaccines.  She has no acute concerns or complaints.  She does not understand why she has to take medications if she does not feel sick.  She has not had a follow-up carotid Doppler since 2018.  Past Medical/Surgical History: Past Medical History:  Diagnosis Date   Arthritis    Bursitis of left hip    Dysphagia    Esophagitis    Fibromuscular dysplasia (HCC)    GERD (gastroesophageal reflux disease)    Hemorrhoids    Hyperlipemia    Hypertension    Iron deficiency anemia    Personal history of colonic polyps    Adenomatous    Stroke ([place])    TIA (transient ischemic attack)     Past Surgical History:  Procedure Laterality Date   CESAREAN SECTION     x 2    NASOSEPTOPLASTY     TEMPOROMANDIBULAR  JOINT SURGERY  1989    Social History:  reports that she has never smoked. She has never used smokeless tobacco. She reports that she does not drink alcohol and does not use drugs.  Allergies: Allergies  Allergen Reactions   Cefazolin Hives and Itching   Sulfonamide Derivatives Hives and Itching    Family History:  Family History  Problem Relation Age of Onset   Prostate cancer Father    Cancer Father        Laryngeal   Hypertension Mother    Hyperlipidemia Mother    Other Mother        varicose veins   Stroke Mother    Stroke Maternal Grandmother    Diabetes Maternal Aunt    Colon cancer Neg Hx      Current Outpatient Medications:    aspirin EC 81 MG tablet, Take 1 tablet (81 mg total) by mouth daily. Swallow whole., Disp: 30 tablet, Rfl: 11   amLODipine (NORVASC) 5 MG tablet, Take 1 tablet (5 mg total) by mouth daily., Disp: 90 tablet, Rfl: 1   atorvastatin (LIPITOR) 80 MG tablet, Take 1 tablet (80 mg total) by mouth at bedtime., Disp: 90 tablet, Rfl: 1 No current facility-administered medications for this visit.  Facility-Administered Medications Ordered in Other Visits:    gadopentetate dimeglumine (MAGNEVIST) injection 9 mL, 9 mL, Intravenous,  Once PRN, Garvin Fila, MD  Review of Systems:  Constitutional: Denies fever, chills, diaphoresis, appetite change and fatigue.  HEENT: Denies photophobia, eye pain, redness, hearing loss, ear pain, congestion, sore throat, rhinorrhea, sneezing, mouth sores, trouble swallowing, neck pain, neck stiffness and tinnitus.   Respiratory: Denies SOB, DOE, cough, chest tightness,  and wheezing.   Cardiovascular: Denies chest pain, palpitations and leg swelling.  Gastrointestinal: Denies nausea, vomiting, abdominal pain, diarrhea, constipation, blood in stool and abdominal distention.  Genitourinary: Denies dysuria, urgency, frequency, hematuria, flank pain and difficulty urinating.  Endocrine: Denies: hot or cold intolerance,  sweats, changes in hair or nails, polyuria, polydipsia. Musculoskeletal: Denies myalgias, back pain, joint swelling, arthralgias and gait problem.  Skin: Denies pallor, rash and wound.  Neurological: Denies dizziness, seizures, syncope, weakness, light-headedness, numbness and headaches.  Hematological: Denies adenopathy. Easy bruising, personal or family bleeding history  Psychiatric/Behavioral: Denies suicidal ideation, mood changes, confusion, nervousness, sleep disturbance and agitation    Physical Exam: Vitals:   04/03/21 0937  BP: (!) 130/94  Pulse: 74  Temp: 97.9 F (36.6 C)  TempSrc: Oral  SpO2: 97%  Weight: 93 lb 6.4 oz (42.4 kg)  Height: 5' 0.25" (1.53 m)    Body mass index is 18.09 kg/m.   Constitutional: NAD, calm, comfortable Eyes: PERRL, lids and conjunctivae normal, wears corrective lenses ENMT: Mucous membranes are moist. Posterior pharynx clear of any exudate or lesions. Normal dentition. Tympanic membrane is pearly white, no erythema or bulging. Neck: normal, supple, no masses, no thyromegaly Respiratory: clear to auscultation bilaterally, no wheezing, no crackles. Normal respiratory effort. No accessory muscle use.  Cardiovascular: Regular rate and rhythm, no murmurs / rubs / gallops. No extremity edema. 2+ pedal pulses. No carotid bruits.  Abdomen: no tenderness, no masses palpated. No hepatosplenomegaly. Bowel sounds positive.  Musculoskeletal: no clubbing / cyanosis. No joint deformity upper and lower extremities. Good ROM, no contractures. Normal muscle tone.  Skin: no rashes, lesions, ulcers. No induration Neurologic: CN 2-12 grossly intact. Sensation intact, DTR normal. Strength 5/5 in all 4.  Psychiatric: Normal judgment and insight. Alert and oriented x 3. Normal mood.    Impression and Plan:  Encounter for preventive health examination -Advised routine eye and dental care. -Flu and shingles vaccine today, she will get her second [place] booster at  [facility] will be updated today. -Healthy lifestyle discussed in detail. -She will be referred to GI as she is well overdue for her initial screening colonoscopy. -She will obtain records of her recent Pap smear and mammogram to update her chart.  Essential hypertension  Dyslipidemia  - Plan: Lipid panel, atorvastatin (LIPITOR) 80 MG tablet, Lipid panel -She has been educated extensively on the importance of blood pressure and lipid management especially given her prior history of TIA and fibromuscular dysplasia of the carotid arteries. -I am not entirely clear that she understands the importance of being adherent. -She will follow-up with me in 3 months.  TIA (transient ischemic attack) -Prior, noted  Fibromuscular dysplasia ([place])  - Plan: US Carotid Duplex Bilateral  Need for influenza vaccination -Flu vaccine administered today.  Need for shingles vaccine -For shingles vaccine administered today.    Patient Instructions  -Nice seeing you today!!  -Lab work today; will notify you once results are available.  -Flu and first shingles vaccine today.  -Remember to get your second booster.  -Colonoscopy referral made.  -Make sure you are taking amlodipine 5 mg daily and atorvastatin 80 mg daily.  -Schedule follow  up in 3 months.   Lelon Frohlich, MD [facility] at [facility]

## 2021-04-03 NOTE — Addendum Note (Signed)
Addended by: Kern Reap B on: 04/03/2021 12:18 PM   Modules accepted: Orders

## 2021-04-04 ENCOUNTER — Encounter: Payer: Self-pay | Admitting: Internal Medicine

## 2021-04-04 ENCOUNTER — Other Ambulatory Visit: Payer: Self-pay | Admitting: Internal Medicine

## 2021-04-04 DIAGNOSIS — E559 Vitamin D deficiency, unspecified: Secondary | ICD-10-CM

## 2021-04-04 DIAGNOSIS — R7302 Impaired glucose tolerance (oral): Secondary | ICD-10-CM | POA: Insufficient documentation

## 2021-04-04 MED ORDER — VITAMIN D (ERGOCALCIFEROL) 1.25 MG (50000 UNIT) PO CAPS: 50000.0000 [IU] | ORAL_CAPSULE | ORAL | 0 refills | Status: AC

## 2021-04-09 ENCOUNTER — Other Ambulatory Visit: Payer: Self-pay | Admitting: Internal Medicine

## 2021-04-09 DIAGNOSIS — E559 Vitamin D deficiency, unspecified: Secondary | ICD-10-CM

## 2021-05-27 ENCOUNTER — Encounter: Payer: Self-pay | Admitting: Internal Medicine

## 2021-08-14 ENCOUNTER — Other Ambulatory Visit: Payer: Self-pay

## 2021-08-14 DIAGNOSIS — G459 Transient cerebral ischemic attack, unspecified: Secondary | ICD-10-CM

## 2021-08-14 DIAGNOSIS — I773 Arterial fibromuscular dysplasia: Secondary | ICD-10-CM

## 2021-08-14 NOTE — Progress Notes (Signed)
Pt's mom called in requested a new referral to neurology in the [facility]. Referral sent to [facility].

## 2021-10-13 ENCOUNTER — Ambulatory Visit: Payer: No Typology Code available for payment source | Admitting: Internal Medicine

## 2021-10-14 ENCOUNTER — Ambulatory Visit: Payer: No Typology Code available for payment source | Admitting: Internal Medicine

## 2021-10-14 VITALS — BP 150/98 | HR 70 | Temp 98.3°F | Wt 90.5 lb

## 2021-10-14 DIAGNOSIS — I10 Essential (primary) hypertension: Secondary | ICD-10-CM

## 2021-10-14 DIAGNOSIS — E785 Hyperlipidemia, unspecified: Secondary | ICD-10-CM | POA: Diagnosis not present

## 2021-10-14 DIAGNOSIS — Z1211 Encounter for screening for malignant neoplasm of colon: Secondary | ICD-10-CM | POA: Diagnosis not present

## 2021-10-14 DIAGNOSIS — I6523 Occlusion and stenosis of bilateral carotid arteries: Secondary | ICD-10-CM

## 2021-10-14 DIAGNOSIS — R7302 Impaired glucose tolerance (oral): Secondary | ICD-10-CM

## 2021-10-14 DIAGNOSIS — Z23 Encounter for immunization: Secondary | ICD-10-CM

## 2021-10-14 DIAGNOSIS — R413 Other amnesia: Secondary | ICD-10-CM | POA: Diagnosis not present

## 2021-10-14 MED ORDER — AMLODIPINE BESYLATE 5 MG PO TABS: 5.0000 mg | ORAL_TABLET | Freq: Every day | ORAL | 1 refills | Status: DC

## 2021-10-14 MED ORDER — ATORVASTATIN CALCIUM 80 MG PO TABS: 80.0000 mg | ORAL_TABLET | Freq: Every day | ORAL | 1 refills | Status: DC

## 2021-10-14 NOTE — Patient Instructions (Signed)
-  Nice seeing you today!! ? ?-Start amlodipine 5 mg daily for blood pressure. ? ?-Start atorvastatin 80 mg daily for cholesterol. ? ?-Second shingles vaccine today. ? ?-Schedule follow up in 3 months. ?

## 2021-10-14 NOTE — Progress Notes (Signed)
? ? ? ?Established Patient Office Visit ? ? ? ? ?This visit occurred during the SARS-CoV-2 public health emergency.  Safety protocols were in place, including screening questions prior to the visit, additional usage of staff PPE, and extensive cleaning of exam room while observing appropriate contact time as indicated for disinfecting solutions.  ? ? ?CC/Reason for Visit: Follow-up chronic conditions ? ?HPI: Cheryl [name] is a 61 y.o. female who is coming in today for the above mentioned reasons. Past Medical History is significant for: Hypertension, hyperlipidemia, prior TIA, vitamin D deficiency, impaired glucose tolerance and bilateral carotid artery stenosis due to fibromuscular dysplasia without follow-up since 2018.  She is here today with her mother and her husband who are very concerned about her.  She is known to be nonadherent to medication despite serious medical concerns.  She used to be a Research scientist (physical sciences)healthcare worker.  She has been having severe issues with short-term memory.  Her husband describes increased hoarding, strange behaviors like locking the bedroom door at all times of day, hiding the remotes for all the TVs in the house in one specific location usually behind her bedroom pillow.  He will sometimes prepare food for her but she forgets to eat it.  He states that these issues have been progressing over the course of several years.  They have 2 children who are now grown and out of the house but at 1 point she forgot how to get to her son's school to pick him up from a practice.  He also describes how she had to quit her 35 years working for [employer] because of difficulty keeping up with charting.  He reports that towards the end of her years working in healthcare she would spend several hours after her shift had ended trying to keep up with her charting.  She was unable to hold any jobs after that, he reports she must of cycled through 3 or 4 but was terminated after only a few weeks each  time.  Patient seems very upset with her husband and her mother today.  She states that they are all lying.  She is very argumentative and using foul language.  She is due for her second shingles vaccine and her COVID booster. ? ? ?Past Medical/Surgical History: ?Past Medical History:  ?Diagnosis Date  ? Arthritis   ? Bursitis of left hip   ? Dysphagia   ? Esophagitis   ? Fibromuscular dysplasia (HCC)   ? GERD (gastroesophageal reflux disease)   ? Hemorrhoids   ? Hyperlipemia   ? Hypertension   ? Iron deficiency anemia   ? Personal history of colonic polyps   ? Adenomatous   ? Stroke [facility](HCC)   ? TIA (transient ischemic attack)   ? ? ?Past Surgical History:  ?Procedure Laterality Date  ? CESAREAN SECTION    ? x 2   ? NASOSEPTOPLASTY    ? TEMPOROMANDIBULAR JOINT SURGERY  1989  ? ? ?Social History: ? reports that she has never smoked. She has never used smokeless tobacco. She reports that she does not drink alcohol and does not use drugs. ? ?Allergies: ?Allergies  ?Allergen Reactions  ? Cefazolin Hives and Itching  ? Sulfonamide Derivatives Hives and Itching  ? ? ?Family History:  ?Family History  ?Problem Relation Age of Onset  ? Prostate cancer Father   ? Cancer Father   ?     Laryngeal  ? Hypertension Mother   ? Hyperlipidemia Mother   ? Other Mother   ?  varicose veins  ? Stroke Mother   ? Stroke Maternal Grandmother   ? Diabetes Maternal Aunt   ? Colon cancer Neg Hx   ? ? ? ?Current Outpatient Medications:  ?  aspirin EC 81 MG tablet, Take 1 tablet (81 mg total) by mouth daily. Swallow whole., Disp: 30 tablet, Rfl: 11 ?  amLODipine (NORVASC) 5 MG tablet, Take 1 tablet (5 mg total) by mouth daily., Disp: 90 tablet, Rfl: 1 ?  atorvastatin (LIPITOR) 80 MG tablet, Take 1 tablet (80 mg total) by mouth at bedtime., Disp: 90 tablet, Rfl: 1 ?No current facility-administered medications for this visit. ? ?Facility-Administered Medications Ordered in Other Visits:  ?  gadopentetate dimeglumine (MAGNEVIST) injection 9 mL, 9  mL, Intravenous, Once PRN, Micki Riley, MD ? ?Review of Systems:  ?Constitutional: Denies fever, chills, diaphoresis, appetite change and fatigue.  ?HEENT: Denies photophobia, eye pain, redness, hearing loss, ear pain, congestion, sore throat, rhinorrhea, sneezing, mouth sores, trouble swallowing, neck pain, neck stiffness and tinnitus.   ?Respiratory: Denies SOB, DOE, cough, chest tightness,  and wheezing.   ?Cardiovascular: Denies chest pain, palpitations and leg swelling.  ?Gastrointestinal: Denies nausea, vomiting, abdominal pain, diarrhea, constipation, blood in stool and abdominal distention.  ?Genitourinary: Denies dysuria, urgency, frequency, hematuria, flank pain and difficulty urinating.  ?Endocrine: Denies: hot or cold intolerance, sweats, changes in hair or nails, polyuria, polydipsia. ?Musculoskeletal: Denies myalgias, back pain, joint swelling, arthralgias and gait problem.  ?Skin: Denies pallor, rash and wound.  ?Neurological: Denies dizziness, seizures, syncope, weakness, light-headedness, numbness and headaches.  ?Hematological: Denies adenopathy. Easy bruising, personal or family bleeding history  ?Psychiatric/Behavioral: Denies suicidal ideation, mood changes, confusion, nervousness, sleep disturbance and agitation ? ? ? ?Physical Exam: ?Vitals:  ? 10/14/21 1031  ?BP: (!) 150/98  ?Pulse: 70  ?Temp: 98.3 ?F (36.8 ?C)  ?TempSrc: Oral  ?SpO2: 98%  ?Weight: 90 lb 8 oz (41.1 kg)  ? ? ?Body mass index is 17.53 kg/m?. ? ? ?Constitutional: NAD, calm, comfortable ?Eyes: PERRL, lids and conjunctivae normal, wears corrective lenses ?ENMT: Mucous membranes are moist.  ?Respiratory: clear to auscultation bilaterally, no wheezing, no crackles. Normal respiratory effort. No accessory muscle use.  ?Cardiovascular: Regular rate and rhythm, no murmurs / rubs / gallops. No extremity edema.  ?Psychiatric: Poor judgment and insight. Alert and oriented x 3.  Mood appears anxious. ? ? ?Impression and Plan: ? ?Memory  loss  ?- Plan: Ambulatory referral to Neurology ?-She has very strong cognitive deficits that are apparent after speaking with her for only a few short minutes, she repeats the same things over and over again, this coupled with reports from close family members are concerning.  With longstanding familial hyperlipidemia and uncontrolled hypertension, have to consider vascular dementia, but given her young age I believe it would be important to also rule out causes of early dementia.  These memory issues have apparently been longstanding, tracking back per family reports to at least 15 to 20 years ago. ? ?Screening for malignant neoplasm of colon  ?- Plan: Ambulatory referral to Gastroenterology ? ?Essential hypertension  ?- Plan: amLODipine (NORVASC) 5 MG tablet ?-Uncontrolled due to medication nonadherence ? ?Dyslipidemia  ?- Plan: atorvastatin (LIPITOR) 80 MG tablet ?-Last lipid panel in September 2022 with a total cholesterol of 294, LDL 197 and triglycerides 65. ?-Husband will assist in making sure that she takes her medications every day.  Important to note that there is a strong component of familial hyperlipidemia.  Mother is on evolocumab.  Consider  referring to lipid clinic if lipids remain above goal after 3 months of consistently taking maximum dose statin. ? ?Bilateral carotid artery stenosis ?-I have requested repeated carotid Dopplers. ? ?IGT (impaired glucose tolerance) ?-A1c was 6 in September. ? ?Need for shingles vaccine ? - Plan: Varicella-zoster vaccine IM (Shingrix) ? ?Time spent: 43 minutes reviewing chart, interviewing patient and family members, examining patient and formulating plan of care. ? ? ?Patient Instructions  ?-Nice seeing you today!! ? ?-Start amlodipine 5 mg daily for blood pressure. ? ?-Start atorvastatin 80 mg daily for cholesterol. ? ?-Second shingles vaccine today. ? ?-Schedule follow up in 3 months. ? ? ? ?Chaya Jan, MD ?[facility] ? ? ?

## 2021-10-15 ENCOUNTER — Ambulatory Visit: Payer: No Typology Code available for payment source | Admitting: Physician Assistant

## 2021-10-15 ENCOUNTER — Other Ambulatory Visit: Payer: Self-pay

## 2021-10-15 ENCOUNTER — Other Ambulatory Visit (INDEPENDENT_AMBULATORY_CARE_PROVIDER_SITE_OTHER): Payer: No Typology Code available for payment source

## 2021-10-15 ENCOUNTER — Encounter: Payer: Self-pay | Admitting: Physician Assistant

## 2021-10-15 VITALS — BP 146/82 | HR 90 | Resp 18 | Ht <= 58 in | Wt 90.0 lb

## 2021-10-15 DIAGNOSIS — F69 Unspecified disorder of adult personality and behavior: Secondary | ICD-10-CM | POA: Diagnosis not present

## 2021-10-15 DIAGNOSIS — R413 Other amnesia: Secondary | ICD-10-CM

## 2021-10-15 LAB — VITAMIN D 25 HYDROXY (VIT D DEFICIENCY, FRACTURES): VITD: 21.33 ng/mL — ABNORMAL LOW (ref 30.00–100.00)

## 2021-10-15 LAB — VITAMIN B12: Vitamin B-12: 413 pg/mL (ref 211–911)

## 2021-10-15 LAB — TSH: TSH: 0.86 u[IU]/mL (ref 0.35–5.50)

## 2021-10-15 NOTE — Progress Notes (Addendum)
? ? ?Assessment/Plan:  ? 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 MoCA and MMSE were unable to be completed answers were very tangential, with inability to follow verbal instructions.  She has very repetitive behaviors and answers.  She had an evaluation in April 2017 at [facility] with same complaints, yielding a MoCA of 25/30 at the time, with an MRI with normal results.  It was felt that her memory difficulties were due to stress.  Patient denies having any memory issues.  Records show that her memory difficulties were documented dating back to 20 years prior, thus not suggestive of Alzheimer's dementia.  There may be a vascular component, given her history of TIAs and carotid artery stenosis fibromuscular dysplasia as above. ? ? Recommendations:  ? ?Memory difficulties, unclear etiology ? ? ?MRI brain with/without contrast to assess for underlying structural abnormality and assess vascular load  ?Check B12, TSH, vitamin D ?Referral to psychiatry for evaluation of underlying psychiatric issues that may explain her memory difficulties ?Follow-up pending on the results of the MRI of the brain. ?No antidementia medication is indicated at this time. ?Discussed safety both in and out of the home.  ?Discussed the importance of regular daily schedule with inclusion of crossword puzzles to maintain brain function.  ?Continue to monitor mood with PCP.  ?Stay active at least 30 minutes at least 3 times a week.  ?Naps should be scheduled and should be no longer than 60 minutes and should not occur after 2 PM.  ?Control cardiovascular risk factors  ?Mediterranean diet is recommended  ? ? ?Subjective:  ? ? ?The patient is seen in neurologic consultation at  the request of Leotis Shames* for the evaluation of memory.  The patient is accompanied by her husband who supplements the history. The patient denies any memory issues, stating that her husband is the one that complains about it, but he is states the whole day outside, and only comes to sleep "he sleeps in the couch anyway ".  Of note, she had been seen by [facility] in 2017 for memory issues, followed until 2019, and it was felt that her memory difficulties were due to stress and it was recommended that she continued participation in activities for stress relaxation like meditation, yoga or regular exercises.  MoCA at the time was 25/30.  MRI of the brain with and without contrast on April 2019 was normal. ?She repeats herself constantly during the whole visit, especially about her marital issues.  She denies being disoriented when walking into her room, although per chart report, there is report of increased hoarding, and strange behaviors "like looking at the bedroom door at all times of the day, hiding objects especially the remote control ".  Per chart report, "at one point she forgot how to get to her son's school to picking up from the practice ".  Since then, she only drives with a copilot, especially her mother.  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  "At the time, she would spend several hours after her shift ending, trying to keep up with her charting, and had 3 or 4 different jobs after that, terminated after a few weeks at each time.  ". ?  Her husband described increased hoarding.  She ambulates without difficulties, denies wandering behavior is stating that she likes to stay at home all day long, needing, or just staying in her bedroom.  She denies any mood issues, such as depression, or irritability.  She has never been evaluated by psychiatry or behavioral therapy.  She states that she sleeps well, but she does not like her husband  in the room because he "pushes and I do not like him being there ".  She denies sleepwalking or vivid dreams, hallucinations or paranoia.  She denies any hygiene concerns, she is independent of bathing and dressing.  Her husband manages the medications, cooking and the finances.  She denies any headaches, double vision, dizziness, focal numbness or tingling, unilateral weakness, tremors or anosmia. No history of seizures. Denies urine incontinence, retention, constipation or diarrhea.  Denies OSA, ETOH or Tobacco.  No family history of dementia.  Of note, during the whole visit, after answering all the above questions, she follows her answers with complaints about her husband. ? ?Dementia panel was normal on 11/03/2016  ?EEG normal ?MoCA on 10/29/2016 was 25/30 with delayed recall 0/5, geriatric depression scale 5 (borderline for depression).  ? ? ?Labs on 04/03/2021 TSH 1.03, B12 258, vitamin D low 20.1 ? ? ?Ct Head Wo Contrast ?08/20/2015  No intracranial mass, hemorrhage, or focal gray -white compartment lesions/acute appearing infarct.  ?  ?Mr Brain Wo Contrast ?08/20/2015  1. No acute intracranial abnormality.  ?  ?Mr Angiogram Head Wo Contrast ?08/20/2015   Negative head MRA.  ?  ?Mr Angiogram Neck W Wo Contrast ?08/20/2015  Diffuse changes of fibromuscular dysplasia involving the cervical internal carotid arteries and vertebral arteries, mildly progressed from prior. Up to 60% stenosis of the right ICA.  ?  ?2D Echocardiogram  ?- Left ventricle: The cavity size was normal. Wall thickness was normal. Systolic function was normal. The estimated ejection fraction was in the range of 55% to 60%. Wall motion was normal; there were no regional wall motion abnormalities. Doppler parameters are consistent with abnormal left ventricular relaxation (grade 1 diastolic dysfunction). ?- Impressions:  Normal LV systolic function; grade 1 diastolic dysfunction; trace MR and TR.  ? ? ?Allergies  ?Allergen Reactions  ? Cefazolin Hives  and Itching  ? Sulfonamide Derivatives Hives and Itching  ? ? ?Current Outpatient Medications  ?Medication Instructions  ? amLODipine (NORVASC) 5 mg, Oral, Daily  ? aspirin EC 81 mg, Oral, Daily, Swallow whole.  ? atorvastatin (LIPITOR) 80 mg, Oral, Daily at bedtime  ? ? ? ?VITALS:  There were no vitals filed for this visit. ?Depression screen [facility] 2/9 10/14/2021 04/03/2021 11/01/2020  ?Decreased Interest 2 0 0  ?Down, Depressed, Hopeless 1 0 0  ?PHQ - 2 Score 3 0 0  ?Altered sleeping 2 0 1  ?Tired, decreased energy 1 0 1  ?Change in appetite 2 0 0  ?Feeling bad or failure about yourself  1 0 0  ?Trouble concentrating 2 0 0  ?Moving slowly or fidgety/restless 3 0 0  ?Suicidal thoughts 0 0 0  ?PHQ-9 Score 14 0 2  ?Difficult doing work/chores - Not difficult at all Not difficult at all  ? ? ?PHYSICAL EXAM  ? ?HEENT:  Normocephalic, atraumatic. The mucous membranes are moist. The superficial temporal arteries are without ropiness or tenderness. ?Cardiovascular: Regular rate and rhythm. ?Lungs: Clear to auscultation bilaterally. ?Neck: There are no carotid bruits noted bilaterally. ? ?NEUROLOGICAL: ?Montreal Cognitive Assessment  10/29/2016  ?Visuospatial/ Executive (  0/5) 5  ?Naming (0/3) 3  ?Attention: Read list of digits (0/2) 2  ?Attention: Read list of letters (0/1) 1  ?Attention: Serial 7 subtraction starting at 100 (0/3) 1  ?Language: Repeat phrase (0/2) 2  ?Language : Fluency (0/1) 1  ?Abstraction (0/2) 2  ?Delayed Recall (0/5) 2  ?Orientation (0/6) 6  ?Total 25  ? ?No flowsheet data found.  ?No flowsheet data found.  ? ?Orientation:  Alert and oriented to person, place and time. No aphasia or dysarthria. Fund of knowledge is unable to be tested, the patient is very tangential. Recent memory and remote memory appear intact.  Attention and concentration are reduced.  Unable to name objects and repeat phrases. Delayed recall unable to be tested ?Cranial nerves: There is good facial symmetry. Extraocular muscles are intact  and visual fields are full to confrontational testing. Speech is tangential, but clear. soft palate rises symmetrically and there is no tongue deviation. Hearing is intact to conversational tone. ?T

## 2021-10-15 NOTE — Patient Instructions (Addendum)
It was a pleasure to see you today at our office.  ? ?Recommendations: ? ? ?MRI of the brain, the radiology office will call you to arrange you appointment ?Check labs today ?Follow up pending on the results ? ?RECOMMENDATIONS FOR ALL PATIENTS WITH MEMORY PROBLEMS: ?1. Continue to exercise (Recommend 30 minutes of walking everyday, or 3 hours every week) ?2. Increase social interactions - continue going to [place] and enjoy social gatherings with friends and family ?3. Eat healthy, avoid fried foods and eat more fruits and vegetables ?4. Maintain adequate blood pressure, blood sugar, and blood cholesterol level. Reducing the risk of stroke and cardiovascular disease also helps promoting better memory. ?5. Avoid stressful situations. Live a simple life and avoid aggravations. Organize your time and prepare for the next day in anticipation. ?6. Sleep well, avoid any interruptions of sleep and avoid any distractions in the bedroom that may interfere with adequate sleep quality ?7. Avoid sugar, avoid sweets as there is a strong link between excessive sugar intake, diabetes, and cognitive impairment ?We discussed the Mediterranean diet, which has been shown to help patients reduce the risk of progressive memory disorders and reduces cardiovascular risk. This includes eating fish, eat fruits and green leafy vegetables, nuts like almonds and hazelnuts, walnuts, and also use olive oil. Avoid fast foods and fried foods as much as possible. Avoid sweets and sugar as sugar use has been linked to worsening of memory function. ? ?There is always a concern of gradual progression of memory problems. If this is the case, then we may need to adjust level of care according to patient needs. Support, both to the patient and caregiver, should then be put into place.  ? ? ? ? ?You have been referred for a neuropsychological evaluation (i.e., evaluation of memory and thinking abilities). Please bring someone with you to this appointment  if possible, as it is helpful for the doctor to hear from both you and another adult who knows you well. Please bring eyeglasses and hearing aids if you wear them.  ?  ?The evaluation will take approximately 3 hours and has two parts: ?  ?The first part is a clinical interview with the neuropsychologist (Dr. Milbert Coulter or Dr. Roseanne Reno). During the interview, the neuropsychologist will speak with you and the individual you brought to the appointment.  ?  ?The second part of the evaluation is testing with the doctor's technician Annabelle Harman or Selena Batten). During the testing, the technician will ask you to remember different types of material, solve problems, and answer some questionnaires. Your family member will not be present for this portion of the evaluation. ?  ?Please note: We must reserve several hours of the neuropsychologist's time and the psychometrician's time for your evaluation appointment. As such, there is a No-Show fee of $100. If you are unable to attend any of your appointments, please contact our office as soon as possible to reschedule.  ? ? ?FALL PRECAUTIONS: Be cautious when walking. Scan the area for obstacles that may increase the risk of trips and falls. When getting up in the mornings, sit up at the edge of the bed for a few minutes before getting out of bed. Consider elevating the bed at the head end to avoid drop of blood pressure when getting up. Walk always in a well-lit room (use night lights in the walls). Avoid area rugs or power cords from appliances in the middle of the walkways. Use a walker or a cane if necessary and consider physical therapy  for balance exercise. Get your eyesight checked regularly. ? ?FINANCIAL OVERSIGHT: Supervision, especially oversight when making financial decisions or transactions is also recommended. ? ?HOME SAFETY: Consider the safety of the kitchen when operating appliances like stoves, microwave oven, and blender. Consider having supervision and share cooking  responsibilities until no longer able to participate in those. Accidents with firearms and other hazards in the house should be identified and addressed as well. ? ? ?ABILITY TO BE LEFT ALONE: If patient is unable to contact 911 operator, consider using LifeLine, or when the need is there, arrange for someone to stay with patients. Smoking is a fire hazard, consider supervision or cessation. Risk of wandering should be assessed by caregiver and if detected at any point, supervision and safe proof recommendations should be instituted. ? ?MEDICATION SUPERVISION: Inability to self-administer medication needs to be constantly addressed. Implement a mechanism to ensure safe administration of the medications. ? ? ?DRIVING: Regarding driving, in patients with progressive memory problems, driving will be impaired. We advise to have someone else do the driving if trouble finding directions or if minor accidents are reported. Independent driving assessment is available to determine safety of driving. ? ? ?If you are interested in the driving assessment, you can contact the following: ? ?[facility] [employer] [facility] in [place] [phone] ? ?[facility] [phone] ? ?[facility] [phone] ? ?[facility] [phone] or [phone] ? ? ? ?Mediterranean Diet ?A Mediterranean diet refers to food and lifestyle choices that are based on the traditions of countries located on the [employer] [place]. This way of eating has been shown to help prevent certain conditions and improve outcomes for people who have chronic diseases, like kidney disease and heart disease. ?What are tips for following this plan? ?Lifestyle  ?Cook and eat meals together with your family, when possible. ?Drink enough fluid to keep your urine clear or pale yellow. ?Be physically active every day. This includes: ?Aerobic exercise like running or swimming. ?Leisure activities like gardening, walking, or housework. ?Get 7-8  hours of sleep each night. ?If recommended by your health care provider, drink red wine in moderation. This means 1 glass a day for nonpregnant women and 2 glasses a day for men. A glass of wine equals 5 oz (150 mL). ?Reading food labels  ?Check the serving size of packaged foods. For foods such as rice and pasta, the serving size refers to the amount of cooked product, not dry. ?Check the total fat in packaged foods. Avoid foods that have saturated fat or trans fats. ?Check the ingredients list for added sugars, such as corn syrup. ?Shopping  ?At the grocery store, buy most of your food from the areas near the walls of the store. This includes: ?Fresh fruits and vegetables (produce). ?Grains, beans, nuts, and seeds. Some of these may be available in unpackaged forms or large amounts (in bulk). ?Fresh seafood. ?Poultry and eggs. ?Low-fat dairy products. ?Buy whole ingredients instead of prepackaged foods. ?Buy fresh fruits and vegetables in-season from local farmers markets. ?Buy frozen fruits and vegetables in resealable bags. ?If you do not have access to quality fresh seafood, buy precooked frozen shrimp or canned fish, such as tuna, salmon, or sardines. ?Buy small amounts of raw or cooked vegetables, salads, or olives from the deli or salad bar at your store. ?Stock your pantry so you always have certain foods on hand, such as olive oil, canned tuna, canned tomatoes, rice, pasta, and beans. ?Cooking  ?Cook foods with extra-virgin olive oil instead  of using butter or other vegetable oils. ?Have meat as a side dish, and have vegetables or grains as your main dish. This means having meat in small portions or adding small amounts of meat to foods like pasta or stew. ?Use beans or vegetables instead of meat in common dishes like chili or lasagna. ?Experiment with different cooking methods. Try roasting or broiling vegetables instead of steaming or saut?eing them. ?Add frozen vegetables to soups, stews, pasta, or  rice. ?Add nuts or seeds for added healthy fat at each meal. You can add these to yogurt, salads, or vegetable dishes. ?Marinate fish or vegetables using olive oil, lemon juice, garlic, and fresh herbs. ?Meal planning  ?

## 2021-10-16 ENCOUNTER — Telehealth: Payer: Self-pay | Admitting: Physician Assistant

## 2021-10-16 NOTE — Telephone Encounter (Signed)
Pt called in yesterday afternoon and left a message. He stated he missed a call about 3 PM and was calling us back. ?

## 2021-10-16 NOTE — Telephone Encounter (Signed)
Spoke with pt husband informed her patient that Vit D is low, needs to follow with P CP. The B12 and Thyroid levels are normal  ?

## 2021-10-17 ENCOUNTER — Ambulatory Visit: Payer: No Typology Code available for payment source | Admitting: Internal Medicine

## 2021-10-17 ENCOUNTER — Encounter: Payer: Self-pay | Admitting: Internal Medicine

## 2021-10-17 ENCOUNTER — Other Ambulatory Visit: Payer: Self-pay

## 2021-10-17 VITALS — BP 132/68 | HR 86 | Ht 59.0 in | Wt 93.0 lb

## 2021-10-17 DIAGNOSIS — E7849 Other hyperlipidemia: Secondary | ICD-10-CM

## 2021-10-17 NOTE — Patient Instructions (Signed)

## 2021-10-17 NOTE — Progress Notes (Signed)
? ? ?LIPID CLINIC CONSULT NOTE ? ?Chief Complaint:  ?Manage dyslipidemia ? ?Primary Care Physician: ?Isaac Bliss, Rayford Halsted, MD ? ?Primary Cardiologist:  ?None ? ?HPI:  ?Cheryl [name] is a 61 y.o. female who is being seen today for the evaluation of dyslipidemia at the request of Isaac Bliss, [place] [facility]*. 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  She was thought to have fibromuscular dysplasia as part of an extensive neurologic work-up and has had numerous RI's and carotid imaging studies.  Additionally she is felt to have had progressive memory loss.  There has also been some unusual behavior patterns and refusal to take medications.  She carries a longstanding history of high cholesterol and her mother is a recent patient of mine with a familial hyperlipidemia.  Apparently Cheryl [name] has a twin sister who also has high cholesterol and is on medication for that.  She was seen by neurology a few days ago and felt to have a progressive neurologic disorder such as an Alzheimer's type dementia.  Repeat imaging is pending.  I was asked today to evaluate her carotids although she has had extensive carotid imaging in the past, again showing some fibromuscular dysplasia.  MRI of the brain in the past has been normal. ? ?PMHx:  ?Past Medical History:  ?Diagnosis Date  ? Arthritis   ? Bursitis of left hip   ? Dysphagia   ? Esophagitis   ? Fibromuscular dysplasia ([place])   ? GERD (gastroesophageal reflux disease)   ? Hemorrhoids   ? Hyperlipemia   ? Hypertension   ? Iron deficiency anemia   ? Personal history of colonic polyps   ? Adenomatous   ? Stroke [facility])   ? TIA (transient ischemic attack)   ? ? ?Past Surgical History:  ?Procedure Laterality Date  ? CESAREAN SECTION    ? x 2   ? NASOSEPTOPLASTY    ? [place]  ? ? ?FAMHx:  ?Family History  ?Problem Relation Age of Onset  ? Prostate cancer Father   ? Cancer Father   ?      Laryngeal  ? Hypertension Mother   ? Hyperlipidemia Mother   ? Other Mother   ?     varicose veins  ? Stroke Mother   ? Stroke Maternal Grandmother   ? Diabetes Maternal Aunt   ? Colon cancer Neg Hx   ? ? ?SOCHx:  ? reports that she has never smoked. She has never used smokeless tobacco. She reports that she does not drink alcohol and does not use drugs. ? ?ALLERGIES:  ?Allergies  ?Allergen Reactions  ? Cefazolin Hives and Itching  ? Sulfonamide Derivatives Hives and Itching  ? ? ?ROS: ?Pertinent items noted in HPI and remainder of comprehensive ROS otherwise negative. ? ?HOME MEDS: ?Current Outpatient Medications on File Prior to Visit  ?Medication Sig Dispense Refill  ? amLODipine (NORVASC) 5 MG tablet Take 1 tablet (5 mg total) by mouth daily. 90 tablet 1  ? aspirin EC 81 MG tablet Take 1 tablet (81 mg total) by mouth daily. Swallow whole. 30 tablet 11  ? atorvastatin (LIPITOR) 80 MG tablet Take 1 tablet (80 mg total) by mouth at bedtime. 90 tablet 1  ? ?Current Facility-Administered Medications on File Prior to Visit  ?Medication Dose Route Frequency Provider Last Rate Last Admin  ? gadopentetate dimeglumine (MAGNEVIST) injection 9 mL  9 mL Intravenous Once PRN Garvin Fila, MD      ? ? ?  LABS/IMAGING: ?No results found for this or any previous visit (from the past 48 hour(s)). ?No results found. ? ?LIPID PANEL: ?   ?Component Value Date/Time  ? CHOL 294 (H) 04/03/2021 1029  ? TRIG 65.0 04/03/2021 1029  ? HDL 84.30 04/03/2021 1029  ? CHOLHDL 3 04/03/2021 1029  ? VLDL 13.0 04/03/2021 1029  ? LDLCALC 197 (H) 04/03/2021 1029  ? ? ?WEIGHTS: ?Wt Readings from Last 3 Encounters:  ?10/17/21 93 lb (42.2 kg)  ?10/15/21 90 lb (40.8 kg)  ?10/14/21 90 lb 8 oz (41.1 kg)  ? ? ?VITALS: ?BP 132/68   Pulse 86   Ht 4\' 11"  (1.499 m)   Wt 93 lb (42.2 kg)   LMP 12/22/2011   SpO2 99%   BMI 18.78 kg/m?  ? ?EXAM: ?General appearance: alert and no distress ?Neck: no carotid bruit, no JVD, and thyroid not enlarged, symmetric,  no tenderness/mass/nodules ?Lungs: clear to auscultation bilaterally ?Heart: regular rate and rhythm, S1, S2 normal, no murmur, click, rub or gallop ?Abdomen: soft, non-tender; bowel sounds normal; no masses,  no organomegaly ?Extremities: extremities normal, atraumatic, no cyanosis or edema ?Pulses: 2+ and symmetric ?Skin: Skin color, texture, turgor normal. No rashes or lesions ?Neurologic: Mental status: Awake, follows commands, ?Psych: Tangential, was analyzing her after visit summary sheet and noted that she had several of these at home and they look very much the same ? ?*Examination chaperoned by Sheral Apley, RN. ? ?EKG: ?Deferred ? ?ASSESSMENT: ?Familial hyperlipidemia ?Reported history of TIA ?Fibromuscular dysplasia ?Cognitive/memory disorder ? ?PLAN: ?1.   Cheryl [name] has a familial hyperlipidemia with her mother is a patient of mine also with high cholesterol.  I agree with atorvastatin 80 mg daily however she has not been taking the medication.  This is due to underlying neuropsychiatric issues.  I actually suspect this is more likely psychiatric disorder as her neuroimaging has been unremarkable in the past.  Neurologic work-up is again underway however a psychiatric evaluation may be warranted.  Unfortunately if she is on willing to take her medication, she will not benefit from lipid-lowering which is suggested.  I have little else to offer in the situation. ? ?Thanks for the kind referral.  Follow-up with me as needed. ? ?Pixie Casino, MD, [facility], FACP  ?[place]  ?Medical Director of the [facility] Lipid [facility] &  ?Cardiovascular [facility] Clinic ?Diplomate of the [employer] of Clinical Lipidology ?Attending Cardiologist  ?Direct Dial: [phone]  Fax: [phone]  ?Website:  [email or web address] ? ?Cheryl [name] ?10/17/2021, 9:30 AM ?

## 2021-11-04 ENCOUNTER — Telehealth: Payer: Self-pay | Admitting: Physician Assistant

## 2021-11-04 ENCOUNTER — Other Ambulatory Visit: Payer: No Typology Code available for payment source

## 2021-11-04 NOTE — Telephone Encounter (Signed)
Pt's husband came in wanting to see if we could refer the patient somewhere other than [facility] for an MRI? They do not take the patient's insurance.  ?

## 2021-11-05 NOTE — Telephone Encounter (Signed)
Referral sent to [facility] [phone] fax [phone].  ?

## 2021-11-07 ENCOUNTER — Ambulatory Visit (HOSPITAL_COMMUNITY): Payer: Self-pay | Admitting: Psychiatry

## 2021-12-10 ENCOUNTER — Ambulatory Visit (HOSPITAL_COMMUNITY): Payer: Self-pay | Admitting: Psychiatry

## 2022-01-14 ENCOUNTER — Ambulatory Visit: Payer: No Typology Code available for payment source | Admitting: Internal Medicine

## 2022-01-22 ENCOUNTER — Ambulatory Visit: Payer: No Typology Code available for payment source | Admitting: Physician Assistant

## 2022-01-22 ENCOUNTER — Encounter: Payer: Self-pay | Admitting: Physician Assistant

## 2022-01-22 NOTE — Progress Notes (Unsigned)
Patient did not show to her scheduled appointment to discuss the MRI brain results.   MRI brain w and wo contrast 01/09/22 performed at [facility] is remarkable for punctate acute to early subacute infarct involving the R cingulate isthmus without associated mass effect or hemorrhage.  This changes likely related to hypertension; also seen, cerebral volume loss disproportionately affecting the anterior temporal lobes and raising concern for possible neurodegenerative disease.  She is not on antidementia medication.  She will likely need brain FDG-PET, rule out FTD.  Film is not available for review.  We will try to reach out to patient for discussion of these results.

## 2022-01-26 ENCOUNTER — Ambulatory Visit: Payer: No Typology Code available for payment source | Admitting: Physician Assistant

## 2022-01-26 ENCOUNTER — Encounter: Payer: Self-pay | Admitting: Physician Assistant

## 2022-01-26 VITALS — BP 163/87 | HR 88 | Resp 18 | Ht 60.0 in | Wt 93.0 lb

## 2022-01-26 DIAGNOSIS — F0280 Dementia in other diseases classified elsewhere without behavioral disturbance: Secondary | ICD-10-CM | POA: Diagnosis not present

## 2022-01-26 DIAGNOSIS — G3109 Other frontotemporal dementia: Secondary | ICD-10-CM | POA: Diagnosis not present

## 2022-01-26 MED ORDER — MEMANTINE HCL 10 MG PO TABS: ORAL_TABLET | ORAL | 11 refills | Status: DC

## 2022-01-26 NOTE — Progress Notes (Signed)
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 MoCA and MMSE were unable to be completed answers were very tangential, with inability to follow verbal instructions.  She has very repetitive behaviors and answers.  She had an evaluation in April 2017 at [facility] with same complaints, yielding a MoCA of 25/30 at the time, with an MRI with normal results.  It was felt that her memory difficulties were due to stress.  Patient denies having any memory issues.  Records show that her memory difficulties were documented dating back to 20 years prior, thus not suggestive of Alzheimer's dementia.  There may be a vascular component, given her history of TIAs and carotid artery stenosis fibromuscular dysplasia as above. MRI brain is suggestive of FTD. Patient does demonstrate behavior deterioration, such as apathy, compulsive and erratic behavior in short, extreme changes in behavior and personality.   Recommendations:    Start memantine 10 mg, take 10 mg at night for 2 weeks, then increase to 10 mg twice daily if tolerated.  Side effects discussed Lumbar puncture for definitive diagnosis  FDG PET for FTD Follow up in  6 months.   [name] discussed with Dr. Delice [name] who agrees with the plan     Subjective:    Cheryl [name] is a very pleasant 61 y.o. RH female  seen today in follow up for memory loss. This patient is accompanied in the office by her husband and her mother who supplements the history.  Previous records as well as any outside records available were reviewed prior to todays visit.  Patient was last seen at our office on 10/15/2021 at which time her MoCA or MMSE was unable to be  performed, the patient cannot follow instructions or concentrating the answers.  She is not on any antidementia medication at this time.  Information below is provided by the patient, the husband and her mother do not participate as desired to obtain further data.   Any changes in memory since last visit?  Patient denies any memory issues.  She is very tangential in her answers, and cannot  carry a conversation . Information  cannot be verified, the husband or her mother do not  contribute to any information remaining silent during the whole visit..   Patient lives with: Spouse  repeats oneself?  Patient denies although she is very tangential in her conversation.  Disoriented when walking into a room?  Patient denies   Leaving objects in unusual places?  Patient denies   Ambulates  with difficulty?   Patient denies   Recent falls?  Patient denies   Any head injuries?  Patient denies   History of seizures?   Patient denies   Wandering behavior?  Patient denies   Patient drives?  No longer drives  Any mood changes such irritability agitation?  Patient denies   Any history of depression?:  Patient denies   Hallucinations?  Patient denies   Paranoia?  Patient denies   Patient reports that he sleeps well without vivid dreams, REM behavior or sleepwalking   History of sleep apnea?  Patient denies   Any hygiene concerns?  Patient denies   Independent of bathing and dressing?  Endorsed  Does the patient needs help with  medications?  Husband in charge  Who is in charge of the finances?  Husband is in charge  Any changes in appetite?  Patient denies   Patient have trouble swallowing? Patient denies   Does the patient cook?  Patient denies .  Husband is in charge Any kitchen accidents such as leaving the stove on? Patient denies   Any headaches?  Patient denies   The double vision? Patient denies   Any focal numbness or tingling?  Patient denies   Chronic back pain Patient denies   Unilateral  weakness?  Patient denies   Any tremors?  Patient denies   Any history of anosmia?  Patient denies   Any incontinence of urine?  Patient denies   Any bowel dysfunction?   Patient denies          Initial visit 10/15/2021 The patient is seen in neurologic consultation at the request of Cheryl [name], Cheryl [name]* for the evaluation of memory.  The patient is accompanied by her husband who supplements the history. The patient denies any memory issues, stating that her husband is the one that complains about it, but he is states the whole day outside, and only comes to sleep "he sleeps in the couch anyway ".  Of note, she had been seen by [facility] in 2017 for memory issues, followed until 2019, and it was felt that her memory difficulties were due to stress and it was recommended that she continued participation in activities for stress relaxation like meditation, yoga or regular exercises.  MoCA at the time was 25/30.  MRI of the brain with and without contrast on April 2019 was normal. She repeats herself constantly during the whole visit, especially about her marital issues.  She denies being disoriented when walking into her room, although per chart report, there is report of increased hoarding, and strange behaviors "like looking at the bedroom door at all times of the day, hiding objects especially the remote control ".  Per chart report, "at one point she forgot how to get to her son's school to picking up from the practice ".  Since then, she only drives with a copilot, especially her mother.  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  "At the time, she would spend several hours after her shift ending, trying to keep up with her charting, and had 3 or 4 different jobs after that, terminated after a few weeks at each time.  ". Her husband described increased hoarding.  She ambulates without difficulties, denies wandering behavior is stating that she  likes to stay at home all day long, needing, or just staying in her bedroom.  She denies any mood issues, such as depression, or irritability.  She has never been evaluated by psychiatry or behavioral therapy.  She states that she sleeps well, but she does not like her husband in the room because he "pushes and I do not like him being there ".  She denies sleepwalking or vivid dreams, hallucinations or paranoia.  She denies any hygiene concerns, she is independent of bathing and dressing.  Her husband manages the medications, cooking and the finances.  She denies any headaches, double vision, dizziness, focal numbness or tingling, unilateral weakness, tremors or anosmia. No history of seizures. Denies urine incontinence, retention, constipation or diarrhea.  Denies OSA, ETOH or Tobacco.  No family history of dementia.  Of note, during the whole visit, after answering all the above  questions, she follows her answers with complaints about her husband.    Dementia panel was normal on 11/03/2016  EEG normal MoCA on 10/29/2016 was 25/30 with delayed recall 0/5, geriatric depression scale 5 (borderline for depression).      Labs on 04/03/2021 TSH 1.03, B12 258, vitamin D low 20.1     Ct Head Wo Contrast 08/20/2015  No intracranial mass, hemorrhage, or focal gray -white compartment lesions/acute appearing infarct.    Mr Brain Wo Contrast 08/20/2015  1. No acute intracranial abnormality.    Mr Angiogram Head Wo Contrast 08/20/2015   Negative head MRA.    Mr Angiogram Neck W Wo Contrast 08/20/2015  Diffuse changes of fibromuscular dysplasia involving the cervical internal carotid arteries and vertebral arteries, mildly progressed from prior. Up to 60% stenosis of the right ICA.    2D Echocardiogram  - Left ventricle: The cavity size was normal. Wall thickness was normal. Systolic function was normal. The estimated ejection fraction was in the range of 55% to 60%. Wall motion was normal; there were no regional  wall motion abnormalities. Doppler parameters are consistent with abnormal left ventricular relaxation (grade 1 diastolic dysfunction). - Impressions:  Normal LV systolic function; grade 1 diastolic dysfunction; trace MR and TR.   MRI brain at [facility] (no disc available for review) 01/2022  1. Punctate acute to early subacute infarct involving the right cingulate isthmus. No associated mass effect or hemorrhage. 2. Cerebral volume loss disproportionately affecting the anterior temporal lobes and raising concern for possible neurodegenerative disease.    PREVIOUS MEDICATIONS:   CURRENT MEDICATIONS:  Outpatient Encounter Medications as of 01/26/2022  Medication Sig   amLODipine (NORVASC) 5 MG tablet Take 1 tablet (5 mg total) by mouth daily.   aspirin EC 81 MG tablet Take 1 tablet (81 mg total) by mouth daily. Swallow whole.   atorvastatin (LIPITOR) 80 MG tablet Take 1 tablet (80 mg total) by mouth at bedtime.   Facility-Administered Encounter Medications as of 01/26/2022  Medication   gadopentetate dimeglumine (MAGNEVIST) injection 9 mL        No data to display            10/29/2016    2:32 PM  Montreal Cognitive Assessment   Visuospatial/ Executive (0/5) 5  Naming (0/3) 3  Attention: Read list of digits (0/2) 2  Attention: Read list of letters (0/1) 1  Attention: Serial 7 subtraction starting at 100 (0/3) 1  Language: Repeat phrase (0/2) 2  Language : Fluency (0/1) 1  Abstraction (0/2) 2  Delayed Recall (0/5) 2  Orientation (0/6) 6  Total 25    Objective:     PHYSICAL EXAMINATION:    VITALS:   Vitals:   01/26/22 1308  BP: (!) 163/87  Pulse: 88  Resp: 18  SpO2: 99%  Weight: 93 lb (42.2 kg)  Height: 5' (1.524 m)    GEN:  The patient appears stated age and is in NAD. HEENT:  Normocephalic, atraumatic.   Neurological examination:  General: NAD, well-groomed, appears stated age. Orientation: The patient is alert. Oriented to person, not to place or  date Cranial nerves: There is good facial symmetry.The speech is fluent and clear but very tangential. No aphasia or dysarthria. Fund of knowledge is reduced. Recent and remote memory are impaired. Attention and concentration are reduced. Unable to name objects and repeat phrases.  Hearing is intact to conversational tone.    Sensation: Sensation is intact to light touch throughout Motor: Strength is at least  antigravity x4. Tremors: none  DTR's 2/4 in UE/LE     Movement examination: Tone: There is normal tone in the UE/LE Abnormal movements:  no tremor.  No myoclonus.  No asterixis.   Coordination:  There is decremation with RAM's. Cannot follow the finger to nose command Gait and Station: The patient has no difficulty arising out of a deep-seated chair without the use of the hands. The patient's stride length is good.  Gait is cautious and narrow.    Thank you for allowing [place] the opportunity to participate in the care of this nice patient. Please do not hesitate to contact us for any questions or concerns.   Total time spent on today's visit was 34 minutes dedicated to this patient today, preparing to see patient, examining the patient, ordering tests and/or medications and counseling the patient, documenting clinical information in the EHR or other health record, independently interpreting results and communicating results to the patient/family, discussing treatment and goals, answering patient's questions and coordinating care.  Cc:  Isaac Bliss, Rayford Halsted, MD  Sharene Butters 01/26/2022 1:18 PM

## 2022-01-29 ENCOUNTER — Other Ambulatory Visit (HOSPITAL_COMMUNITY)
Admission: RE | Admit: 2022-01-29 | Discharge: 2022-01-29 | Disposition: A | Discharge status: Home / Self Care | Payer: No Typology Code available for payment source | Source: Ambulatory Visit | Attending: Physician Assistant | Admitting: Physician Assistant

## 2022-01-29 ENCOUNTER — Ambulatory Visit
Admission: RE | Admit: 2022-01-29 | Discharge: 2022-01-29 | Disposition: A | Discharge status: Home / Self Care | Payer: No Typology Code available for payment source | Source: Ambulatory Visit | Attending: Physician Assistant | Admitting: Physician Assistant

## 2022-01-29 VITALS — BP 163/82 | HR 74

## 2022-01-29 DIAGNOSIS — G3109 Other frontotemporal dementia: Secondary | ICD-10-CM | POA: Insufficient documentation

## 2022-01-29 DIAGNOSIS — F0280 Dementia in other diseases classified elsewhere without behavioral disturbance: Secondary | ICD-10-CM | POA: Diagnosis not present

## 2022-01-29 DIAGNOSIS — R413 Other amnesia: Secondary | ICD-10-CM

## 2022-01-29 NOTE — Discharge Instructions (Signed)

## 2022-01-29 NOTE — Progress Notes (Signed)
Serum collected (1 tube) from L AC to be sent with LP blood work. Pt tolerated well. 1 successful attempt. Gauze and tape applied after.

## 2022-01-30 LAB — CYTOLOGY - NON PAP

## 2022-02-05 ENCOUNTER — Telehealth: Payer: Self-pay

## 2022-02-05 NOTE — Telephone Encounter (Signed)
Her medical insurance plan is only pays for wellness. Her plan will pay for her NM PET Metabolic Brain cpt [phone]. Reference Brianna 02/05/2022. FYI.

## 2022-02-06 NOTE — Telephone Encounter (Signed)
Advised to patient, voiced understanding and thanked me for calling.

## 2022-02-27 LAB — CNS IGG SYNTHESIS RATE, CSF+BLOOD
Albumin Serum: 4.3 g/dL (ref 3.6–5.1)
Albumin, CSF: 15.4 mg/dL (ref 8.0–42.0)
CNS-IgG Synthesis Rate: -3 mg/24 h (ref <=3.3)
IgG (Immunoglobin G), Serum: 799 mg/dL (ref 600–1540)
IgG Total CSF: 1.3 mg/dL (ref 0.8–7.7)
IgG-Index: 0.45 (ref <0.70)

## 2022-02-27 LAB — GLUCOSE, CSF: Glucose, CSF: 61 mg/dL (ref 40–80)

## 2022-02-27 LAB — HERPES SIMPLEX VIRUS, TYPE 1 AND 2 DNA,QUAL,RT PCR
HSV 1 DNA: NOT DETECTED
HSV 2 DNA: NOT DETECTED

## 2022-02-27 LAB — HERPES SIMPLEX VIRUS 1/2 (IGG), CSF
HSV 1 IgG Index:: <0.01
HSV 2 IgG Index:: <0.01

## 2022-02-27 LAB — CSF CELL COUNT WITH DIFFERENTIAL
RBC Count, CSF: 5 cells/uL — ABNORMAL HIGH
TOTAL NUCLEATED CELL: 0 cells/uL (ref 0–5)

## 2022-02-27 LAB — FUNGUS CULTURE W SMEAR
CULTURE:: NO GROWTH
MICRO NUMBER:: 13588766
SMEAR:: NONE SEEN
SPECIMEN QUALITY:: ADEQUATE

## 2022-02-27 LAB — LYME DISEASE ABS IGG, IGM, IFA, CSF
Lyme Disease AB (IgG), IBL: NOT DETECTED
Lyme Disease AB (IgM), IBL: NOT DETECTED

## 2022-02-27 LAB — CMV (CYTOMEGALOVIRUS) DNA ULTRAQUANT, PCR
CMV DNA, QN PCR: NOT DETECTED log IU/mL
CMV DNA, QN Real Time PCR: NOT DETECTED IU/mL

## 2022-02-27 LAB — OLIGOCLONAL BANDS, CSF + SERM: Oligo Bands: ABSENT

## 2022-02-27 LAB — MAYO MISC ORDER 2
Miscellaneous Test Results: NEGATIVE
NORMAL RANGE:: NEGATIVE
PRICE:: 125

## 2022-02-27 LAB — MYELIN BASIC PROTEIN, CSF: Myelin Basic Protein: <2 mcg/L (ref <=4.0)

## 2022-02-27 LAB — CRYPTOCOCCAL AG, LTX SCR RFLX TITER
Cryptococcal Ag Screen: NOT DETECTED
MICRO NUMBER:: 13588765
SPECIMEN QUALITY:: ADEQUATE

## 2022-02-27 LAB — WEST NILE AB, IGG AND IGM, CSF
West Nile Ab, IgG, CSF: <1.3 index (ref <1.30)
West Nile Ab, IgM, CSF: <0.9 index (ref <0.90)

## 2022-02-27 LAB — PROTEIN, CSF: Total Protein, CSF: <4 mg/dL — ABNORMAL LOW (ref 15–60)

## 2022-02-27 LAB — ANGIOTENSIN CONVERTING ENZYME, CSF: ANGIOTENSIN CONVERTING ENZYME ( ACE) CSF: 6 U/L (ref <=15)

## 2022-03-04 ENCOUNTER — Ambulatory Visit (HOSPITAL_COMMUNITY): Payer: Self-pay | Admitting: Psychiatry

## 2022-04-28 ENCOUNTER — Ambulatory Visit (HOSPITAL_COMMUNITY): Payer: Self-pay | Admitting: Psychiatry

## 2022-04-28 ENCOUNTER — Encounter: Payer: Self-pay | Admitting: Physician Assistant

## 2022-04-28 ENCOUNTER — Ambulatory Visit: Payer: No Typology Code available for payment source | Admitting: Physician Assistant

## 2022-04-28 VITALS — BP 156/83 | HR 94 | Resp 20 | Ht 60.0 in | Wt 98.0 lb

## 2022-04-28 DIAGNOSIS — G3109 Other frontotemporal dementia: Secondary | ICD-10-CM

## 2022-04-28 DIAGNOSIS — F0280 Dementia in other diseases classified elsewhere without behavioral disturbance: Secondary | ICD-10-CM

## 2022-04-28 MED ORDER — MEMANTINE HCL 10 MG PO TABS
ORAL_TABLET | ORAL | 11 refills | Status: DC
Start: 2022-04-28 — End: 2023-06-02

## 2022-04-28 NOTE — Patient Instructions (Addendum)
It was a pleasure to see you today at our office.   Recommendations:  Follow up in  3 months Continue Memantine 10 mg twice daily.    Whom to call:  Memory  decline, memory medications: Call our office [phone]   For psychiatric meds, mood meds: Please have your primary care physician manage these medications.   Counseling regarding caregiver distress, including caregiver depression, anxiety and issues regarding community resources, adult day care programs, adult living facilities, or memory care questions:   Feel free to contact Merna, Social Worker at [phone]   For assessment of decision of mental capacity and competency:  Call Dr. Anthoney Harada, geriatric psychiatrist at [phone]  For guidance in geriatric dementia issues please call [facility] [phone]  For guidance regarding [facility] and if placement were needed at the facility, contact Arnell Asal, Social Worker tel: [phone]  If you have any severe symptoms of a stroke, or other severe issues such as confusion,severe chills or fever, etc call 911 or go to the ER as you may need to be evaluated further   Feel free to visit Facebook page " [email or web address]" for tips of how to care for people with memory problems.     RECOMMENDATIONS FOR ALL PATIENTS WITH MEMORY PROBLEMS: 1. Continue to exercise (Recommend 30 minutes of walking everyday, or 3 hours every week) 2. Increase social interactions - continue going to [place] and enjoy social gatherings with friends and family 3. Eat healthy, avoid fried foods and eat more fruits and vegetables 4. Maintain adequate blood pressure, blood sugar, and blood cholesterol level. Reducing the risk of stroke and cardiovascular disease also helps promoting better memory. 5. Avoid stressful situations. Live a simple life and avoid aggravations. Organize your time and prepare for the next day in anticipation. 6. Sleep well, avoid  any interruptions of sleep and avoid any distractions in the bedroom that may interfere with adequate sleep quality 7. Avoid sugar, avoid sweets as there is a strong link between excessive sugar intake, diabetes, and cognitive impairment We discussed the Mediterranean diet, which has been shown to help patients reduce the risk of progressive memory disorders and reduces cardiovascular risk. This includes eating fish, eat fruits and green leafy vegetables, nuts like almonds and hazelnuts, walnuts, and also use olive oil. Avoid fast foods and fried foods as much as possible. Avoid sweets and sugar as sugar use has been linked to worsening of memory function.  There is always a concern of gradual progression of memory problems. If this is the case, then we may need to adjust level of care according to patient needs. Support, both to the patient and caregiver, should then be put into place.    The Alzheimer's Association is here all day, every day for people facing Alzheimer's disease through our free 24/7 Helpline: [phone]. The Helpline provides reliable information and support to all those who need assistance, such as individuals living with memory loss, Alzheimer's or other dementia, caregivers, health care professionals and the public.  Our highly trained and knowledgeable staff can help you with: Understanding memory loss, dementia and Alzheimer's  Medications and other treatment options  General information about aging and brain health  Skills to provide quality care and to find the best care from professionals  Legal, financial and living-arrangement decisions Our Helpline also features: Confidential care consultation provided by master's level clinicians who can help with decision-making support, crisis assistance and education on issues families face every day  Help in a caller's preferred language using our translation service that features more than 200 languages and dialects  Referrals  to local community programs, services and ongoing support     FALL PRECAUTIONS: Be cautious when walking. Scan the area for obstacles that may increase the risk of trips and falls. When getting up in the mornings, sit up at the edge of the bed for a few minutes before getting out of bed. Consider elevating the bed at the head end to avoid drop of blood pressure when getting up. Walk always in a well-lit room (use night lights in the walls). Avoid area rugs or power cords from appliances in the middle of the walkways. Use a walker or a cane if necessary and consider physical therapy for balance exercise. Get your eyesight checked regularly.  FINANCIAL OVERSIGHT: Supervision, especially oversight when making financial decisions or transactions is also recommended.  HOME SAFETY: Consider the safety of the kitchen when operating appliances like stoves, microwave oven, and blender. Consider having supervision and share cooking responsibilities until no longer able to participate in those. Accidents with firearms and other hazards in the house should be identified and addressed as well.   ABILITY TO BE LEFT ALONE: If patient is unable to contact 911 operator, consider using LifeLine, or when the need is there, arrange for someone to stay with patients. Smoking is a fire hazard, consider supervision or cessation. Risk of wandering should be assessed by caregiver and if detected at any point, supervision and safe proof recommendations should be instituted.  MEDICATION SUPERVISION: Inability to self-administer medication needs to be constantly addressed. Implement a mechanism to ensure safe administration of the medications.   DRIVING: Regarding driving, in patients with progressive memory problems, driving will be impaired. We advise to have someone else do the driving if trouble finding directions or if minor accidents are reported. Independent driving assessment is available to determine safety of  driving.   If you are interested in the driving assessment, you can contact the following:  [employer] in [place] [phone]  [facility] [phone]  [facility] [phone] or [phone]      Mediterranean Diet A Mediterranean diet refers to food and lifestyle choices that are based on the traditions of countries located on the [employer]. This way of eating has been shown to help prevent certain conditions and improve outcomes for people who have chronic diseases, like kidney disease and heart disease. What are tips for following this plan? Lifestyle  Cook and eat meals together with your family, when possible. Drink enough fluid to keep your urine clear or pale yellow. Be physically active every day. This includes: Aerobic exercise like running or swimming. Leisure activities like gardening, walking, or housework. Get 7-8 hours of sleep each night. If recommended by your health care provider, drink red wine in moderation. This means 1 glass a day for nonpregnant women and 2 glasses a day for men. A glass of wine equals 5 oz (150 mL). Reading food labels  Check the serving size of packaged foods. For foods such as rice and pasta, the serving size refers to the amount of cooked product, not dry. Check the total fat in packaged foods. Avoid foods that have saturated fat or trans fats. Check the ingredients list for added sugars, such as corn syrup. Shopping  At the grocery store, buy most of your food from the areas near the walls of the store. This includes:  Fresh fruits and vegetables (produce). Grains, beans, nuts, and seeds. Some of these may be available in unpackaged forms or large amounts (in bulk). Fresh seafood. Poultry and eggs. Low-fat dairy products. Buy whole ingredients instead of prepackaged foods. Buy fresh fruits and vegetables in-season from local farmers markets. Buy  frozen fruits and vegetables in resealable bags. If you do not have access to quality fresh seafood, buy precooked frozen shrimp or canned fish, such as tuna, salmon, or sardines. Buy small amounts of raw or cooked vegetables, salads, or olives from the deli or salad bar at your store. Stock your pantry so you always have certain foods on hand, such as olive oil, canned tuna, canned tomatoes, rice, pasta, and beans. Cooking  Cook foods with extra-virgin olive oil instead of using butter or other vegetable oils. Have meat as a side dish, and have vegetables or grains as your main dish. This means having meat in small portions or adding small amounts of meat to foods like pasta or stew. Use beans or vegetables instead of meat in common dishes like chili or lasagna. Experiment with different cooking methods. Try roasting or broiling vegetables instead of steaming or sauteing them. Add frozen vegetables to soups, stews, pasta, or rice. Add nuts or seeds for added healthy fat at each meal. You can add these to yogurt, salads, or vegetable dishes. Marinate fish or vegetables using olive oil, lemon juice, garlic, and fresh herbs. Meal planning  Plan to eat 1 vegetarian meal one day each week. Try to work up to 2 vegetarian meals, if possible. Eat seafood 2 or more times a week. Have healthy snacks readily available, such as: Vegetable sticks with hummus. Greek yogurt. Fruit and nut trail mix. Eat balanced meals throughout the week. This includes: Fruit: 2-3 servings a day Vegetables: 4-5 servings a day Low-fat dairy: 2 servings a day Fish, poultry, or lean meat: 1 serving a day Beans and legumes: 2 or more servings a week Nuts and seeds: 1-2 servings a day Whole grains: 6-8 servings a day Extra-virgin olive oil: 3-4 servings a day Limit red meat and sweets to only a few servings a month What are my food choices? Mediterranean diet Recommended Grains: Whole-grain pasta. Brown rice. Bulgar  wheat. Polenta. Couscous. Whole-wheat bread. [place]. Vegetables: Artichokes. Beets. Broccoli. Cabbage. Carrots. Eggplant. Green beans. Chard. Kale. Spinach. Onions. Leeks. Peas. Squash. Tomatoes. Peppers. Radishes. Fruits: Apples. Apricots. Avocado. Berries. Bananas. Cherries. Dates. Figs. Grapes. Lemons. Melon. Oranges. Peaches. Plums. Pomegranate. Meats and other protein foods: Beans. Almonds. Sunflower seeds. [place] nuts. Peanuts. [place]. Salmon. Scallops. Shrimp. [place]. Tilapia. Clams. Oysters. Eggs. Dairy: Low-fat milk. Cheese. Greek yogurt. Beverages: Water. Red wine. Herbal tea. Fats and oils: Extra virgin olive oil. Avocado oil. Grape seed oil. Sweets and desserts: [place] yogurt with honey. Baked apples. Poached pears. Trail mix. Seasoning and other foods: Basil. Cilantro. Coriander. Cumin. Mint. Parsley. Sage. Rosemary. Tarragon. Garlic. Oregano. Thyme. Pepper. Balsalmic vinegar. Tahini. Hummus. Tomato sauce. Olives. Mushrooms. Limit these Grains: Prepackaged pasta or rice dishes. Prepackaged cereal with added sugar. Vegetables: Deep fried potatoes (french fries). Fruits: Fruit canned in syrup. Meats and other protein foods: Beef. Pork. Lamb. Poultry with skin. Hot dogs. Berniece Salines. Dairy: Ice cream. Sour cream. Whole milk. Beverages: Juice. Sugar-sweetened soft drinks. Beer. Liquor and spirits. Fats and oils: Butter. Canola oil. Vegetable oil. Beef fat (tallow). Lard. Sweets and desserts: Cookies. Cakes. Pies. Candy. Seasoning and other foods: Mayonnaise. Premade sauces and marinades. The items listed may not be a complete  list. Talk with your dietitian about what dietary choices are right for you. Summary The Mediterranean diet includes both food and lifestyle choices. Eat a variety of fresh fruits and vegetables, beans, nuts, seeds, and whole grains. Limit the amount of red meat and sweets that you eat. Talk with your health care provider about whether it is safe for you to drink red  wine in moderation. This means 1 glass a day for nonpregnant women and 2 glasses a day for men. A glass of wine equals 5 oz (150 mL). This information is not intended to replace advice given to you by your health care provider. Make sure you discuss any questions you have with your health care provider. Document Released: 03/12/2016 Document Revised: 04/14/2016 Document Reviewed: 03/12/2016 Elsevier Interactive Patient Education  2017 [employer].

## 2022-04-28 NOTE — Progress Notes (Signed)
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 RH female  former oncology nurse with a history of  hypertension, hyperlipidemia,prior TIA, Vit D deficiency, impaired glucose tolerance, history of bilateral carotid artery stenosis due to fibromuscular dysplasia, iron deficiency anemia seen today in follow up for memory loss.  MRI brain w and wo contrast 01/09/22 performed at [facility]  remarkable for punctate acute to early subacute infarct involving the R cingulate isthmus without associated mass effect or hemorrhage  related to hypertension; also seen, cerebral volume loss disproportionately affecting the anterior temporal lobes and raising concern for possible neurodegenerative disease. PET metabolic was ordered but not yet performed as insurance has denied.     MoCA and MMSE were unable to be completed answers were very tangential, with inability to follow verbal instructions.  She has not been taking her memantine, the family cannot find the med, they suspect she may have thrown out the container. There is noticeable worsening of memory, but she is still able to perform ADLs.    Follow up in 3  months. Resume memantine 10 mg bid. Side effects discussed  Recommend adult day program for social stimulation Continue to control mood as per PCP      Subjective:    This patient is accompanied in the office by her husband and daughter who supplements the history.  Previous records as well as any outside records available were reviewed prior to todays visit.    Any changes in memory since last visit?   Responses are tangential, cannot carry a conversation. Husband  reports that her memory may be "a little bit worse, constant forgetfulness". LTM is pretty decent, except for friends and family that she has not been seeing recentlyShe cannot name simple objects  repeats oneself?  Very tangential, worse than  prior Disoriented when walking into a room?  Patient denies   Leaving objects in unusual places? Endorsed by family. "Hansel Starling out of the box and in the fridge, keys in the fridge and other places. They are found everywhere. Washing the garbage . Hoarding, collecting Ziploc bags and napkins, towels, take out containers are found in the bottom of the closet, on the bed. DVDs all over the bed . She puts it in the DVD player but never watches".  Ambulates  with difficulty?   Patient denies , general clumsiness since 1 y ago.   Recent falls?  Patient denies   Any head injuries?  Patient denies   History of seizures?   Patient denies   Wandering behavior?  Patient denies . Does not like to leave the house  Patient drives?   Patient no longer drives  Any mood changes since last visit?  Endorsed by daughter, sometimes a little agitated" for little things especially when she has to take a med that she needs to take". Any worsening depression?:  "I am not really sure"-daughter says Hallucinations?  She has "friends called They" "They told me to do". Daughter suspects that if she sees a Scientist, research (physical sciences) on TV, then she will be having a conversation with that character.   Paranoia?  Patient denies   Patient reports that sleeps well without vivid dreams, REM behavior or sleepwalking   History of sleep apnea?  Patient denies   Any hygiene concerns?  "I only find she may not get to get in the shower to go somewhere".  Independent of bathing and dressing?  Endorsed  Does the patient needs help with medications? Husband in charge now, because she has been hiding or throwing the meds.  Who is in charge of the finances? Husband is in charge    Any changes in appetite? " It's OK " but sometimes she may forget to eat Patient have trouble swallowing? Patient denies   Does the patient cook?  Patient denies   Any kitchen accidents such as leaving the stove on? Patient denies   Any headaches?  Patient denies   Double vision?  Patient denies   Any focal numbness or tingling?  Patient denies   Chronic back pain Patient denies   Unilateral weakness?  Patient denies   Any tremors?  Patient denies   Any history of anosmia?  Patient denies   Any incontinence of urine?  Patient denies   Any bowel dysfunction?   Patient denies      Patient lives with: Husband      Initial visit 10/15/2021 The patient is seen in neurologic consultation at the request of Leotis Shames* for the evaluation of memory.  The patient is accompanied by her husband who supplements the history. The patient denies any memory issues, stating that her husband is the one that complains about it, but he is states the whole day outside, and only comes to sleep "he sleeps in the couch anyway ".  Of note, she had been seen by [facility] in 2017 for memory issues, followed until 2019, and it was felt that her memory difficulties were due to stress and it was recommended that she continued participation in activities for stress relaxation like meditation, yoga or regular exercises.  MoCA at the time was 25/30.  MRI of the brain with and without contrast on April 2019 was normal. She repeats herself constantly during the whole visit, especially about her marital issues.  She denies being disoriented when walking into her room, although per chart report, there is report of increased hoarding, and strange behaviors "like looking at the bedroom door at all times of the day, hiding objects especially the remote control ".  Per chart report, "at one point she forgot how to get to her son's school to picking up from the practice ".  Since then, she only drives with a copilot, especially her mother.  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  "At the time, she would spend several hours after her shift ending, trying to keep up with her charting, and had 3 or 4 different jobs after that, terminated after a few weeks  at each time.  ". Her husband described increased hoarding.  She ambulates without difficulties, denies wandering behavior is stating that she likes to stay at home all day long, needing, or just staying in her bedroom.  She denies any mood issues, such as depression, or irritability.  She has never been evaluated by psychiatry or behavioral therapy.  She states that she sleeps well, but she does not like her husband in the room because he "pushes and I do not like him being there ".  She denies sleepwalking or vivid dreams, hallucinations or paranoia.  She denies any hygiene concerns, she is independent of bathing and dressing.  Her husband manages the medications, cooking and the finances.  She denies any headaches, double vision, dizziness, focal numbness or tingling, unilateral weakness, tremors or anosmia. No history of seizures. Denies urine incontinence, retention, constipation or diarrhea.  Denies  OSA, ETOH or Tobacco.  No family history of dementia.  Of note, during the whole visit, after answering all the above questions, she follows her answers with complaints about her husband.      Dementia panel was normal on 11/03/2016  EEG normal MoCA on 10/29/2016 was 25/30 with delayed recall 0/5, geriatric depression scale 5 (borderline for depression).      Labs on 04/03/2021 TSH 1.03, B12 258, vitamin D low 20.1     Ct Head Wo Contrast 08/20/2015  No intracranial mass, hemorrhage, or focal gray -white compartment lesions/acute appearing infarct.    Mr Brain Wo Contrast 08/20/2015  1. No acute intracranial abnormality.    Mr Angiogram Head Wo Contrast 08/20/2015   Negative head MRA.    Mr Angiogram Neck W Wo Contrast 08/20/2015  Diffuse changes of fibromuscular dysplasia involving the cervical internal carotid arteries and vertebral arteries, mildly progressed from prior. Up to 60% stenosis of the right ICA.    2D Echocardiogram  - Left ventricle: The cavity size was normal. Wall thickness was  normal. Systolic function was normal. The estimated ejection fraction was in the range of 55% to 60%. Wall motion was normal; there were no regional wall motion abnormalities. Doppler parameters are consistent with abnormal left ventricular relaxation (grade 1 diastolic dysfunction). - Impressions:  Normal LV systolic function; grade 1 diastolic dysfunction; trace MR and TR.    MRI brain at [facility] (no disc available for review) 01/2022  1. Punctate acute to early subacute infarct involving the right cingulate isthmus. No associated mass effect or hemorrhage. 2. Cerebral volume loss disproportionately affecting the anterior temporal lobes and raising concern for possible neurodegenerative disease.      PREVIOUS MEDICATIONS:   CURRENT MEDICATIONS:  Outpatient Encounter Medications as of 04/28/2022  Medication Sig   amLODipine (NORVASC) 5 MG tablet Take 1 tablet (5 mg total) by mouth daily.   aspirin EC 81 MG tablet Take 1 tablet (81 mg total) by mouth daily. Swallow whole.   atorvastatin (LIPITOR) 80 MG tablet Take 1 tablet (80 mg total) by mouth at bedtime.   memantine (NAMENDA) 10 MG tablet Take 1 tablet (10 mg at night) for 2 weeks, then increase to 1 tablet (10 mg) twice a day   [DISCONTINUED] memantine (NAMENDA) 10 MG tablet Take 1 tablet (10 mg at night) for 2 weeks, then increase to 1 tablet (10 mg) twice a day   Facility-Administered Encounter Medications as of 04/28/2022  Medication   gadopentetate dimeglumine (MAGNEVIST) injection 9 mL        No data to display            10/29/2016    2:32 PM  Montreal Cognitive Assessment   Visuospatial/ Executive (0/5) 5  Naming (0/3) 3  Attention: Read list of digits (0/2) 2  Attention: Read list of letters (0/1) 1  Attention: Serial 7 subtraction starting at 100 (0/3) 1  Language: Repeat phrase (0/2) 2  Language : Fluency (0/1) 1  Abstraction (0/2) 2  Delayed Recall (0/5) 2  Orientation (0/6) 6  Total 25    Objective:      PHYSICAL EXAMINATION:    VITALS:   Vitals:   04/28/22 1253  BP: (!) 156/83  Pulse: 94  Resp: 20  SpO2: 98%  Weight: 98 lb (44.5 kg)  Height: 5' (1.524 m)    GEN:  The patient appears stated age and is in NAD. HEENT:  Normocephalic, atraumatic.   Neurological examination:  General: NAD,  well-groomed, appears stated age. Orientation: The patient is alert. Oriented to person, not to place and date Cranial nerves: There is good facial symmetry.The speech is fluent and clear but very tangential. No aphasia or dysarthria. Fund of knowledge is appropriate. Recent and remote memory are impaired. Attention and concentration are reduced.  Able to name objects and repeat phrases.  Hearing is intact to conversational tone.    Sensation: Sensation is intact to light touch throughout Motor: Strength is at least antigravity x4. Tremors: none  DTR's 2/4 in UE/LE     Movement examination: Tone: There is normal tone in the UE/LE Abnormal movements:  no tremor.  No myoclonus.  No asterixis.   Coordination:  There is decremation with RAM's. Cannot follow finger to nose command  Gait and Station: The patient has no difficulty arising out of a deep-seated chair without the use of the hands. The patient's stride length is good.  Gait is cautious and narrow.    Thank you for allowing [place] the opportunity to participate in the care of this nice patient. Please do not hesitate to contact us for any questions or concerns.   Total time spent on today's visit was 45 minutes dedicated to this patient today, preparing to see patient, examining the patient, ordering tests and/or medications and counseling the patient, documenting clinical information in the EHR or other health record, independently interpreting results and communicating results to the patient/family, discussing treatment and goals, answering patient's questions and coordinating care.  Cc:  Isaac Bliss, Rayford Halsted, MD  Sharene Butters 04/28/2022 3:22 PM

## 2022-04-29 ENCOUNTER — Ambulatory Visit (HOSPITAL_COMMUNITY): Payer: Self-pay | Admitting: Psychiatry

## 2022-04-30 ENCOUNTER — Ambulatory Visit (INDEPENDENT_AMBULATORY_CARE_PROVIDER_SITE_OTHER): Payer: No Typology Code available for payment source | Admitting: Licensed Clinical Social Worker

## 2022-04-30 DIAGNOSIS — R413 Other amnesia: Secondary | ICD-10-CM | POA: Diagnosis not present

## 2022-04-30 NOTE — BH Specialist Note (Signed)
Integrated Behavioral Health Initial In-Person Visit  MRN: [id number] Name: Cheryl [name]  Number of [place] Clinician visits: No data recorded Session Start time: 2458    Session End time: 1505  Total time in minutes: 55   Types of Service: Family psychotherapy  Interpretor:No. Interpretor Name and Language: NA   Warm Hand Off Completed.        Subjective: Cheryl [name] is a 61 y.o. female accompanied by Spouse Patient was referred by Sharene Butters for Memory impairment of patient needing some assistance with engagement with socialization, activities, safety in the home, promoting overall well-being, and respite help for caregiver.  . Patient reports the following symptoms/concerns: feeling more confused , lonely, frustration.  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  Pt and husband have two children.  School/Work: Pt worked as a Scientist, clinical (histocompatibility and immunogenetics): Pt able to complete simple task, does not drive .  Life Changes: Changes with cognition .   Patient and/or Family's Strengths/Protective Factors: Concrete supports in place (healthy food, safe environments, etc.)  Goals Addressed: Patient will: Reduce symptoms of: anxiety, depression, and stress Increase knowledge and/or ability of: coping skills, self-management skills, and stress reduction  Demonstrate ability to: Increase healthy adjustment to current life circumstances and Increase adequate support systems for patient/family  Progress towards Goals: Ongoing  Interventions: Interventions utilized: Supportive Counseling,  Psychoeducation and/or Health Education, and Link to [employer]  Standardized Assessments completed: Not Needed  Patient and/or Family Response: LCSW provided education on the important of education of learning about DX and getting connected with community resources .  Learned about goals and needing assistance for husband who still works .  Pt's husband is open and encouraged about adult day center of [facility] or [facility] .  In addition , discussed the importance of self care and provided discussion of the caring for the caregiver .  Allowed caregiver to to express his frustration and grief and loss on the relationship with his wife that he knew .  Furthermore, discussed critical planning and documents of long term care of HCPOA and POA if still possible .  Advised and provided information on Elder [facility]amp and Elder Care navigator.   Patient Centered Plan: Patient is on the following Treatment Plan(s):  Pt to be open to Adult day center and visiting and caregiver following up with resources provided .  Assessment: Patient currently experiencing feeling more confused , lonely, frustration.  Caregiver Pt 's husband verbalizes feelings of being overwhelmed, and stressed .   Patient may benefit from Increase support system and respite for caregiver.Finally, further counseling for processing of emotions and feelings   Plan: Follow up with behavioral health clinician on : As needed  Behavioral recommendations: Stated above with Adult day center, [facility] , support group, resources and information on DX Referral(s): dult day center, [employer] , support group, resources and information on DX "From scale of 1-10, how likely are you to follow plan?": 10  Diallo Ponder A Taylor-Paladino, LCSW

## 2022-08-05 ENCOUNTER — Ambulatory Visit (HOSPITAL_COMMUNITY): Payer: Self-pay | Admitting: Psychiatry

## 2022-08-06 ENCOUNTER — Ambulatory Visit: Payer: No Typology Code available for payment source | Admitting: Physician Assistant

## 2022-08-06 NOTE — Progress Notes (Incomplete)
Assessment/Plan:   Memory Impairment Cheryl [name] is a very pleasant 62 y.o. RH femaleformer oncology nurse with a history of  hypertension, hyperlipidemia,prior TIA, Vit D deficiency, impaired glucose tolerance, history of bilateral carotid artery stenosis due to fibromuscular dysplasia, iron deficiency anemia seen today in follow up for memory loss. Patient is currently on .  01/09/2022 MRI brain performed at [facility] personally reviewed was remarkable for punctate acute to early subacute infarct involving the R cingulate isthmus without associated mass effect or hemorrhage likely related to hypertension; also seen, cerebral volume loss disproportionately affecting the anterior temporal lobes and raising concern for possible neurodegenerative disease. PET metabolic was ordered but not yet performed as insurance has denied.      Follow up in   months. Continue memantine 10 mg twice daily, side effects discussed*** Continue to control mood as per psychiatry and PCP    Subjective:    This patient is accompanied in the office by *** who supplements the history.  Previous records as well as any outside records available were reviewed prior to todays visit. Patient was last seen on 04/28/2022, MoCA and MMSE were unable to be completed as the answers are very tangential, she is unable to follow verbal instructions.***   Any changes in memory since last visit?***Responses are tangential, cannot correct conversation.  Husband reports that her memory may be slightly worse, constant forgetfulness.  Long-term memory is "pretty decent except for friends and family that she has not been seen recently ".  Patient cannot remember to name simple objects. repeats oneself?  Patient is very tangential, worse than prior.*** Disoriented when walking into a room?  Patient denies except occasionally not remembering what patient came to the room for ***  Leaving objects in unusual places?  Endorsed by  family  Wandering behavior?  denies.  Patient does not leave the house. Any personality changes since last visit?  denies   Any worsening depression?:  denies   Hallucinations or paranoia?  Endorsed, she has friends that called "They".  "They told me to do ".  As before, she sees it corrected on TV, then she will be having a conversation with the correct her.  Any sleep changes?  Denies vivid dreams, REM behavior or sleepwalking   Sleep apnea?   denies   Any hygiene concerns?    denies   Independent of bathing and dressing?  Endorsed  Does the patient needs help with medications?  Husband is in charge *** Who is in charge of the finances?  Husband is in charge   *** Any changes in appetite?  denies ***   Patient have trouble swallowing?  denies   Does the patient cook?  Any kitchen accidents such as leaving the stove on? Patient denies   Any headaches?   denies   Chronic back pain  denies   Ambulates with difficulty?     denies   Recent falls or head injuries? denies     Unilateral weakness, numbness or tingling?    denies   Any tremors?  denies   Any anosmia?  Patient denies   Any incontinence of urine?  denies   Any bowel dysfunction?     denies      Patient lives  ***with husband Does the patient drive?***   Initial visit 10/15/2021 The patient is seen in neurologic consultation at the request of Isaac Bliss, [facility]* for the evaluation of memory.  The patient is accompanied by her husband who supplements the  history. The patient denies any memory issues, stating that her husband is the one that complains about it, but he is states the whole day outside, and only comes to sleep "he sleeps in the couch anyway ".  Of note, she had been seen by [facility] in 2017 for memory issues, followed until 2019, and it was felt that her memory difficulties were due to stress and it was recommended that she continued participation in activities for stress relaxation like meditation, yoga or regular  exercises.  MoCA at the time was 25/30.  MRI of the brain with and without contrast on April 2019 was normal. She repeats herself constantly during the whole visit, especially about her marital issues.  She denies being disoriented when walking into her room, although per chart report, there is report of increased hoarding, and strange behaviors "like looking at the bedroom door at all times of the day, hiding objects especially the remote control ".  Per chart report, "at one point she forgot how to get to her son's school to picking up from the practice ".  Since then, she only drives with a copilot, especially her mother.  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  "At the time, she would spend several hours after her shift ending, trying to keep up with her charting, and had 3 or 4 different jobs after that, terminated after a few weeks at each time.  ". Her husband described increased hoarding.  She ambulates without difficulties, denies wandering behavior is stating that she likes to stay at home all day long, needing, or just staying in her bedroom.  She denies any mood issues, such as depression, or irritability.  She has never been evaluated by psychiatry or behavioral therapy.  She states that she sleeps well, but she does not like her husband in the room because he "pushes and I do not like him being there ".  She denies sleepwalking or vivid dreams, hallucinations or paranoia.  She denies any hygiene concerns, she is independent of bathing and dressing.  Her husband manages the medications, cooking and the finances.  She denies any headaches, double vision, dizziness, focal numbness or tingling, unilateral weakness, tremors or anosmia. No history of seizures. Denies urine incontinence, retention, constipation or diarrhea.  Denies OSA, ETOH or Tobacco.  No family history of dementia.  Of note, during the whole visit, after answering all the  above questions, she follows her answers with complaints about her husband.      Dementia panel was normal on 11/03/2016  EEG normal MoCA on 10/29/2016 was 25/30 with delayed recall 0/5, geriatric depression scale 5 (borderline for depression).      Labs on 04/03/2021 TSH 1.03, B12 258, vitamin D low 20.1     Ct Head Wo Contrast 08/20/2015  No intracranial mass, hemorrhage, or focal gray -white compartment lesions/acute appearing infarct.    Mr Brain Wo Contrast 08/20/2015  1. No acute intracranial abnormality.    Mr Angiogram Head Wo Contrast 08/20/2015   Negative head MRA.    Mr Angiogram Neck W Wo Contrast 08/20/2015  Diffuse changes of fibromuscular dysplasia involving the cervical internal carotid arteries and vertebral arteries, mildly progressed from prior. Up to 60% stenosis of the right ICA.    2D Echocardiogram  - Left ventricle: The cavity size was normal. Wall thickness was normal. Systolic function was normal. The estimated ejection fraction was in the range of  55% to 60%. Wall motion was normal; there were no regional wall motion abnormalities. Doppler parameters are consistent with abnormal left ventricular relaxation (grade 1 diastolic dysfunction). - Impressions:  Normal LV systolic function; grade 1 diastolic dysfunction; trace MR and TR.    MRI brain at [facility] (no disc available for review) 01/2022  1. Punctate acute to early subacute infarct involving the right cingulate isthmus. No associated mass effect or hemorrhage. 2. Cerebral volume loss disproportionately affecting the anterior temporal lobes and raising concern for possible neurodegenerative disease.       PREVIOUS MEDICATIONS:   CURRENT MEDICATIONS:  Outpatient Encounter Medications as of 08/06/2022  Medication Sig   amLODipine (NORVASC) 5 MG tablet Take 1 tablet (5 mg total) by mouth daily.   aspirin EC 81 MG tablet Take 1 tablet (81 mg total) by mouth daily. Swallow whole.   atorvastatin (LIPITOR) 80 MG  tablet Take 1 tablet (80 mg total) by mouth at bedtime.   memantine (NAMENDA) 10 MG tablet Take 1 tablet (10 mg at night) for 2 weeks, then increase to 1 tablet (10 mg) twice a day   Facility-Administered Encounter Medications as of 08/06/2022  Medication   gadopentetate dimeglumine (MAGNEVIST) injection 9 mL        No data to display            10/29/2016    2:32 PM  Montreal Cognitive Assessment   Visuospatial/ Executive (0/5) 5  Naming (0/3) 3  Attention: Read list of digits (0/2) 2  Attention: Read list of letters (0/1) 1  Attention: Serial 7 subtraction starting at 100 (0/3) 1  Language: Repeat phrase (0/2) 2  Language : Fluency (0/1) 1  Abstraction (0/2) 2  Delayed Recall (0/5) 2  Orientation (0/6) 6  Total 25    Objective:     PHYSICAL EXAMINATION:    VITALS:  There were no vitals filed for this visit.  GEN:  The patient appears stated age and is in NAD. HEENT:  Normocephalic, atraumatic.   Neurological examination:  General: NAD, well-groomed, appears stated age. Orientation: The patient is alert. Oriented to person, place and date Cranial nerves: There is good facial symmetry.The speech is fluent and clear. No aphasia or dysarthria. Fund of knowledge is appropriate. Recent and remote memory are impaired. Attention and concentration are reduced.  Able to name objects and repeat phrases.  Hearing is intact to conversational tone.    Sensation: Sensation is intact to light touch throughout Motor: Strength is at least antigravity x4. DTR's 2/4 in UE/LE     Movement examination: Tone: There is normal tone in the UE/LE Abnormal movements:  no tremor.  No myoclonus.  No asterixis.   Coordination:  There is no decremation with RAM's. Normal finger to nose  Gait and Station: The patient has no difficulty arising out of a deep-seated chair without the use of the hands. The patient's stride length is good.  Gait is cautious and narrow.    Thank you for allowing [place]  the opportunity to participate in the care of this nice patient. Please do not hesitate to contact us for any questions or concerns.   Total time spent on today's visit was *** minutes dedicated to this patient today, preparing to see patient, examining the patient, ordering tests and/or medications and counseling the patient, documenting clinical information in the EHR or other health record, independently interpreting results and communicating results to the patient/family, discussing treatment and goals, answering patient's questions and coordinating care.  Cc:  Isaac Bliss, Rayford Halsted, MD  Sharene Butters 08/06/2022 7:51 AM

## 2023-05-27 ENCOUNTER — Telehealth: Payer: Self-pay | Admitting: Internal Medicine

## 2023-05-27 NOTE — Telephone Encounter (Signed)
Pt mother is calling and patient feet swollen and they can not get her to leave the house and mom would like some advice

## 2023-05-27 NOTE — Telephone Encounter (Signed)
Left detailed message on machine for patient to schedule an appointment. Mother is aware.

## 2023-05-29 ENCOUNTER — Ambulatory Visit (HOSPITAL_COMMUNITY)
Admission: EM | Admit: 2023-05-29 | Discharge: 2023-05-29 | Disposition: A | Discharge status: Home / Self Care | Payer: No Typology Code available for payment source | Attending: Emergency Medicine | Admitting: Emergency Medicine

## 2023-05-29 ENCOUNTER — Encounter (HOSPITAL_COMMUNITY): Payer: Self-pay | Admitting: *Deleted

## 2023-05-29 ENCOUNTER — Other Ambulatory Visit: Payer: Self-pay

## 2023-05-29 ENCOUNTER — Ambulatory Visit (HOSPITAL_COMMUNITY): Payer: No Typology Code available for payment source

## 2023-05-29 DIAGNOSIS — R600 Localized edema: Secondary | ICD-10-CM | POA: Diagnosis not present

## 2023-05-29 DIAGNOSIS — R2242 Localized swelling, mass and lump, left lower limb: Secondary | ICD-10-CM

## 2023-05-29 DIAGNOSIS — M79672 Pain in left foot: Secondary | ICD-10-CM

## 2023-05-29 NOTE — Discharge Instructions (Addendum)
Follow rice instruction that is attached Use OTC Tylenol as needed for pain Follow-up with PCP for further workup to rule out CHF or gout Or go to ED for develop any new or worsening of his symptoms

## 2023-05-29 NOTE — ED Provider Notes (Signed)
[facility]   [id number] 05/29/23 Arrival Time: 1614  Chief Complaint  Patient presents with   Foot Swelling     SUBJECTIVE:  History from: patient and family.  Cheryl [name] is a 62 y.o. female who presents to the urgent care with a complaint of left foot swelling and pain for the past few days.  Denies any precipitating event or known congestive heart failure.  Family report member is on amlodipine and has not been compliant with it due to dementia.  She denies any aggravating or worsening factor.  Denies any known injury or fall.  Reports pain with range of motion.  She denies chills, fever, nausea, vomiting, diarrhea, shortness of breath, chest pain.   ROS: As per HPI.  All other pertinent ROS negative.     Past Medical History:  Diagnosis Date   Arthritis    Bursitis of left hip    Dysphagia    Esophagitis    Fibromuscular dysplasia (HCC)    GERD (gastroesophageal reflux disease)    Hemorrhoids    Hyperlipemia    Hypertension    Iron deficiency anemia    Personal history of colonic polyps    Adenomatous    Stroke (HCC)    TIA (transient ischemic attack)    Past Surgical History:  Procedure Laterality Date   CESAREAN SECTION     x 2    NASOSEPTOPLASTY     TEMPOROMANDIBULAR JOINT SURGERY  1989   Allergies  Allergen Reactions   Cefazolin Hives and Itching   Sulfonamide Derivatives Hives and Itching   Current Facility-Administered Medications on File Prior to Encounter  Medication Dose Route Frequency Provider Last Rate Last Admin   gadopentetate dimeglumine (MAGNEVIST) injection 9 mL  9 mL Intravenous Once PRN Micki Riley, MD       Current Outpatient Medications on File Prior to Encounter  Medication Sig Dispense Refill   amLODipine (NORVASC) 5 MG tablet Take 1 tablet (5 mg total) by mouth daily. 90 tablet 1   aspirin EC 81 MG tablet Take 1 tablet (81 mg total) by mouth daily. Swallow whole. 30 tablet 11   atorvastatin (LIPITOR) 80 MG tablet  Take 1 tablet (80 mg total) by mouth at bedtime. 90 tablet 1   memantine (NAMENDA) 10 MG tablet Take 1 tablet (10 mg at night) for 2 weeks, then increase to 1 tablet (10 mg) twice a day 60 tablet 11   Social History   Socioeconomic History   Marital status: Married    Spouse name: Not on file   Number of children: 0   Years of education: 16   Highest education level: Not on file  Occupational History   Occupation: Charity fundraiser  Tobacco Use   Smoking status: Never   Smokeless tobacco: Never  Substance and Sexual Activity   Alcohol use: No   Drug use: No   Sexual activity: Not on file  Other Topics Concern   Not on file  Social History Narrative   No regular exercise   Right handed   Drinks caffeine   One story home   Social Determinants of Health   Financial Resource Strain: Not on file  Food Insecurity: Not on file  Transportation Needs: Not on file  Physical Activity: Not on file  Stress: Not on file  Social Connections: Not on file  Intimate Partner Violence: Not on file   Family History  Problem Relation Age of Onset   Prostate cancer Father    Cancer  Father        Laryngeal   Hypertension Mother    Hyperlipidemia Mother    Other Mother        varicose veins   Stroke Mother    Stroke Maternal Grandmother    Diabetes Maternal Aunt    Colon cancer Neg Hx     OBJECTIVE:  Vitals:   05/29/23 1715  BP: (!) 172/95  Pulse: 98  Resp: 18  Temp: 98.1 F (36.7 C)  SpO2: 97%     Physical Exam Vitals and nursing note reviewed.  Constitutional:      General: She is not in acute distress.    Appearance: Normal appearance. She is normal weight. She is not ill-appearing, toxic-appearing or diaphoretic.  HENT:     Head: Normocephalic.  Cardiovascular:     Rate and Rhythm: Normal rate and regular rhythm.     Pulses: Normal pulses.     Heart sounds: Normal heart sounds. No murmur heard.    No friction rub. No gallop.  Pulmonary:     Effort: Pulmonary effort is  normal. No respiratory distress.     Breath sounds: Normal breath sounds. No stridor. No wheezing, rhonchi or rales.  Chest:     Chest wall: No tenderness.  Musculoskeletal:     Right foot: Normal.     Left foot: Swelling and tenderness present.     Comments: The left foot is with obvious deformity when compared to the right foot.  There is swelling present.  There is no ecchymosis, open wound or lesion present.  Pain with range of motion.  Pitting edema 3+ on left foot and ankle  Neurological:     Mental Status: She is alert and oriented to person, place, and time.      LABS:  No results found for this or any previous visit (from the past 24 hour(s)).   RADIOLOGY:  DG Foot Complete Left  Result Date: 05/29/2023 CLINICAL DATA:  foot swelling and pain EXAM: LEFT ANKLE COMPLETE - 3+ VIEW; LEFT FOOT - COMPLETE 3+ VIEW COMPARISON:  None Available. FINDINGS: No acute fracture or dislocation. Joint spaces and alignment are maintained. Possible small subcortical erosion versus subcortical cyst of the base of the fifth metatarsal. No unexpected radiopaque foreign body. Soft tissue edema. IMPRESSION: 1.  No acute fracture or dislocation.  Soft tissue edema. 2. Possible small subcortical erosion versus subcortical cyst of the base of the fifth metatarsal. This is nonspecific but could reflect gout versus degenerative changes. Recommend correlation with laboratory values. Electronically Signed   By: Meda Klinefelter M.D.   On: 05/29/2023 18:25   DG Ankle Complete Left  Result Date: 05/29/2023 CLINICAL DATA:  foot swelling and pain EXAM: LEFT ANKLE COMPLETE - 3+ VIEW; LEFT FOOT - COMPLETE 3+ VIEW COMPARISON:  None Available. FINDINGS: No acute fracture or dislocation. Joint spaces and alignment are maintained. Possible small subcortical erosion versus subcortical cyst of the base of the fifth metatarsal. No unexpected radiopaque foreign body. Soft tissue edema. IMPRESSION: 1.  No acute fracture or  dislocation.  Soft tissue edema. 2. Possible small subcortical erosion versus subcortical cyst of the base of the fifth metatarsal. This is nonspecific but could reflect gout versus degenerative changes. Recommend correlation with laboratory values. Electronically Signed   By: Meda Klinefelter M.D.   On: 05/29/2023 18:25     X-ray is negative for bony abnormality including fracture or dislocation.  I have reviewed the x-ray myself and the radiologist interpretation.  I am in agreement with the radiologist interpretation.   ASSESSMENT & PLAN:  1. Edema of left foot   2. Left foot pain     No orders of the defined types were placed in this encounter.  Discharge instructions   Follow rice instruction that is attached Use OTC Tylenol as needed for pain Follow-up with PCP for further workup to rule out CHF or gout Or go to ED for develop any new or worsening of his symptoms  Call or go to the ED if you have any new or worsening symptoms such as fever, worsening cough, shortness of breath, chest tightness, chest pain, turning blue, changes in mental status, etc...   Reviewed expectations re: course of current medical issues. Questions answered. Outlined signs and symptoms indicating need for more acute intervention. Patient verbalized understanding. After Visit Summary given.          Durward Parcel, FNP 05/29/23 [phone]

## 2023-05-29 NOTE — ED Triage Notes (Signed)
Pt has had swelling to both feet for 3 days . Pt denies any injury. Lt foot mor swollen than Rt foot.

## 2023-06-02 ENCOUNTER — Encounter: Payer: Self-pay | Admitting: Internal Medicine

## 2023-06-02 ENCOUNTER — Ambulatory Visit: Payer: No Typology Code available for payment source | Admitting: Internal Medicine

## 2023-06-02 VITALS — BP 156/98 | HR 88 | Temp 98.1°F | Wt 109.2 lb

## 2023-06-02 DIAGNOSIS — R7302 Impaired glucose tolerance (oral): Secondary | ICD-10-CM

## 2023-06-02 DIAGNOSIS — Z1159 Encounter for screening for other viral diseases: Secondary | ICD-10-CM

## 2023-06-02 DIAGNOSIS — Z23 Encounter for immunization: Secondary | ICD-10-CM | POA: Diagnosis not present

## 2023-06-02 DIAGNOSIS — E559 Vitamin D deficiency, unspecified: Secondary | ICD-10-CM | POA: Diagnosis not present

## 2023-06-02 DIAGNOSIS — G3109 Other frontotemporal dementia: Secondary | ICD-10-CM

## 2023-06-02 DIAGNOSIS — Z114 Encounter for screening for human immunodeficiency virus [HIV]: Secondary | ICD-10-CM

## 2023-06-02 DIAGNOSIS — I10 Essential (primary) hypertension: Secondary | ICD-10-CM | POA: Diagnosis not present

## 2023-06-02 DIAGNOSIS — E785 Hyperlipidemia, unspecified: Secondary | ICD-10-CM

## 2023-06-02 DIAGNOSIS — F0280 Dementia in other diseases classified elsewhere without behavioral disturbance: Secondary | ICD-10-CM | POA: Diagnosis not present

## 2023-06-02 DIAGNOSIS — R600 Localized edema: Secondary | ICD-10-CM

## 2023-06-02 LAB — COMPREHENSIVE METABOLIC PANEL
ALT: 31 U/L (ref 0–35)
AST: 28 U/L (ref 0–37)
Albumin: 4.5 g/dL (ref 3.5–5.2)
Alkaline Phosphatase: 101 U/L (ref 39–117)
BUN: 17 mg/dL (ref 6–23)
CO2: 29 meq/L (ref 19–32)
Calcium: 10 mg/dL (ref 8.4–10.5)
Chloride: 102 meq/L (ref 96–112)
Creatinine, Ser: 0.87 mg/dL (ref 0.40–1.20)
GFR: 71.04 mL/min (ref >60.00)
Glucose, Bld: 86 mg/dL (ref 70–99)
Potassium: 3.5 meq/L (ref 3.5–5.1)
Sodium: 141 meq/L (ref 135–145)
Total Bilirubin: 0.3 mg/dL (ref 0.2–1.2)
Total Protein: 8.1 g/dL (ref 6.0–8.3)

## 2023-06-02 LAB — LIPID PANEL
Cholesterol: 230 mg/dL — ABNORMAL HIGH (ref 0–200)
HDL: 64.1 mg/dL (ref >39.00)
LDL Cholesterol: 150 mg/dL — ABNORMAL HIGH (ref 0–99)
NonHDL: 165.44
Total CHOL/HDL Ratio: 4
Triglycerides: 75 mg/dL (ref 0.0–149.0)
VLDL: 15 mg/dL (ref 0.0–40.0)

## 2023-06-02 LAB — CBC WITH DIFFERENTIAL/PLATELET
Basophils Absolute: 0.1 10*3/uL (ref 0.0–0.1)
Basophils Relative: 0.8 % (ref 0.0–3.0)
Eosinophils Absolute: 0.1 10*3/uL (ref 0.0–0.7)
Eosinophils Relative: 1.2 % (ref 0.0–5.0)
HCT: 41.3 % (ref 36.0–46.0)
Hemoglobin: 13.1 g/dL (ref 12.0–15.0)
Lymphocytes Relative: 13.4 % (ref 12.0–46.0)
Lymphs Abs: 1.3 10*3/uL (ref 0.7–4.0)
MCHC: 31.8 g/dL (ref 30.0–36.0)
MCV: 86.3 fL (ref 78.0–100.0)
Monocytes Absolute: 0.7 10*3/uL (ref 0.1–1.0)
Monocytes Relative: 7.7 % (ref 3.0–12.0)
Neutro Abs: 7.2 10*3/uL (ref 1.4–7.7)
Neutrophils Relative %: 76.9 % (ref 43.0–77.0)
Platelets: 404 10*3/uL — ABNORMAL HIGH (ref 150.0–400.0)
RBC: 4.78 Mil/uL (ref 3.87–5.11)
RDW: 13.8 % (ref 11.5–15.5)
WBC: 9.4 10*3/uL (ref 4.0–10.5)

## 2023-06-02 LAB — TSH: TSH: 1.52 u[IU]/mL (ref 0.35–5.50)

## 2023-06-02 LAB — HEMOGLOBIN A1C: Hgb A1c MFr Bld: 6.2 % (ref 4.6–6.5)

## 2023-06-02 LAB — VITAMIN D 25 HYDROXY (VIT D DEFICIENCY, FRACTURES): VITD: 18.82 ng/mL — ABNORMAL LOW (ref 30.00–100.00)

## 2023-06-02 LAB — VITAMIN B12: Vitamin B-12: 360 pg/mL (ref 211–911)

## 2023-06-02 MED ORDER — ATORVASTATIN CALCIUM 80 MG PO TABS: 80.0000 mg | ORAL_TABLET | Freq: Every day | ORAL | 1 refills | Status: DC

## 2023-06-02 MED ORDER — MEMANTINE HCL 10 MG PO TABS: ORAL_TABLET | ORAL | 11 refills | Status: AC

## 2023-06-02 MED ORDER — AMLODIPINE BESYLATE 5 MG PO TABS: 5.0000 mg | ORAL_TABLET | Freq: Every day | ORAL | 1 refills | Status: DC

## 2023-06-02 NOTE — Addendum Note (Signed)
Addended by: Kern Reap B on: 06/02/2023 11:51 AM   Modules accepted: Orders

## 2023-06-02 NOTE — Progress Notes (Signed)
Established Patient Office Visit     CC/Reason for Visit: Bilateral feet swelling  HPI: Cheryl [name] is a 62 y.o. female who is coming in today for the above mentioned reasons. Past Medical History is significant for: Frontal lobe dementia, hypertension, hyperlipidemia.  Her dementia has progressed.  She now needs a daily caregiver.  Unfortunately she refuses to take all medications and becomes combative when this subject is discussed.  She is brought in today for bilateral feet swelling.  I suspect this is related to very elevated blood pressure and possibly a component of diastolic heart failure.   Past Medical/Surgical History: Past Medical History:  Diagnosis Date   Arthritis    Bursitis of left hip    Dysphagia    Esophagitis    Fibromuscular dysplasia (HCC)    GERD (gastroesophageal reflux disease)    Hemorrhoids    Hyperlipemia    Hypertension    Iron deficiency anemia    Personal history of colonic polyps    Adenomatous    Stroke (HCC)    TIA (transient ischemic attack)     Past Surgical History:  Procedure Laterality Date   CESAREAN SECTION     x 2    NASOSEPTOPLASTY     TEMPOROMANDIBULAR JOINT SURGERY  1989    Social History:  reports that she has never smoked. She has never used smokeless tobacco. She reports that she does not drink alcohol and does not use drugs.  Allergies: Allergies  Allergen Reactions   Cefazolin Hives and Itching   Sulfonamide Derivatives Hives and Itching    Family History:  Family History  Problem Relation Age of Onset   Prostate cancer Father    Cancer Father        Laryngeal   Hypertension Mother    Hyperlipidemia Mother    Other Mother        varicose veins   Stroke Mother    Stroke Maternal Grandmother    Diabetes Maternal Aunt    Colon cancer Neg Hx      Current Outpatient Medications:    amLODipine (NORVASC) 5 MG tablet, Take 1 tablet (5 mg total) by mouth daily., Disp: 90 tablet, Rfl: 1   aspirin EC  81 MG tablet, Take 1 tablet (81 mg total) by mouth daily. Swallow whole. (Patient not taking: Reported on 06/02/2023), Disp: 30 tablet, Rfl: 11   atorvastatin (LIPITOR) 80 MG tablet, Take 1 tablet (80 mg total) by mouth at bedtime., Disp: 90 tablet, Rfl: 1   memantine (NAMENDA) 10 MG tablet, Take 1 tablet (10 mg at night) for 2 weeks, then increase to 1 tablet (10 mg) twice a day, Disp: 60 tablet, Rfl: 11 No current facility-administered medications for this visit.  Facility-Administered Medications Ordered in Other Visits:    gadopentetate dimeglumine (MAGNEVIST) injection 9 mL, 9 mL, Intravenous, Once PRN, Micki Riley, MD  Review of Systems:  Negative unless indicated in HPI.   Physical Exam: Vitals:   06/02/23 1031 06/02/23 1035  BP: (!) 160/100 (!) 156/98  Pulse: 88   Temp: 98.1 F (36.7 C)   TempSrc: Oral   SpO2: 99%   Weight: 109 lb 3.2 oz (49.5 kg)     Body mass index is 21.33 kg/m.   Physical Exam Vitals reviewed.  Constitutional:      Appearance: Normal appearance.  HENT:     Head: Normocephalic and atraumatic.  Eyes:     Conjunctiva/sclera: Conjunctivae normal.     Pupils:  Pupils are equal, round, and reactive to light.  Cardiovascular:     Rate and Rhythm: Normal rate and regular rhythm.  Pulmonary:     Effort: Pulmonary effort is normal.     Breath sounds: Normal breath sounds.  Musculoskeletal:        General: Swelling present.     Right lower leg: Edema present.     Left lower leg: Edema present.  Skin:    General: Skin is warm and dry.  Neurological:     General: No focal deficit present.     Mental Status: She is alert and oriented to person, place, and time.  Psychiatric:        Mood and Affect: Mood normal.        Behavior: Behavior normal.        Thought Content: Thought content normal.        Judgment: Judgment normal.      Impression and Plan:  Bilateral leg edema  Immunization due  Essential hypertension -     Comprehensive  metabolic panel; Future -     CBC with Differential/Platelet; Future -     amLODIPine Besylate; Take 1 tablet (5 mg total) by mouth daily.  Dispense: 90 tablet; Refill: 1  IGT (impaired glucose tolerance) -     Hemoglobin A1c; Future  Vitamin D deficiency -     VITAMIN D 25 Hydroxy (Vit-D Deficiency, Fractures); Future  Dyslipidemia -     Lipid panel; Future -     Atorvastatin Calcium; Take 1 tablet (80 mg total) by mouth at bedtime.  Dispense: 90 tablet; Refill: 1  Encounter for hepatitis C screening test for low risk patient -     Hepatitis C antibody; Future  Encounter for screening for HIV -     HIV Antibody (routine testing w rflx); Future  Frontal lobe dementia (HCC) -     TSH; Future -     Vitamin B12; Future -     Memantine HCl; Take 1 tablet (10 mg at night) for 2 weeks, then increase to 1 tablet (10 mg) twice a day  Dispense: 60 tablet; Refill: 11   -Flu vaccine in office today. -She allows blood work to be done today. -Management is very challenging as she refuses to take all medications.  I took caregiver aside and we discussed crushing pills and dissolving in food or drink.  States they have tried this before unsuccessfully.  She absolutely refuses to take medications and becomes very combative with mother and caregiver and we discussed this subject. -It is time that she schedule follow-up with neurology.  Time spent:31 minutes reviewing chart, interviewing and examining patient and formulating plan of care.     Chaya Jan, MD [facility]

## 2023-06-03 ENCOUNTER — Other Ambulatory Visit: Payer: Self-pay | Admitting: Internal Medicine

## 2023-06-03 DIAGNOSIS — E559 Vitamin D deficiency, unspecified: Secondary | ICD-10-CM

## 2023-06-03 LAB — HEPATITIS C ANTIBODY: Hepatitis C Ab: NONREACTIVE

## 2023-06-03 LAB — HIV ANTIBODY (ROUTINE TESTING W REFLEX): HIV 1&2 Ab, 4th Generation: NONREACTIVE

## 2023-06-03 MED ORDER — VITAMIN D (ERGOCALCIFEROL) 1.25 MG (50000 UNIT) PO CAPS: 50000.0000 [IU] | ORAL_CAPSULE | ORAL | 0 refills | Status: AC

## 2023-06-08 ENCOUNTER — Other Ambulatory Visit: Payer: Self-pay | Admitting: *Deleted

## 2023-06-08 DIAGNOSIS — E559 Vitamin D deficiency, unspecified: Secondary | ICD-10-CM

## 2023-06-16 ENCOUNTER — Ambulatory Visit: Payer: No Typology Code available for payment source | Admitting: Physician Assistant

## 2023-06-17 ENCOUNTER — Encounter: Payer: Self-pay | Admitting: Physician Assistant

## 2023-06-17 ENCOUNTER — Ambulatory Visit: Payer: No Typology Code available for payment source | Admitting: Physician Assistant

## 2023-06-17 VITALS — BP 138/88 | HR 94 | Resp 18 | Ht 63.0 in

## 2023-06-17 DIAGNOSIS — F69 Unspecified disorder of adult personality and behavior: Secondary | ICD-10-CM

## 2023-06-17 DIAGNOSIS — G3109 Other frontotemporal dementia: Secondary | ICD-10-CM | POA: Diagnosis not present

## 2023-06-17 DIAGNOSIS — F0280 Dementia in other diseases classified elsewhere without behavioral disturbance: Secondary | ICD-10-CM | POA: Diagnosis not present

## 2023-06-17 DIAGNOSIS — Z79899 Other long term (current) drug therapy: Secondary | ICD-10-CM

## 2023-06-17 NOTE — Progress Notes (Signed)
Assessment/Plan:   Dementia of unclear etiology with behavioral disturbance, suspected FTD versus AD, moderate to advanced  Cheryl [name] is a very pleasant 62 y.o. 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  Has missed several appointments, has not been seen at our office since 04/28/2022.  Patient is currently on memantine 10 mg twice daily as re-placed by her PCP.  As recalled, she had been missing these doses for a long time.  Initially, she had been placed on 2022, and then on her last visit on 2023 and now.Prior MRI of the brain with and without contrast on June 2023 at [facility], was remarkable for punctate acute to early subacute infarct involving the right cingulate isthmus without associated mass effect or hemorrhage related to hypertension, cerebral volume loss disproportionately affecting the anterior temporal lobes and raising concern for possible neurodegenerative disease To date, her ordered PET scan and LP has not yet been performed.  However, at this point, given progression of her disease, this testing may not be of use.  She needs 24/7 care.  She is unable to complete answers, she is very tangential with inability to follow verbal instructions.  However, she is still very mobile, able to eat on her own and do other simple tasks by herself.  Family is discussing with PCP having social work involvement.  The patient will be returning 1 or 2 months for further evaluation as she appears tired today and she has been experiencing sundowning, the visit is around the time that she begins to have them.  Offered medication such as Depakote, but her family reports that she refuses to take the medications some "will speak to them ", will fight  it.  It is unclear if there is any underlying psychiatric issue.  The referral has been placed for evaluation and medication manage ment-options for behavioral issues.  Follow up in 1-2  months. Continue memantine 10 mg twice daily for now, as per PCP, side effects discussed Recommend 24/7 monitoring for safety. Recommend good control of her cardiovascular risk factors Referral to psychiatry has been placed, as above     Subjective:    This patient is accompanied in the office by her husband and daughter and caregiver who supplements the history.  Previous records as well as any outside records available were reviewed prior to todays visit. Patient was last seen on September 2023.   Any changes in memory since last visit? " Worse, especially day to day" she does not participate in any intellectual activities.  "Constantly pacing on the floor ". repeats oneself?  Endorsed. Worse than before  Disoriented when walking into a room?  Patient denies. Routine is difficult, unable to maintain a schedule     Leaving objects?  Endorsed, recently she put a sock in the microwave.  Wandering behavior?  Started to do it a bit, sometimes the door  is open but mostly she looks  outside  Any personality changes since last visit?  Very easy to get irritable, can be hot and cold.  She is scared of the microwave and dryer.  She has more sundowning than before Any worsening depression?:  Denies.   Hallucinations or paranoia?  Auditory hallucination, talking to people that are not there, referring them to "They".  She is more  paranoid than before. Seizures? denies    Any sleep changes?  Denies vivid dreams, REM behavior or sleepwalking   Sleep apnea?   Denies.   Any hygiene concerns? She has to physically bathe her or detangle her hair. Easier to place her on the tub.   Independent of bathing and dressing?  Endorsed  Does the patient needs help with medications?  Daughter is in charge   Who is in charge of  the finances?  Daughter  is in charge   Any changes in appetite?  Denies.      Patient have trouble swallowing? Denies.   Does the patient cook? No Any headaches?   denies   Chronic back pain  denies   Ambulates with difficulty? Denies.    Recent falls or head injuries? denies     Unilateral weakness, numbness or tingling? denies   Any tremors?  Denies   Any anosmia?  Denies   Any incontinence of urine?  Denies  Any bowel dysfunction?   Denies      Patient lives with  husband and her daughter  Does the patient drive? No longer drives      PREVIOUS MEDICATIONS:   CURRENT MEDICATIONS:  Outpatient Encounter Medications as of 06/17/2023  Medication Sig   amLODipine (NORVASC) 5 MG tablet Take 1 tablet (5 mg total) by mouth daily.   aspirin EC 81 MG tablet Take 1 tablet (81 mg total) by mouth daily. Swallow whole.   atorvastatin (LIPITOR) 80 MG tablet Take 1 tablet (80 mg total) by mouth at bedtime.   memantine (NAMENDA) 10 MG tablet Take 1 tablet (10 mg at night) for 2 weeks, then increase to 1 tablet (10 mg) twice a day   Vitamin D, Ergocalciferol, (DRISDOL) 1.25 MG (50000 UNIT) CAPS capsule Take 1 capsule (50,000 Units total) by mouth every 7 (seven) days for 12 doses.   Facility-Administered Encounter Medications as of 06/17/2023  Medication   gadopentetate dimeglumine (MAGNEVIST) injection 9 mL        No data to display            10/29/2016    2:32 PM  Montreal Cognitive Assessment   Visuospatial/ Executive (0/5) 5  Naming (0/3) 3  Attention: Read list of digits (0/2) 2  Attention: Read list of letters (0/1) 1  Attention: Serial 7 subtraction starting at 100 (0/3) 1  Language: Repeat phrase (0/2) 2  Language : Fluency (0/1) 1  Abstraction (0/2) 2  Delayed Recall (0/5) 2  Orientation (0/6) 6  Total 25    Objective:     PHYSICAL EXAMINATION:    VITALS:   Vitals:   06/17/23 1510  Resp: 18      Neurological examination: Patient is unable to participate  on physical examination, does not follow commands, constantly talking to and not on NTP, looking for stuff on her purse, unintelligible words expressed.     Thank you for allowing [place] the opportunity to participate in the care of this nice patient. Please do not hesitate to contact us for any questions or concerns.   Total time spent on today's visit was 30 minutes dedicated to this patient today, preparing to see patient, examining the patient, ordering tests and/or medications and counseling the patient, documenting clinical information in the EHR or other health record, independently interpreting results and communicating results to the patient/family, discussing treatment and goals, answering patient's questions and coordinating care.  Cc:  Philip Aspen, Limmie Patricia, MD  Marlowe Kays 06/17/2023  3:19 PM

## 2023-06-17 NOTE — Patient Instructions (Signed)
It was a pleasure to see you today at our office.   Recommendations:  Follow up in  2 months Continue Memantine 10 mg twice daily.    Whom to call:  Memory  decline, memory medications: Call our office [phone]   For psychiatric meds, mood meds: Please have your primary care physician manage these medications.   Counseling regarding caregiver distress, including caregiver depression, anxiety and issues regarding community resources, adult day care programs, adult living facilities, or memory care questions:   Feel free to contact Misty Lisabeth Register, Social Worker at [phone]   For assessment of decision of mental capacity and competency:  Call Dr. Erick Blinks, geriatric psychiatrist at [phone]  For guidance in geriatric dementia issues please call [facility] [phone]  For guidance regarding [facility] and if placement were needed at the facility, contact Sidney Ace, Social Worker tel: [phone]  If you have any severe symptoms of a stroke, or other severe issues such as confusion,severe chills or fever, etc call 911 or go to the ER as you may need to be evaluated further   Feel free to visit Facebook page " [employer]" for tips of how to care for people with memory problems.     RECOMMENDATIONS FOR ALL PATIENTS WITH MEMORY PROBLEMS: 1. Continue to exercise (Recommend 30 minutes of walking everyday, or 3 hours every week) 2. Increase social interactions - continue going to [place] and enjoy social gatherings with friends and family 3. Eat healthy, avoid fried foods and eat more fruits and vegetables 4. Maintain adequate blood pressure, blood sugar, and blood cholesterol level. Reducing the risk of stroke and cardiovascular disease also helps promoting better memory. 5. Avoid stressful situations. Live a simple life and avoid aggravations. Organize your time and prepare for the next day in anticipation. 6. Sleep well, avoid  any interruptions of sleep and avoid any distractions in the bedroom that may interfere with adequate sleep quality 7. Avoid sugar, avoid sweets as there is a strong link between excessive sugar intake, diabetes, and cognitive impairment We discussed the Mediterranean diet, which has been shown to help patients reduce the risk of progressive memory disorders and reduces cardiovascular risk. This includes eating fish, eat fruits and green leafy vegetables, nuts like almonds and hazelnuts, walnuts, and also use olive oil. Avoid fast foods and fried foods as much as possible. Avoid sweets and sugar as sugar use has been linked to worsening of memory function.  There is always a concern of gradual progression of memory problems. If this is the case, then we may need to adjust level of care according to patient needs. Support, both to the patient and caregiver, should then be put into place.    The Alzheimer's Association is here all day, every day for people facing Alzheimer's disease through our free 24/7 Helpline: [phone]. The Helpline provides reliable information and support to all those who need assistance, such as individuals living with memory loss, Alzheimer's or other dementia, caregivers, health care professionals and the public.  Our highly trained and knowledgeable staff can help you with: Understanding memory loss, dementia and Alzheimer's  Medications and other treatment options  General information about aging and brain health  Skills to provide quality care and to find the best care from professionals  Legal, financial and living-arrangement decisions Our Helpline also features: Confidential care consultation provided by master's level clinicians who can help with decision-making support, crisis assistance and education on issues families face every day  Help in a caller's preferred language using our translation service that features more than 200 languages and dialects  Referrals  to local community programs, services and ongoing support     FALL PRECAUTIONS: Be cautious when walking. Scan the area for obstacles that may increase the risk of trips and falls. When getting up in the mornings, sit up at the edge of the bed for a few minutes before getting out of bed. Consider elevating the bed at the head end to avoid drop of blood pressure when getting up. Walk always in a well-lit room (use night lights in the walls). Avoid area rugs or power cords from appliances in the middle of the walkways. Use a walker or a cane if necessary and consider physical therapy for balance exercise. Get your eyesight checked regularly.  FINANCIAL OVERSIGHT: Supervision, especially oversight when making financial decisions or transactions is also recommended.  HOME SAFETY: Consider the safety of the kitchen when operating appliances like stoves, microwave oven, and blender. Consider having supervision and share cooking responsibilities until no longer able to participate in those. Accidents with firearms and other hazards in the house should be identified and addressed as well.   ABILITY TO BE LEFT ALONE: If patient is unable to contact 911 operator, consider using LifeLine, or when the need is there, arrange for someone to stay with patients. Smoking is a fire hazard, consider supervision or cessation. Risk of wandering should be assessed by caregiver and if detected at any point, supervision and safe proof recommendations should be instituted.  MEDICATION SUPERVISION: Inability to self-administer medication needs to be constantly addressed. Implement a mechanism to ensure safe administration of the medications.   DRIVING: Regarding driving, in patients with progressive memory problems, driving will be impaired. We advise to have someone else do the driving if trouble finding directions or if minor accidents are reported. Independent driving assessment is available to determine safety of  driving.   If you are interested in the driving assessment, you can contact the following:  [employer] in [place] [phone]  [facility] [phone]  [facility] [phone] or [phone]      Mediterranean Diet A Mediterranean diet refers to food and lifestyle choices that are based on the traditions of countries located on the [employer]. This way of eating has been shown to help prevent certain conditions and improve outcomes for people who have chronic diseases, like kidney disease and heart disease. What are tips for following this plan? Lifestyle  Cook and eat meals together with your family, when possible. Drink enough fluid to keep your urine clear or pale yellow. Be physically active every day. This includes: Aerobic exercise like running or swimming. Leisure activities like gardening, walking, or housework. Get 7-8 hours of sleep each night. If recommended by your health care provider, drink red wine in moderation. This means 1 glass a day for nonpregnant women and 2 glasses a day for men. A glass of wine equals 5 oz (150 mL). Reading food labels  Check the serving size of packaged foods. For foods such as rice and pasta, the serving size refers to the amount of cooked product, not dry. Check the total fat in packaged foods. Avoid foods that have saturated fat or trans fats. Check the ingredients list for added sugars, such as corn syrup. Shopping  At the grocery store, buy most of your food from the areas near the walls of the store. This includes:  Fresh fruits and vegetables (produce). Grains, beans, nuts, and seeds. Some of these may be available in unpackaged forms or large amounts (in bulk). Fresh seafood. Poultry and eggs. Low-fat dairy products. Buy whole ingredients instead of prepackaged foods. Buy fresh fruits and vegetables in-season from local farmers markets. Buy  frozen fruits and vegetables in resealable bags. If you do not have access to quality fresh seafood, buy precooked frozen shrimp or canned fish, such as tuna, salmon, or sardines. Buy small amounts of raw or cooked vegetables, salads, or olives from the deli or salad bar at your store. Stock your pantry so you always have certain foods on hand, such as olive oil, canned tuna, canned tomatoes, rice, pasta, and beans. Cooking  Cook foods with extra-virgin olive oil instead of using butter or other vegetable oils. Have meat as a side dish, and have vegetables or grains as your main dish. This means having meat in small portions or adding small amounts of meat to foods like pasta or stew. Use beans or vegetables instead of meat in common dishes like chili or lasagna. Experiment with different cooking methods. Try roasting or broiling vegetables instead of steaming or sauteing them. Add frozen vegetables to soups, stews, pasta, or rice. Add nuts or seeds for added healthy fat at each meal. You can add these to yogurt, salads, or vegetable dishes. Marinate fish or vegetables using olive oil, lemon juice, garlic, and fresh herbs. Meal planning  Plan to eat 1 vegetarian meal one day each week. Try to work up to 2 vegetarian meals, if possible. Eat seafood 2 or more times a week. Have healthy snacks readily available, such as: Vegetable sticks with hummus. Greek yogurt. Fruit and nut trail mix. Eat balanced meals throughout the week. This includes: Fruit: 2-3 servings a day Vegetables: 4-5 servings a day Low-fat dairy: 2 servings a day Fish, poultry, or lean meat: 1 serving a day Beans and legumes: 2 or more servings a week Nuts and seeds: 1-2 servings a day Whole grains: 6-8 servings a day Extra-virgin olive oil: 3-4 servings a day Limit red meat and sweets to only a few servings a month What are my food choices? Mediterranean diet Recommended Grains: Whole-grain pasta. Brown rice. Bulgar  wheat. Polenta. Couscous. Whole-wheat bread. Orpah Cobb. Vegetables: Artichokes. Beets. Broccoli. Cabbage. Carrots. Eggplant. Green beans. Chard. Kale. Spinach. Onions. Leeks. Peas. Squash. Tomatoes. Peppers. Radishes. Fruits: Apples. Apricots. Avocado. Berries. Bananas. Cherries. Dates. Figs. Grapes. Lemons. Melon. Oranges. Peaches. Plums. Pomegranate. Meats and other protein foods: Beans. Almonds. Sunflower seeds. Pine nuts. Peanuts. Cod. Salmon. Scallops. Shrimp. Tuna. Tilapia. Clams. Oysters. Eggs. Dairy: Low-fat milk. Cheese. Greek yogurt. Beverages: Water. Red wine. Herbal tea. Fats and oils: Extra virgin olive oil. Avocado oil. Grape seed oil. Sweets and desserts: [place] yogurt with honey. Baked apples. Poached pears. Trail mix. Seasoning and other foods: Basil. Cilantro. Coriander. Cumin. Mint. Parsley. Sage. Rosemary. Tarragon. Garlic. Oregano. Thyme. Pepper. Balsalmic vinegar. Tahini. Hummus. Tomato sauce. Olives. Mushrooms. Limit these Grains: Prepackaged pasta or rice dishes. Prepackaged cereal with added sugar. Vegetables: Deep fried potatoes (french fries). Fruits: Fruit canned in syrup. Meats and other protein foods: Beef. Pork. Lamb. Poultry with skin. Hot dogs. Tomasa Blase. Dairy: Ice cream. Sour cream. Whole milk. Beverages: Juice. Sugar-sweetened soft drinks. Beer. Liquor and spirits. Fats and oils: Butter. Canola oil. Vegetable oil. Beef fat (tallow). Lard. Sweets and desserts: Cookies. Cakes. Pies. Candy. Seasoning and other foods: Mayonnaise. Premade sauces and marinades. The items listed may not be a complete  list. Talk with your dietitian about what dietary choices are right for you. Summary The Mediterranean diet includes both food and lifestyle choices. Eat a variety of fresh fruits and vegetables, beans, nuts, seeds, and whole grains. Limit the amount of red meat and sweets that you eat. Talk with your health care provider about whether it is safe for you to drink red  wine in moderation. This means 1 glass a day for nonpregnant women and 2 glasses a day for men. A glass of wine equals 5 oz (150 mL). This information is not intended to replace advice given to you by your health care provider. Make sure you discuss any questions you have with your health care provider. Document Released: 03/12/2016 Document Revised: 04/14/2016 Document Reviewed: 03/12/2016 Elsevier Interactive Patient Education  2017 [employer].

## 2023-07-13 ENCOUNTER — Emergency Department (HOSPITAL_COMMUNITY): Payer: No Typology Code available for payment source

## 2023-07-13 ENCOUNTER — Emergency Department (HOSPITAL_COMMUNITY)
Admission: EM | Admit: 2023-07-13 | Discharge: 2023-07-13 | Disposition: A | Discharge status: Home / Self Care | Payer: No Typology Code available for payment source | Attending: Emergency Medicine | Admitting: Emergency Medicine

## 2023-07-13 ENCOUNTER — Encounter (HOSPITAL_COMMUNITY): Payer: Self-pay

## 2023-07-13 DIAGNOSIS — R269 Unspecified abnormalities of gait and mobility: Secondary | ICD-10-CM

## 2023-07-13 DIAGNOSIS — R4182 Altered mental status, unspecified: Secondary | ICD-10-CM | POA: Diagnosis present

## 2023-07-13 DIAGNOSIS — Z79899 Other long term (current) drug therapy: Secondary | ICD-10-CM | POA: Insufficient documentation

## 2023-07-13 DIAGNOSIS — Z7982 Long term (current) use of aspirin: Secondary | ICD-10-CM | POA: Diagnosis not present

## 2023-07-13 DIAGNOSIS — I10 Essential (primary) hypertension: Secondary | ICD-10-CM | POA: Diagnosis not present

## 2023-07-13 LAB — URINALYSIS, ROUTINE W REFLEX MICROSCOPIC
Bilirubin Urine: NEGATIVE
Glucose, UA: NEGATIVE mg/dL
Hgb urine dipstick: NEGATIVE
Ketones, ur: NEGATIVE mg/dL
Leukocytes,Ua: NEGATIVE
Nitrite: NEGATIVE
Protein, ur: NEGATIVE mg/dL
Specific Gravity, Urine: 1.006 (ref 1.005–1.030)
pH: 7 (ref 5.0–8.0)

## 2023-07-13 LAB — DIFFERENTIAL
Abs Immature Granulocytes: 0.03 10*3/uL (ref 0.00–0.07)
Basophils Absolute: 0 10*3/uL (ref 0.0–0.1)
Basophils Relative: 1 %
Eosinophils Absolute: 0.1 10*3/uL (ref 0.0–0.5)
Eosinophils Relative: 2 %
Immature Granulocytes: 0 %
Lymphocytes Relative: 17 %
Lymphs Abs: 1.3 10*3/uL (ref 0.7–4.0)
Monocytes Absolute: 0.8 10*3/uL (ref 0.1–1.0)
Monocytes Relative: 10 %
Neutro Abs: 5.1 10*3/uL (ref 1.7–7.7)
Neutrophils Relative %: 70 %

## 2023-07-13 LAB — COMPREHENSIVE METABOLIC PANEL
ALT: 18 U/L (ref 0–44)
AST: 26 U/L (ref 15–41)
Albumin: 4 g/dL (ref 3.5–5.0)
Alkaline Phosphatase: 89 U/L (ref 38–126)
Anion gap: 7 (ref 5–15)
BUN: 13 mg/dL (ref 8–23)
CO2: 26 mmol/L (ref 22–32)
Calcium: 9.4 mg/dL (ref 8.9–10.3)
Chloride: 108 mmol/L (ref 98–111)
Creatinine, Ser: 0.84 mg/dL (ref 0.44–1.00)
GFR, Estimated: >60 mL/min (ref >60)
Glucose, Bld: 88 mg/dL (ref 70–99)
Potassium: 4.2 mmol/L (ref 3.5–5.1)
Sodium: 141 mmol/L (ref 135–145)
Total Bilirubin: 0.5 mg/dL (ref <1.2)
Total Protein: 7 g/dL (ref 6.5–8.1)

## 2023-07-13 LAB — APTT: aPTT: 33 s (ref 24–36)

## 2023-07-13 LAB — I-STAT CHEM 8, ED
BUN: 16 mg/dL (ref 8–23)
Calcium, Ion: 1.15 mmol/L (ref 1.15–1.40)
Chloride: 107 mmol/L (ref 98–111)
Creatinine, Ser: 0.9 mg/dL (ref 0.44–1.00)
Glucose, Bld: 85 mg/dL (ref 70–99)
HCT: 43 % (ref 36.0–46.0)
Hemoglobin: 14.6 g/dL (ref 12.0–15.0)
Potassium: 4.1 mmol/L (ref 3.5–5.1)
Sodium: 143 mmol/L (ref 135–145)
TCO2: 25 mmol/L (ref 22–32)

## 2023-07-13 LAB — CBC
HCT: 44.2 % (ref 36.0–46.0)
Hemoglobin: 14 g/dL (ref 12.0–15.0)
MCH: 27.7 pg (ref 26.0–34.0)
MCHC: 31.7 g/dL (ref 30.0–36.0)
MCV: 87.4 fL (ref 80.0–100.0)
Platelets: 282 10*3/uL (ref 150–400)
RBC: 5.06 MIL/uL (ref 3.87–5.11)
RDW: 13.9 % (ref 11.5–15.5)
WBC: 7.3 10*3/uL (ref 4.0–10.5)
nRBC: 0 % (ref 0.0–0.2)

## 2023-07-13 LAB — PROTIME-INR
INR: 1.1 (ref 0.8–1.2)
Prothrombin Time: 14.1 s (ref 11.4–15.2)

## 2023-07-13 LAB — RAPID URINE DRUG SCREEN, HOSP PERFORMED
Amphetamines: NOT DETECTED
Barbiturates: NOT DETECTED
Benzodiazepines: NOT DETECTED
Cocaine: NOT DETECTED
Opiates: NOT DETECTED
Tetrahydrocannabinol: NOT DETECTED

## 2023-07-13 LAB — ETHANOL: Alcohol, Ethyl (B): <10 mg/dL (ref <10)

## 2023-07-13 NOTE — Discharge Instructions (Signed)
The urine tests, blood scans and CT and MRI did not show signs of stroke, infection, or other life threatening conditions.  Please follow up with your primary care provider.  A case Production designer, theatre/television/film or social worker may reach out to Cheryl [name] tomorrow to discuss home health options.

## 2023-07-13 NOTE — Consult Note (Signed)
NEUROLOGY CONSULT NOTE   Date of service: July 13, 2023 Patient Name: Cheryl [name] MRN:  [id number] DOB:  [birth year] Chief Complaint: "I'm sick" Requesting Provider: Lonell Grandchild, MD  History of Present Illness  Cheryl [name] is a 62 y.o. female  has a past medical history of Arthritis, Bursitis of left hip, Dysphagia, Esophagitis, Fibromuscular dysplasia (HCC), GERD (gastroesophageal reflux disease), Hemorrhoids, Hyperlipemia, Hypertension, Iron deficiency anemia, Personal history of colonic polyps, Stroke (HCC), and TIA (transient ischemic attack).  Further patient has a history of vitamin D deficiency, bilateral carotid artery stenosis due to fibromuscular dysplasia, IDA, and memory deficit on memantine and is followed outpatient by [facility].  Family stays with the patient for constant care and was noted to be in her usual state of health at 09:30 this morning when one family member left for work.  When another family member woke at 11:00 AM this morning, the patient was noticed to be having more trouble with her speech than usual and seemed to have trouble with ambulating for which she was brought to the ED for further evaluation.  On evaluation in triage, a Code Stroke was activated due to altered mental status and initial concern for grip strength weakness.   At baseline, patient requires constant care, is unable to answer questions appropriately, is tangential and is unable to follow verbal instructions though she retains her ability to ambulate and complete simple tasks independently.  She has a history of refusing her medications at home including spitting the medications out with questionable underlying psychiatric issue in addition to her memory deficit. She was unable to undergo MMSE or MoCA testing in the past outpatient as well. Previous MRI brain imaging in June 2023 at [facility] health reveals cerebral volume loss disproportionately affecting the anterior temporal lobes  raising concern for possible neurodegenerative disease but further work up has not been completed due to the progression of the disease.   LKW: 09:30 AM Modified rankin score: 4-Needs assistance to walk and tend to bodily needs IV Thrombolysis:  No, no new focal deficits noted on initial neurology exam, patient outside of thrombolytic therapy time window.  EVT:  No, presentation is not consistent with an LVO  NIHSS components Score: Comment  1a Level of Conscious 0[x]  1[]  2[]  3[]      1b LOC Questions 0[]  1[]  2[x]      Reported baseline  1c LOC Commands 0[]  1[]  2[x]      Reported baseline  2 Best Gaze 0[x]  1[]  2[]       3 Visual 0[x]  1[]  2[]  3[]      4 Facial Palsy 0[x]  1[]  2[]  3[]      5a Motor Arm - left 0[x]  1[]  2[]  3[]  4[]  UN[]    5b Motor Arm - Right 0[x]  1[]  2[]  3[]  4[]  UN[]    6a Motor Leg - Left 0[x]  1[]  2[]  3[]  4[]  UN[]    6b Motor Leg - Right 0[x]  1[]  2[]  3[]  4[]  UN[]    7 Limb Ataxia 0[x]  1[]  2[]  3[]  UN[]     8 Sensory 0[x]  1[]  2[]  UN[]      9 Best Language 0[]  1[]  2[x]  3[]     Reported baseline  10 Dysarthria 0[x]  1[]  2[]  UN[]      11 Extinct. and Inattention 0[x]  1[]  2[]       TOTAL: 6     ROS   Unable to ascertain due to patient's limited communication and inability to answer questions at baseline.   Past History   Past Medical History:  Diagnosis Date   Arthritis  Bursitis of left hip    Dysphagia    Esophagitis    Fibromuscular dysplasia (HCC)    GERD (gastroesophageal reflux disease)    Hemorrhoids    Hyperlipemia    Hypertension    Iron deficiency anemia    Personal history of colonic polyps    Adenomatous    Stroke (HCC)    TIA (transient ischemic attack)    Past Surgical History:  Procedure Laterality Date   CESAREAN SECTION     x 2    NASOSEPTOPLASTY     TEMPOROMANDIBULAR JOINT SURGERY  1989   Family History: Family History  Problem Relation Age of Onset   Prostate cancer Father    Cancer Father        Laryngeal   Hypertension Mother     Hyperlipidemia Mother    Other Mother        varicose veins   Stroke Mother    Stroke Maternal Grandmother    Diabetes Maternal Aunt    Colon cancer Neg Hx    Social History  reports that she has never smoked. She has never used smokeless tobacco. She reports that she does not drink alcohol and does not use drugs.  Allergies  Allergen Reactions   Cefazolin Hives and Itching   Sulfonamide Derivatives Hives and Itching   Medications  No current facility-administered medications for this encounter.  Current Outpatient Medications:    amLODipine (NORVASC) 5 MG tablet, Take 1 tablet (5 mg total) by mouth daily., Disp: 90 tablet, Rfl: 1   aspirin EC 81 MG tablet, Take 1 tablet (81 mg total) by mouth daily. Swallow whole., Disp: 30 tablet, Rfl: 11   atorvastatin (LIPITOR) 80 MG tablet, Take 1 tablet (80 mg total) by mouth at bedtime., Disp: 90 tablet, Rfl: 1   memantine (NAMENDA) 10 MG tablet, Take 1 tablet (10 mg at night) for 2 weeks, then increase to 1 tablet (10 mg) twice a day, Disp: 60 tablet, Rfl: 11   Vitamin D, Ergocalciferol, (DRISDOL) 1.25 MG (50000 UNIT) CAPS capsule, Take 1 capsule (50,000 Units total) by mouth every 7 (seven) days for 12 doses., Disp: 12 capsule, Rfl: 0  Facility-Administered Medications Ordered in Other Encounters:    gadopentetate dimeglumine (MAGNEVIST) injection 9 mL, 9 mL, Intravenous, Once PRN, Micki Riley, MD  Vitals   Vitals:   07/13/23 1312  BP: (!) 165/99  Pulse: 78  Resp: 16  Temp: 98.7 F (37.1 C)  SpO2: 100%    There is no height or weight on file to calculate BMI.  Physical Exam   Constitutional: Thin appearing African American female in no acute distress Psych: Patient appears pleasantly confused during examination though is cooperative throughout.  Eyes: No scleral injection.  HENT: No OP obstruction.  Head: Normocephalic.  Cardiovascular: Normal rate and regular rhythm on cardiac monitor Respiratory: Effort normal,  non-labored breathing on room air GI: Soft.  No distension. There is no tenderness. Skin: WDI.   Neurologic Examination   Mental Status: Patient is awake, alert, oriented to self.  She is unable to answer further orientation questions or name objects.  She appears pleasantly confused.  She states "I am sick" at one point during the exam but is unable to elaborate.  Patient confabulates during examination.  Patient is tangential and does not follow verbal commands or instruction.  She is an unreliable historian.  Cranial Nerves: II: VFF, PERRL III,IV, VI: EOMI without gaze preference V: Reacts to light touch on the face  bilaterally  VII: Facial movement is symmetric resting and with movement VIII: Hearing is intact to voice X: Phonation intact XI: Head moves side-to-side XII: Does not protrude tongue to command Motor: Tone is normal. Bulk is normal.  She is able to elevate each extremity antigravity without vertical drift with constant coaching.  She moves each extremity spontaneously and antigravity. Does not squeeze examiner's hands to command. Sensory: Reacts to light touch equally throughout.  Cerebellar: She does not have any clear ataxia, though does not cooperate well with following commands to do finger-nose-finger  Labs/Imaging/Neurodiagnostic studies   CBC:  Recent Labs  Lab 2023-08-08 1350 2023/08/08 1403  WBC 7.3  --   NEUTROABS 5.1  --   HGB 14.0 14.6  HCT 44.2 43.0  MCV 87.4  --   PLT 282  --    Basic Metabolic Panel:  Lab Results  Component Value Date   NA 143 August 08, 2023   K 4.1 2023-08-08   CO2 29 06/02/2023   GLUCOSE 85 2023/08/08   BUN 16 Aug 08, 2023   CREATININE 0.90 2023/08/08   CALCIUM 10.0 06/02/2023   GFRNONAA >60 08/23/2015   GFRAA >60 08/23/2015   Lipid Panel:  Lab Results  Component Value Date   LDLCALC 150 (H) 06/02/2023   HgbA1c:  Lab Results  Component Value Date   HGBA1C 6.2 06/02/2023   Urine Drug Screen: No results found for:  "LABOPIA", "COCAINSCRNUR", "LABBENZ", "AMPHETMU", "THCU", "LABBARB"  Alcohol Level No results found for: "ETH" INR  Lab Results  Component Value Date   INR 1.09 08/20/2015   APTT  Lab Results  Component Value Date   APTT 34 08/20/2015   AED levels: No results found for: "PHENYTOIN", "ZONISAMIDE", "LAMOTRIGINE", "LEVETIRACETA"  CT Head without contrast(Personally reviewed): 1. No hemorrhage or CT evidence of an acute cortical infarct. 2. Interval increase in size of the ventricular system compared to prior brain MRI dated 11/11/2016. The degree of ventricular enlargement is disproportionate to the degree of volume loss. Findings are worrisome for early hydrocephalus.  ASSESSMENT   Katheleen Hamson is a 62 y.o. female with baseline memory deficit, HTN, HLD, IDA, CVA, TIA, vitamin D deficiency, bilateral carotid artery stenosis due to fibromuscular dysplasia, and requires 24/7 care due to the nature of her memory deficit who presented to the ED 2023/08/08 when family noticed that the patient's speech seemed worse than baseline in addition to gait disturbance this morning.  On neurology evaluation, the patient did not follow verbal commands and is only oriented to self (baseline) without findings of acute neurologic deficit from baseline.  Will obtain MRI brain for evaluation of possible small stroke.  Presentation may also be due to infectious or metabolic disturbance with subsequent acute encephalopathy. If MRI brain is negative, favor identification and treatment of physiologic stressor.   RECOMMENDATIONS  - MRI brain without contrast - If MRI brain reveals acute ischemia will expand stroke work up at that time - Identification and treatment of infectious or metabolic abnormalities per EDP / primary team  - Neurology will follow MRI results  ______________________________________________________________________  Lanae Boast, AGACNP-BC [facility] Pager: [phone]  I  have seen the patient and reviewed the above note.  It is possible that this represents an acute stroke, though my suspicion is that this likely represents more of a response to some mild physiological stressor, such as a UTI or cold or other insult.  I do think ruling out stroke with MRI is prudent, but if this is negative I would  assess for other evidence of physiological stress.  If she improves, and is doing well, then I do not think she necessarily needs to be admitted, but if MRI is positive, or her gait is significantly different than typical, then she may need to be.  Ritta Slot, MD [facility] [phone]  If 7pm- 7am, please page neurology on call as listed in AMION.

## 2023-07-13 NOTE — ED Notes (Signed)
Attempted in and out with family in room present, patient uncooperative with same. Provider notified.

## 2023-07-13 NOTE — ED Provider Triage Note (Addendum)
Emergency Medicine Provider Triage Evaluation Note  Cheryl [name] , a 62 y.o. female  was evaluated in triage.  Pt complains of TIA not taking DAPT or other medications. Per daughter this is similar to previous TIA. Last known well was 11 am. Daughter states patient was slurring words, unable to use right hand with fork earlier, and gait abnormalities. Lasted for 30 mins and resolved. Daughter concerned for CVA vs TIA. No recent illness, dysuria, medication changes, fevers, cp, shob, sick contacts.  Review of Systems  Positive: See HPI Negative: See HPI  Physical Exam  BP (!) 165/99 (BP Location: Right Arm)   Pulse 78   Temp 98.7 F (37.1 C)   Resp 16   LMP 12/22/2011   SpO2 100%  Gen:   Awake, altered and giving inappropriate response to questions Resp:  Normal effort  MSK:   Moves extremities without difficulty  Other:  Finger to nose normal, pupils PERRL b/l, vision grossly intact, no gait abnormalities, abd nontender, 5/5 grip strength, equal sensation in all 4 extremities  Medical Decision Making  Medically screening exam initiated at 1:48 PM.  Appropriate orders placed.  Yazhini Caruthers was informed that the remainder of the evaluation will be completed by another provider, this initial triage assessment does not replace that evaluation, and the importance of remaining in the ED until their evaluation is complete.  Workup started, due to patient's AMS with symptoms being similar to previous TIA and noncompliance with medications and reported neuro deficits and being within the window as last known normal was 11 code stroke was called, nursing staff notified.   Netta Corrigan, PA-C 07/13/23 1356    Evlyn Kanner T, PA-C 07/13/23 1356

## 2023-07-13 NOTE — ED Notes (Signed)
Patient transported to MRI 

## 2023-07-13 NOTE — ED Provider Notes (Signed)
[facility] AT [facility] Provider Note  CSN: [id number] Arrival date & time: 07/13/23 1244  Chief Complaint(s) Altered Mental Status  HPI Cheryl [name] is a 62 y.o. female history of frontal lobe dementia presenting to the emergency department with behavior change.  Patient brought in by family because she seemed to be different today, may be more confused with the gait abnormality.  Patient not complaining of any pain, no recent nausea or vomiting, fevers or chills, diarrhea, or other acute symptoms.  Patient was evaluated in triage and code stroke was activated by PA.  History limited due to underlying dementia.   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 Authorizing Provider  amLODipine (NORVASC) 5 MG tablet Take 1 tablet (5 mg total) by mouth daily. 06/02/23    Philip Aspen, Limmie Patricia, MD  aspirin EC 81 MG tablet Take 1 tablet (81 mg total) by mouth daily. Swallow whole. 11/01/20   Philip Aspen, Limmie Patricia, MD  atorvastatin (LIPITOR) 80 MG tablet Take 1 tablet (80 mg total) by mouth at bedtime. 06/02/23   Philip Aspen, Limmie Patricia, MD  memantine (NAMENDA) 10 MG tablet Take 1 tablet (10 mg at night) for 2 weeks, then increase to 1 tablet (10 mg) twice a day 06/02/23   Philip Aspen, Limmie Patricia, MD  Vitamin D, Ergocalciferol, (DRISDOL) 1.25 MG (50000 UNIT) CAPS capsule Take 1 capsule (50,000 Units total) by mouth every 7 (seven) days for 12 doses. 06/03/23 08/20/23  Henderson Cloud, MD                                                                                                                                    Past Surgical History Past Surgical History:  Procedure Laterality Date   CESAREAN SECTION     x 2    NASOSEPTOPLASTY     TEMPOROMANDIBULAR JOINT SURGERY  1989   Family History Family History  Problem Relation Age of Onset   Prostate cancer Father    Cancer Father        Laryngeal   Hypertension Mother    Hyperlipidemia Mother    Other Mother        varicose veins   Stroke Mother    Stroke Maternal Grandmother    Diabetes Maternal Aunt    Colon cancer Neg Hx     Social History Social History   Tobacco Use   Smoking status: Never   Smokeless tobacco: Never  Substance Use Topics   Alcohol use: No   Drug use: No   Allergies Cefazolin and Sulfonamide derivatives  Review of Systems Review of Systems  All other systems reviewed and are negative.   Physical Exam Vital Signs  I have reviewed the triage vital signs BP (!) 156/72   Pulse 73   Temp 98.7 F (37.1 C)   Resp 19   LMP 12/22/2011   SpO2 100%  Physical Exam Vitals and nursing note reviewed.  Constitutional:      General: She is not in acute distress.    Appearance: She is well-developed.  HENT:     Head: Normocephalic and atraumatic.      Mouth/Throat:     Mouth: Mucous membranes are moist.  Eyes:     Pupils: Pupils are equal, round, and reactive to light.  Cardiovascular:     Rate and Rhythm: Normal rate and regular rhythm.     Heart sounds: No murmur heard. Pulmonary:     Effort: Pulmonary effort is normal. No respiratory distress.     Breath sounds: Normal breath sounds.  Abdominal:     General: Abdomen is flat.     Palpations: Abdomen is soft.     Tenderness: There is no abdominal tenderness.  Musculoskeletal:        General: No tenderness.     Right lower leg: No edema.     Left lower leg: No edema.  Skin:    General: Skin is warm and dry.  Neurological:     Mental Status: She is alert.     Comments: Follows commands, at baseline, moves all 4 extremities, no cranial nerve deficit  Psychiatric:        Mood and Affect: Mood normal.        Behavior: Behavior normal.     ED Results and Treatments Labs (all labs ordered are listed, but only abnormal results are displayed) Labs Reviewed  ETHANOL  PROTIME-INR  APTT  CBC  DIFFERENTIAL  COMPREHENSIVE METABOLIC PANEL  RAPID URINE DRUG SCREEN, HOSP PERFORMED  URINALYSIS, ROUTINE W REFLEX MICROSCOPIC  I-STAT CHEM 8, ED  Radiology CT HEAD CODE STROKE WO CONTRAST  Result Date: 07/13/2023 CLINICAL DATA:  Code stroke. Altered mental status. Slurred speech. EXAM: CT HEAD WITHOUT CONTRAST TECHNIQUE: Contiguous axial images were obtained from the base of the skull through the vertex without intravenous contrast. RADIATION DOSE REDUCTION: This exam was performed according to the departmental dose-optimization program which includes automated exposure control, adjustment of the mA and/or kV according to patient size and/or use of iterative reconstruction technique. COMPARISON:  Brain MR 11/11/16 FINDINGS: Brain: No extra-axial fluid collection. No  mass effect. No mass lesion. Mineralization of the basal ganglia bilaterally. No hemorrhage. No CT evidence of an acute cortical infarct. There is interval increase in size of the ventricular system compared to prior brain MRI dated 11/11/2016. The degree of ventricular enlargement seen somewhat disproportionate to the degree of volume loss. Vascular: No hyperdense vessel or unexpected calcification. Skull: Normal. Negative for fracture or focal lesion. Sinuses/Orbits: No middle ear or mastoid effusion. Paranasal sinuses are clear. Orbits are unremarkable. Other: None. ASPECTS ([place] Stroke Program Early CT Score): 10 IMPRESSION: 1. No hemorrhage or CT evidence of an acute cortical infarct. 2. Interval increase in size of the ventricular system compared to prior brain MRI dated 11/11/2016. The degree of ventricular enlargement is disproportionate to the degree of volume loss. Findings are worrisome for early hydrocephalus. Findings were paged to Dr. Amada Jupiter on 07/13/23 at 2:18 PM. Electronically Signed   By: Lorenza Cambridge M.D.   On: 07/13/2023 14:20    Pertinent labs & imaging results that were available during my care of the patient were reviewed by me and considered in my medical decision making (see MDM for details).  Medications Ordered in ED Medications - No data to display                                                                                                                                   Procedures Procedures  (including critical care time)  Medical Decision Making / ED Course   MDM:  62 year old presenting to the emergency department with concern for altered mental status.  Patient overall well-appearing, no clear deficits on my examination.  She has had no recent symptoms that would be suggestive of any specific underlying cause of her confusion.  She was seen by neurology as a code stroke, TNK was considered however she was outside the window as they felt her last known  normal was 9:30 AM, and she has no clear deficits on exam from her baseline.  Differential also includes toxic or metabolic causes so broad laboratory workup sent.  Will also check urinalysis to evaluate for occult infectious cause.  No other focal symptoms to suggest any specific underlying infection.  Neurology does recommend proceeding with MRI.  Signed out to oncoming physician pending MRI, urinalysis.  Laboratory workup overall reassuring.         Additional history obtained: -Additional history obtained from family -External  records from outside source obtained and reviewed including: Chart review including previous notes, labs, imaging, consultation notes including prior notes    Lab Tests: -I ordered, reviewed, and interpreted labs.   The pertinent results include:   Labs Reviewed  ETHANOL  PROTIME-INR  APTT  CBC  DIFFERENTIAL  COMPREHENSIVE METABOLIC PANEL  RAPID URINE DRUG SCREEN, HOSP PERFORMED  URINALYSIS, ROUTINE W REFLEX MICROSCOPIC  I-STAT CHEM 8, ED     Imaging Studies ordered: I ordered imaging studies including CT head On my interpretation imaging demonstrates no acute process I independently visualized and interpreted imaging. I agree with the radiologist interpretation   Medicines ordered and prescription drug management: No orders of the defined types were placed in this encounter.   -I have reviewed the patients home medicines and have made adjustments as needed   Consultations Obtained: I requested consultation with the neurologist,  and discussed lab and imaging findings as well as pertinent plan - they recommend: MRI   Cardiac Monitoring: The patient was maintained on a cardiac monitor.  I personally viewed and interpreted the cardiac monitored which showed an underlying rhythm of: NSR  Reevaluation: After the interventions noted above, I reevaluated the patient and found that their symptoms have improved  Co morbidities that complicate  the patient evaluation  Past Medical History:  Diagnosis Date   Arthritis    Bursitis of left hip    Dysphagia    Esophagitis    Fibromuscular dysplasia (HCC)    GERD (gastroesophageal reflux disease)    Hemorrhoids    Hyperlipemia    Hypertension    Iron deficiency anemia    Personal history of colonic polyps    Adenomatous    Stroke (HCC)    TIA (transient ischemic attack)       Dispostion: Disposition decision including need for hospitalization was considered, and patient disposition pending at time of sign out.    Final Clinical Impression(s) / ED Diagnoses Final diagnoses:  Altered mental status, unspecified altered mental status type     This chart was dictated using voice recognition software.  Despite best efforts to proofread,  errors can occur which can change the documentation meaning.    Lonell Grandchild, MD 07/13/23 1600

## 2023-07-13 NOTE — Code Documentation (Signed)
Stroke Response Nurse Documentation Code Documentation  Cheryl [name] is a 61 y.o. female arriving to [facility]  via [employer] on 07/13/2023 with past medical hx of dysphagia, frontal lobe dementia, hyperlipemia, hypertension, bilateral carotid artery stenosis and stroke. On aspirin 81 mg daily.   Patient from home where she was LKW at 0930 when husband left for work. Daughter woke up at 1100 and noted increased difficulty with speech, confusion and ambulation. Family drove her to the ED. Code stroke was activated by ED.   Labs drawn and patient cleared for CT by EDP. Patient to CT where team met them. NIHSS 4, see documentation for details and code stroke times. Patient with disoriented, not following commands, and Global aphasia  on exam. The following imaging was completed:  CT Head. Patient is not a candidate for IV Thrombolytic due to out of window. Patient is not a candidate for IR due to no new focal deficits. Plan discussed with family.    Care Plan: Q2 VS and NIHSS; MRI.   Bedside handoff with ED paramedic.    Ferman Hamming Stroke Response RN

## 2023-07-13 NOTE — ED Notes (Signed)
RN was not made aware of CODE STROKE until 1400 by the PA

## 2023-07-13 NOTE — ED Provider Notes (Signed)
62 yo female with hx of dementia presenting with slurred speech, difficulty using her fork  Arrived as code stroke - CT Head negative, seen by neurologist who recommended MRI imaging  Pending MRI and UA  Physical Exam  BP (!) 156/72   Pulse 73   Temp 98.7 F (37.1 C)   Resp 19   LMP 12/22/2011   SpO2 100%   Physical Exam  Procedures  Procedures  ED Course / MDM    Medical Decision Making Amount and/or Complexity of Data Reviewed Radiology: ordered.   Patient reevaluated after workup.  UA without evidence of infection.  MRI did not show acute stroke.  I discussed with neurology who had evaluated the patient initially they do not feel that the patient is requiring admission from a neurological perspective.  The questionable NPH findings, which are subjective, can be worked up as an outpatient and did not explain her acute symptoms.  On reassessment the patient is not having any difficulty ambulating.  Her husband is now present reports she is completely back to baseline, not having any of her sluggishness, and she is not having any difficulty rapidly pacing in the hall.  I think they are stable for discharge and is comfortable with this plan.  Her husband did request a case management consultation regarding need for additional home health aides as the patient is needing more and more daily monitoring.  I did place this nonurgent consultation for tonight or tomorrow but she does not need to remain in the ED for this issue.       Terald Sleeper, MD 07/13/23 1910

## 2023-07-13 NOTE — ED Triage Notes (Signed)
Son is bringing in his mom today because his daughter was concerned with her behavior. He mentions that this morning she was lethargic, confused past her demented baseline, and stumbling around with an unsteady gait. She now presents alert, oriented to self only which is baseline per his report, and able to ambulate effectively. She expresses no pain complaints, and seems cheerful in triage. She is otherwise stable at this time.

## 2023-08-19 ENCOUNTER — Ambulatory Visit: Payer: No Typology Code available for payment source | Admitting: Physician Assistant

## 2023-08-19 ENCOUNTER — Encounter: Payer: Self-pay | Admitting: Physician Assistant

## 2023-08-19 VITALS — BP 140/80 | HR 91 | Resp 20 | Wt 110.0 lb

## 2023-08-19 DIAGNOSIS — G300 Alzheimer's disease with early onset: Secondary | ICD-10-CM

## 2023-08-19 DIAGNOSIS — F02818 Dementia in other diseases classified elsewhere, unspecified severity, with other behavioral disturbance: Secondary | ICD-10-CM

## 2023-08-19 NOTE — Progress Notes (Signed)
Assessment/Plan:   Dementia of unclear etiology with behavioral disturbance, likely due to Alzheimer's disease, early onset moderately to advanced.  Cheryl [name] is a very pleasant 63 y.o. 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  As recall, during her prior visit, the patient was unable to participate on her visit due to increased tiredness, and we were unable to perform her physical exam.  Patient is currently on memantine 10 mg twice daily.  To date, her PET scan and LP had not been yet been performed.  However, due to progression of the disease, this may no longer be indicated.  She needs 24/7 care.  She is unable to complete answers, very tangential, with inability to follow verbal instructions.  She is still very mobile, able to eat on her own and do other simple tasks by herself.  It is unclear if there is an underlying psychiatric issue as well.  A referral for psychiatric medication management had been placed during her prior visit, which, by now it could be beneficial for the management of mood changes, psychiatric medications, especially when she is not taking any thing oral, refusing to swallow meds.  Of note, on July 13, 2023, the patient presented to the ED with behavioral changes.  Workup was negative for stroke or UTI.  Of note, MRI of the brain, personally reviewed (07/13/2023), was remarkable for increased ventricular enlargement but neurology evaluated the patient, and did not deem these findings to be related to hydrocephalus.  Hippocampal and cerebral atrophy is noted.  Neurology also ruled out stroke or NPH.  Suspected toxic or metabolic causes in the setting of advanced dementia this patient is accompanied in the office by her daughter and  her husband.  Previous records as well as any outside records available were reviewed prior to todays visit.     Follow up in 6 months. Recommend discontinuing memantine 10 mg twice daily as this medication is no longer therapeutic, and risk outweighing the benefits of it.  Recommend 24/7 monitoring for safety. Awaiting psychiatric evaluation for medication management Recommend good control of cardiovascular risk factors Continue to control mood as per PCP   Update Any changes in memory since last visit? "Worse from day to day"-daughter says.  She does not participate in any activities, she is constantly pacing on the floor. Cannot complete a task. Likes to hoard containers, plastic fork, cups, and other plastic utensils, papers. repeats oneself?  Worse than before.  She talks unintelligible words.   Disoriented when walking into a room?  Routine is difficult, unable to continue schedule.    Leaving objects?  She has to be monitored, otherwise she may leave the stuff in unusual places. "Wants to put clothing in the microwave for 30 seconds" (her husband said that is always 30 seconds for anything that she does)  Wandering behavior?  She paces all day around the house, no wandering tendencies  personality changes since last visit?  "Very easy to get irritable".  She likes to use microwave and dryer.  She has sundowning episodes.  Fortunately, she has been either hiding the medications, not taking them for stealing them.  She was unable to take Depakote" there is no way that we can make her taking them, she is a nurse, we tried applesauce, crushing etc. "  Any worsening depression?:  Denies.  Does not seem to be aware of her mood changes Hallucinations or paranoia?  She does have auditory hallucinations, talking to people that are not there, referring them to "they".  She is  paranoid/defensive but not worse than before Seizures?  Denied.   Any sleep changes?Sleeps well. Unsure if she has vivid  dreams, but may talk in her sleep, REM behavior or sleepwalking   Sleep apnea? Denies.   Any hygiene concerns?  "She won't get in the shower or tub" . She need help with bathing, it is easier to place her on the tub than using the shower.  Mostly she takes birdbaths Independent of bathing and dressing?  "She may layer up" She needs assistance. Does the patient needs help with medications?  Daughter is in charge.  They also have HHN. Who is in charge of the finances?  Daughter is in charge. Any changes in appetite?  Denies.  "She is trying to be with her hands instead of using the utensils.  "      Patient have trouble swallowing?  Denies. Any headaches?  Denies Ambulates with difficulty?  Denies, she paces frequently Recent falls or head injuries?  Denies Unilateral weakness, numbness or tingling?  Denies Any tremors? Only when agitated . Any incontinence of urine?  Denies  Any bowel dysfunction? Denies Patient lives with  husband and her daughter    Patient is unable to participate in physical examination, does not follow commands, except decremation with RAM's and abnormal finger-to-nose, constantly moving her hands, do you know her nails, but unable to comprehend the instructions. CURRENT MEDICATIONS:  Outpatient Encounter Medications as of 08/19/2023  Medication Sig   amLODipine (NORVASC) 5 MG tablet Take 1 tablet (5 mg total) by mouth daily.   aspirin EC 81 MG tablet Take 1 tablet (81 mg total) by mouth daily. Swallow whole.   atorvastatin (LIPITOR) 80 MG tablet Take 1 tablet (80 mg total) by mouth at bedtime.   memantine (NAMENDA) 10 MG tablet Take 1 tablet (10 mg at night) for 2 weeks, then increase to 1 tablet (10 mg) twice a day   Vitamin D, Ergocalciferol, (DRISDOL) 1.25 MG (50000 UNIT) CAPS capsule Take 1 capsule (50,000 Units total) by mouth every 7 (seven) days for 12 doses.   Facility-Administered Encounter Medications as of 08/19/2023  Medication   gadopentetate dimeglumine  (MAGNEVIST) injection 9 mL        No data to display            10/29/2016    2:32 PM  Montreal Cognitive Assessment   Visuospatial/ Executive (0/5) 5  Naming (0/3) 3  Attention: Read list of digits (0/2) 2  Attention: Read list of letters (0/1) 1  Attention: Serial 7 subtraction starting at 100 (0/3) 1  Language: Repeat phrase (0/2) 2  Language : Fluency (0/1) 1  Abstraction (0/2) 2  Delayed Recall (0/5) 2  Orientation (0/6) 6  Total 25   Thank you for allowing [place] the opportunity to participate in the care of this nice patient. Please do not hesitate to contact us for any questions or concerns.   Total time spent on today's visit was 30 minutes dedicated to this patient today, preparing to see patient, examining the patient, ordering tests and/or medications and counseling the patient, documenting clinical information in the EHR or other health record, independently interpreting results and communicating results to the patient/family, discussing treatment and goals, answering patient's questions and coordinating care.  Cc:  Philip Aspen, Limmie Patricia, MD  Marlowe Kays 08/19/2023 6:29 AM

## 2023-08-19 NOTE — Patient Instructions (Signed)
It was a pleasure to see you today at our office.   Recommendations:  Follow up in July 18 at 11:30     Whom to call:  Memory  decline, memory medications: Call our office [phone]   For psychiatric meds, mood meds: Please have your primary care physician manage these medications.   Counseling regarding caregiver distress, including caregiver depression, anxiety and issues regarding community resources, adult day care programs, adult living facilities, or memory care questions:   Feel free to contact Misty Lisabeth Register, Social Worker at [phone]   For assessment of decision of mental capacity and competency:  Call Dr. Erick Blinks, geriatric psychiatrist at [phone]  For guidance in geriatric dementia issues please call [facility] [phone]  For guidance regarding [facility] and if placement were needed at the facility, contact Sidney Ace, Social Worker tel: [phone]  If you have any severe symptoms of a stroke, or other severe issues such as confusion,severe chills or fever, etc call 911 or go to the ER as you may need to be evaluated further   Feel free to visit Facebook page " [employer]" for tips of how to care for people with memory problems.     RECOMMENDATIONS FOR ALL PATIENTS WITH MEMORY PROBLEMS: 1. Continue to exercise (Recommend 30 minutes of walking everyday, or 3 hours every week) 2. Increase social interactions - continue going to [place] and enjoy social gatherings with friends and family 3. Eat healthy, avoid fried foods and eat more fruits and vegetables 4. Maintain adequate blood pressure, blood sugar, and blood cholesterol level. Reducing the risk of stroke and cardiovascular disease also helps promoting better memory. 5. Avoid stressful situations. Live a simple life and avoid aggravations. Organize your time and prepare for the next day in anticipation. 6. Sleep well, avoid any interruptions of sleep and  avoid any distractions in the bedroom that may interfere with adequate sleep quality 7. Avoid sugar, avoid sweets as there is a strong link between excessive sugar intake, diabetes, and cognitive impairment We discussed the Mediterranean diet, which has been shown to help patients reduce the risk of progressive memory disorders and reduces cardiovascular risk. This includes eating fish, eat fruits and green leafy vegetables, nuts like almonds and hazelnuts, walnuts, and also use olive oil. Avoid fast foods and fried foods as much as possible. Avoid sweets and sugar as sugar use has been linked to worsening of memory function.  There is always a concern of gradual progression of memory problems. If this is the case, then we may need to adjust level of care according to patient needs. Support, both to the patient and caregiver, should then be put into place.    The Alzheimer's Association is here all day, every day for people facing Alzheimer's disease through our free 24/7 Helpline: 8435189788. The Helpline provides reliable information and support to all those who need assistance, such as individuals living with memory loss, Alzheimer's or other dementia, caregivers, health care professionals and the public.  Our highly trained and knowledgeable staff can help you with: Understanding memory loss, dementia and Alzheimer's  Medications and other treatment options  General information about aging and brain health  Skills to provide quality care and to find the best care from professionals  Legal, financial and living-arrangement decisions Our Helpline also features: Confidential care consultation provided by master's level clinicians who can help with decision-making support, crisis assistance and education on issues families face every day  Help in a  caller's preferred language using our translation service that features more than 200 languages and dialects  Referrals to local community programs,  services and ongoing support     FALL PRECAUTIONS: Be cautious when walking. Scan the area for obstacles that may increase the risk of trips and falls. When getting up in the mornings, sit up at the edge of the bed for a few minutes before getting out of bed. Consider elevating the bed at the head end to avoid drop of blood pressure when getting up. Walk always in a well-lit room (use night lights in the walls). Avoid area rugs or power cords from appliances in the middle of the walkways. Use a walker or a cane if necessary and consider physical therapy for balance exercise. Get your eyesight checked regularly.  FINANCIAL OVERSIGHT: Supervision, especially oversight when making financial decisions or transactions is also recommended.  HOME SAFETY: Consider the safety of the kitchen when operating appliances like stoves, microwave oven, and blender. Consider having supervision and share cooking responsibilities until no longer able to participate in those. Accidents with firearms and other hazards in the house should be identified and addressed as well.   ABILITY TO BE LEFT ALONE: If patient is unable to contact 911 operator, consider using LifeLine, or when the need is there, arrange for someone to stay with patients. Smoking is a fire hazard, consider supervision or cessation. Risk of wandering should be assessed by caregiver and if detected at any point, supervision and safe proof recommendations should be instituted.  MEDICATION SUPERVISION: Inability to self-administer medication needs to be constantly addressed. Implement a mechanism to ensure safe administration of the medications.   DRIVING: Regarding driving, in patients with progressive memory problems, driving will be impaired. We advise to have someone else do the driving if trouble finding directions or if minor accidents are reported. Independent driving assessment is available to determine safety of driving.   If you are interested  in the driving assessment, you can contact the following:  [employer] in [place] [phone]  [facility] [phone]  [facility] [phone] or [phone]      Mediterranean Diet A Mediterranean diet refers to food and lifestyle choices that are based on the traditions of countries located on the [employer]. This way of eating has been shown to help prevent certain conditions and improve outcomes for people who have chronic diseases, like kidney disease and heart disease. What are tips for following this plan? Lifestyle  Cook and eat meals together with your family, when possible. Drink enough fluid to keep your urine clear or pale yellow. Be physically active every day. This includes: Aerobic exercise like running or swimming. Leisure activities like gardening, walking, or housework. Get 7-8 hours of sleep each night. If recommended by your health care provider, drink red wine in moderation. This means 1 glass a day for nonpregnant women and 2 glasses a day for men. A glass of wine equals 5 oz (150 mL). Reading food labels  Check the serving size of packaged foods. For foods such as rice and pasta, the serving size refers to the amount of cooked product, not dry. Check the total fat in packaged foods. Avoid foods that have saturated fat or trans fats. Check the ingredients list for added sugars, such as corn syrup. Shopping  At the grocery store, buy most of your food from the areas near the walls of the store. This includes: Fresh fruits and  vegetables (produce). Grains, beans, nuts, and seeds. Some of these may be available in unpackaged forms or large amounts (in bulk). Fresh seafood. Poultry and eggs. Low-fat dairy products. Buy whole ingredients instead of prepackaged foods. Buy fresh fruits and vegetables in-season from local farmers markets. Buy frozen fruits and vegetables in  resealable bags. If you do not have access to quality fresh seafood, buy precooked frozen shrimp or canned fish, such as tuna, salmon, or sardines. Buy small amounts of raw or cooked vegetables, salads, or olives from the deli or salad bar at your store. Stock your pantry so you always have certain foods on hand, such as olive oil, canned tuna, canned tomatoes, rice, pasta, and beans. Cooking  Cook foods with extra-virgin olive oil instead of using butter or other vegetable oils. Have meat as a side dish, and have vegetables or grains as your main dish. This means having meat in small portions or adding small amounts of meat to foods like pasta or stew. Use beans or vegetables instead of meat in common dishes like chili or lasagna. Experiment with different cooking methods. Try roasting or broiling vegetables instead of steaming or sauteing them. Add frozen vegetables to soups, stews, pasta, or rice. Add nuts or seeds for added healthy fat at each meal. You can add these to yogurt, salads, or vegetable dishes. Marinate fish or vegetables using olive oil, lemon juice, garlic, and fresh herbs. Meal planning  Plan to eat 1 vegetarian meal one day each week. Try to work up to 2 vegetarian meals, if possible. Eat seafood 2 or more times a week. Have healthy snacks readily available, such as: Vegetable sticks with hummus. Greek yogurt. Fruit and nut trail mix. Eat balanced meals throughout the week. This includes: Fruit: 2-3 servings a day Vegetables: 4-5 servings a day Low-fat dairy: 2 servings a day Fish, poultry, or lean meat: 1 serving a day Beans and legumes: 2 or more servings a week Nuts and seeds: 1-2 servings a day Whole grains: 6-8 servings a day Extra-virgin olive oil: 3-4 servings a day Limit red meat and sweets to only a few servings a month What are my food choices? Mediterranean diet Recommended Grains: Whole-grain pasta. Brown rice. Bulgar wheat. Polenta. Couscous.  Whole-wheat bread. Orpah Cobb. Vegetables: Artichokes. Beets. Broccoli. Cabbage. Carrots. Eggplant. Green beans. Chard. Kale. Spinach. Onions. Leeks. Peas. Squash. Tomatoes. Peppers. Radishes. Fruits: Apples. Apricots. Avocado. Berries. Bananas. Cherries. Dates. Figs. Grapes. Lemons. Melon. Oranges. Peaches. Plums. Pomegranate. Meats and other protein foods: Beans. Almonds. Sunflower seeds. Pine nuts. Peanuts. Cod. Salmon. Scallops. Shrimp. Tuna. Tilapia. Clams. Oysters. Eggs. Dairy: Low-fat milk. Cheese. Greek yogurt. Beverages: Water. Red wine. Herbal tea. Fats and oils: Extra virgin olive oil. Avocado oil. Grape seed oil. Sweets and desserts: [place] yogurt with honey. Baked apples. Poached pears. Trail mix. Seasoning and other foods: Basil. Cilantro. Coriander. Cumin. Mint. Parsley. Sage. Rosemary. Tarragon. Garlic. Oregano. Thyme. Pepper. Balsalmic vinegar. Tahini. Hummus. Tomato sauce. Olives. Mushrooms. Limit these Grains: Prepackaged pasta or rice dishes. Prepackaged cereal with added sugar. Vegetables: Deep fried potatoes (french fries). Fruits: Fruit canned in syrup. Meats and other protein foods: Beef. Pork. Lamb. Poultry with skin. Hot dogs. Tomasa Blase. Dairy: Ice cream. Sour cream. Whole milk. Beverages: Juice. Sugar-sweetened soft drinks. Beer. Liquor and spirits. Fats and oils: Butter. Canola oil. Vegetable oil. Beef fat (tallow). Lard. Sweets and desserts: Cookies. Cakes. Pies. Candy. Seasoning and other foods: Mayonnaise. Premade sauces and marinades. The items listed may not be a complete list. Talk with  your dietitian about what dietary choices are right for you. Summary The Mediterranean diet includes both food and lifestyle choices. Eat a variety of fresh fruits and vegetables, beans, nuts, seeds, and whole grains. Limit the amount of red meat and sweets that you eat. Talk with your health care provider about whether it is safe for you to drink red wine in moderation. This  means 1 glass a day for nonpregnant women and 2 glasses a day for men. A glass of wine equals 5 oz (150 mL). This information is not intended to replace advice given to you by your health care provider. Make sure you discuss any questions you have with your health care provider. Document Released: 03/12/2016 Document Revised: 04/14/2016 Document Reviewed: 03/12/2016 Elsevier Interactive Patient Education  2017 [employer].

## 2023-08-25 ENCOUNTER — Telehealth (HOSPITAL_COMMUNITY): Payer: Self-pay | Admitting: Internal Medicine

## 2023-09-07 ENCOUNTER — Telehealth: Payer: Self-pay | Admitting: Internal Medicine

## 2023-09-07 ENCOUNTER — Other Ambulatory Visit: Payer: Self-pay | Admitting: Internal Medicine

## 2023-09-07 DIAGNOSIS — F0280 Dementia in other diseases classified elsewhere without behavioral disturbance: Secondary | ICD-10-CM

## 2023-09-07 NOTE — Telephone Encounter (Signed)
 Copied from CRM [id number]. Topic: General - Other >> Sep 07, 2023  1:24 PM Leotis ORN wrote: Reason for CRM: patient's husband reginald Girton calling because he wants to have a child psychotherapist assigned to his wife- please call patients husband back at [phone]  he stated that this is his second call, he is wanting this due to patients alzheimers

## 2023-11-02 ENCOUNTER — Ambulatory Visit (HOSPITAL_COMMUNITY): Payer: No Typology Code available for payment source | Admitting: Psychiatry

## 2023-11-23 ENCOUNTER — Encounter: Payer: Self-pay | Admitting: Physician Assistant

## 2024-01-18 ENCOUNTER — Ambulatory Visit: Admitting: Physician Assistant

## 2024-01-18 ENCOUNTER — Encounter: Payer: Self-pay | Admitting: Physician Assistant

## 2024-01-18 VITALS — BP 143/72 | HR 77 | Resp 20 | Ht 63.0 in | Wt 108.0 lb

## 2024-01-18 DIAGNOSIS — F02818 Dementia in other diseases classified elsewhere, unspecified severity, with other behavioral disturbance: Secondary | ICD-10-CM

## 2024-01-18 DIAGNOSIS — G300 Alzheimer's disease with early onset: Secondary | ICD-10-CM

## 2024-01-18 MED ORDER — DIVALPROEX SODIUM 125 MG PO DR TAB: 125.0000 mg | DELAYED_RELEASE_TABLET | Freq: Every evening | ORAL | 3 refills | Status: AC

## 2024-01-18 NOTE — Progress Notes (Signed)
 Assessment/Plan:     Dementia of unclear etiology with behavioral disturbance, likely due to Alzheimer disease, early on, moderate to advanced  Cheryl [name] is a very pleasant 63 y.o. 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 She requires 24/7 care.  She is unable to complete answers, very tangential, unable to follow verbal instructions.  She is not on memantine , as this is no longer therapeutic.  Physically, she is very mobile, able to eat on her own and do simple tasks by herself.  It is unclear if there is a component of psychiatric disease as well. Discussed with her husband initiating Depakote 125 mg at night for sundowning episodes, husband agrees.      Follow up in  6 months. Recommend 24/7 monitoring for safety  Depakote 125 mg Take 1 tab at night , may increase to 1 tab twice a day if needed . Recommend good control of her cardiovascular risk factors     Subjective:    This patient is accompanied in the office by who supplements the history.  Previous records as well as any outside records available were reviewed prior to todays visit. Patient was last seen on 08/19/2023     Any changes in memory since last visit?  About the same-husband says. She has now a caregiver. She likes to watch her DVDs, and personifies the actors. Both LTM and STM are affected repeats oneself?  Endorsed Disoriented when walking into a room? She may not recognize her house sometimes   Leaving objects?Endorsed.  Pizza in the dryer, staff in the pillowcase, shoe, etc. She likes to collect fruit and chips in boxes.    Wandering behavior? 2 weeks ago went to a neighbor house. Has placed a camera now for safety  and HHN.   Any personality changes since last visit?  She gets irritated by any man, telling them. Get out of here.   Any worsening depression?:  Denies.   Hallucinations or paranoia? She talks to [facility] the same people though Seizures? denies    Any sleep changes?  She may pace some nights, and some nights she is on schedule-husband says.  Denies vivid dreams, REM behavior or sleepwalking   Sleep apnea?   Denies.   Any hygiene concerns? Caregiver helps her.  Independent of bathing and dressing?  Endorsed  Does the patient needs help with medications? Husband is in charge  Who is in charge of the finances? Husband  is in charge     Any changes in appetite?  denies   Patient have trouble swallowing? Denies.   Does the patient cook? No Any headaches?   denies   Any vision changes? Denies  Chronic back pain  denies   Ambulates with difficulty? Denies.    Recent falls or head injuries? Denies.     Unilateral weakness, numbness or tingling? Denies.   Any tremors?  Denies    Any anosmia?  Denies   Any incontinence of urine?  Endorsed   Any bowel dysfunction?   Denies      Patient lives with her husband. She has HHN now.    Does the patient drive? No longer drives     PREVIOUS MEDICATIONS:   CURRENT MEDICATIONS:  Outpatient Encounter Medications as of  01/18/2024  Medication Sig   divalproex (DEPAKOTE) 125 MG DR tablet Take 1 tablet (125 mg total) by mouth at bedtime.   amLODipine  (NORVASC ) 5 MG tablet Take 1 tablet (5 mg total) by mouth daily.   aspirin  EC 81 MG tablet Take 1 tablet (81 mg total) by mouth daily. Swallow whole.   atorvastatin  (LIPITOR) 80 MG tablet Take 1 tablet (80 mg total) by mouth at bedtime.   memantine  (NAMENDA ) 10 MG tablet Take 1 tablet (10 mg at night) for 2 weeks, then increase to 1 tablet (10 mg) twice a day   Facility-Administered Encounter Medications as of 01/18/2024  Medication   gadopentetate dimeglumine  (MAGNEVIST ) injection 9 mL        No data  to display            10/29/2016    2:32 PM  Montreal Cognitive Assessment   Visuospatial/ Executive (0/5) 5  Naming (0/3) 3  Attention: Read list of digits (0/2) 2  Attention: Read list of letters (0/1) 1  Attention: Serial 7 subtraction starting at 100 (0/3) 1  Language: Repeat phrase (0/2) 2  Language : Fluency (0/1) 1  Abstraction (0/2) 2  Delayed Recall (0/5) 2  Orientation (0/6) 6  Total 25    Objective:     PHYSICAL EXAMINATION:    VITALS:   Vitals:   01/18/24 1121 01/18/24 1128  BP: (!) 153/124 (!) 143/72  Pulse: 77   Resp: 20   SpO2: 98%   Weight: 108 lb (49 kg)   Height: 5' 3 (1.6 m)     Exam : Patient is unable to participate in physical examination, does not follow the commands, except determination with RAM's and abnormal finger-to-nose, constantly moving her hands       Thank you for allowing us  the opportunity to participate in the care of this nice patient. Please do not hesitate to contact us  for any questions or concerns.   Total time spent on today's visit was 24 minutes dedicated to this patient today, preparing to see patient, examining the patient, ordering tests and/or medications and counseling the patient, documenting clinical information in the EHR or other health record, independently interpreting results and communicating results to the patient/family, discussing treatment and goals, answering patient's questions and coordinating care.  Cc:  Zilphia Hilt, Charyl Coppersmith, MD  Tex Filbert 01/18/2024 12:38 PM

## 2024-01-18 NOTE — Patient Instructions (Addendum)
 It was a pleasure to see you today at our office.   Recommendations:  Follow up Jan 14 at 11:30  Start Depakote 125 mg at night     Whom to call:  Memory  decline, memory medications: Call our office [phone]   For psychiatric meds, mood meds: Please have your primary care physician manage these medications.   Counseling regarding caregiver distress, including caregiver depression, anxiety and issues regarding community resources, adult day care programs, adult living facilities, or memory care questions:   Feel free to contact Misty Court Distance, Social Worker at [phone]   For assessment of decision of mental capacity and competency:  Call Dr. Laverne Potter, geriatric psychiatrist at [phone]  For guidance in geriatric dementia issues please call [facility] [phone]  For guidance regarding [facility] and if placement were needed at the facility, contact Forrestine Ike, Social Worker tel: [phone]  If you have any severe symptoms of a stroke, or other severe issues such as confusion,severe chills or fever, etc call 911 or go to the ER as you may need to be evaluated further   Feel free to visit Facebook page  [employer] for tips of how to care for people with memory problems.     RECOMMENDATIONS FOR ALL PATIENTS WITH MEMORY PROBLEMS: 1. Continue to exercise (Recommend 30 minutes of walking everyday, or 3 hours every week) 2. Increase social interactions - continue going to [place] and enjoy social gatherings with friends and family 3. Eat healthy, avoid fried foods and eat more fruits and vegetables 4. Maintain adequate blood pressure, blood sugar, and blood cholesterol level. Reducing the risk of stroke and cardiovascular disease also helps promoting better memory. 5. Avoid stressful situations. Live a simple life and avoid aggravations. Organize your time and prepare for the next day in anticipation. 6. Sleep well, avoid  any interruptions of sleep and avoid any distractions in the bedroom that may interfere with adequate sleep quality 7. Avoid sugar, avoid sweets as there is a strong link between excessive sugar intake, diabetes, and cognitive impairment We discussed the Mediterranean diet, which has been shown to help patients reduce the risk of progressive memory disorders and reduces cardiovascular risk. This includes eating fish, eat fruits and green leafy vegetables, nuts like almonds and hazelnuts, walnuts, and also use olive oil. Avoid fast foods and fried foods as much as possible. Avoid sweets and sugar as sugar use has been linked to worsening of memory function.  There is always a concern of gradual progression of memory problems. If this is the case, then we may need to adjust level of care according to patient needs. Support, both to the patient and caregiver, should then be put into place.    The Alzheimer's Association is here all day, every day for people facing Alzheimer's disease through our free 24/7 Helpline: [phone]. The Helpline provides reliable information and support to all those who need assistance, such as individuals living with memory loss, Alzheimer's or other dementia, caregivers, health care professionals and the public.  Our highly trained and knowledgeable staff can help you with: Understanding memory loss, dementia and Alzheimer's  Medications and other treatment options  General information about aging and brain health  Skills to provide quality care and to find the best care from professionals  Legal, financial and living-arrangement decisions Our Helpline also features: Confidential care consultation provided by master's level clinicians who can help with decision-making support, crisis assistance and education on issues families face  every day  Help in a caller's preferred language using our translation service that features more than 200 languages and dialects  Referrals  to local community programs, services and ongoing support     FALL PRECAUTIONS: Be cautious when walking. Scan the area for obstacles that may increase the risk of trips and falls. When getting up in the mornings, sit up at the edge of the bed for a few minutes before getting out of bed. Consider elevating the bed at the head end to avoid drop of blood pressure when getting up. Walk always in a well-lit room (use night lights in the walls). Avoid area rugs or power cords from appliances in the middle of the walkways. Use a walker or a cane if necessary and consider physical therapy for balance exercise. Get your eyesight checked regularly.  FINANCIAL OVERSIGHT: Supervision, especially oversight when making financial decisions or transactions is also recommended.  HOME SAFETY: Consider the safety of the kitchen when operating appliances like stoves, microwave oven, and blender. Consider having supervision and share cooking responsibilities until no longer able to participate in those. Accidents with firearms and other hazards in the house should be identified and addressed as well.   ABILITY TO BE LEFT ALONE: If patient is unable to contact 911 operator, consider using LifeLine, or when the need is there, arrange for someone to stay with patients. Smoking is a fire hazard, consider supervision or cessation. Risk of wandering should be assessed by caregiver and if detected at any point, supervision and safe proof recommendations should be instituted.  MEDICATION SUPERVISION: Inability to self-administer medication needs to be constantly addressed. Implement a mechanism to ensure safe administration of the medications.   DRIVING: Regarding driving, in patients with progressive memory problems, driving will be impaired. We advise to have someone else do the driving if trouble finding directions or if minor accidents are reported. Independent driving assessment is available to determine safety of  driving.   If you are interested in the driving assessment, you can contact the following:  [employer] in [place] [phone]  [facility] [phone]  [facility] [phone] or [phone]      Mediterranean Diet A Mediterranean diet refers to food and lifestyle choices that are based on the traditions of countries located on the [employer]. This way of eating has been shown to help prevent certain conditions and improve outcomes for people who have chronic diseases, like kidney disease and heart disease. What are tips for following this plan? Lifestyle  Cook and eat meals together with your family, when possible. Drink enough fluid to keep your urine clear or pale yellow. Be physically active every day. This includes: Aerobic exercise like running or swimming. Leisure activities like gardening, walking, or housework. Get 7-8 hours of sleep each night. If recommended by your health care provider, drink red wine in moderation. This means 1 glass a day for nonpregnant women and 2 glasses a day for men. A glass of wine equals 5 oz (150 mL). Reading food labels  Check the serving size of packaged foods. For foods such as rice and pasta, the serving size refers to the amount of cooked product, not dry. Check the total fat in packaged foods. Avoid foods that have saturated fat or trans fats. Check the ingredients list for added sugars, such as corn syrup. Shopping  At the grocery store, buy most of your food from the areas near the walls of the  store. This includes: Fresh fruits and vegetables (produce). Grains, beans, nuts, and seeds. Some of these may be available in unpackaged forms or large amounts (in bulk). Fresh seafood. Poultry and eggs. Low-fat dairy products. Buy whole ingredients instead of prepackaged foods. Buy fresh fruits and vegetables in-season from local farmers markets. Buy  frozen fruits and vegetables in resealable bags. If you do not have access to quality fresh seafood, buy precooked frozen shrimp or canned fish, such as tuna, salmon, or sardines. Buy small amounts of raw or cooked vegetables, salads, or olives from the deli or salad bar at your store. Stock your pantry so you always have certain foods on hand, such as olive oil, canned tuna, canned tomatoes, rice, pasta, and beans. Cooking  Cook foods with extra-virgin olive oil instead of using butter or other vegetable oils. Have meat as a side dish, and have vegetables or grains as your main dish. This means having meat in small portions or adding small amounts of meat to foods like pasta or stew. Use beans or vegetables instead of meat in common dishes like chili or lasagna. Experiment with different cooking methods. Try roasting or broiling vegetables instead of steaming or sauteing them. Add frozen vegetables to soups, stews, pasta, or rice. Add nuts or seeds for added healthy fat at each meal. You can add these to yogurt, salads, or vegetable dishes. Marinate fish or vegetables using olive oil, lemon juice, garlic, and fresh herbs. Meal planning  Plan to eat 1 vegetarian meal one day each week. Try to work up to 2 vegetarian meals, if possible. Eat seafood 2 or more times a week. Have healthy snacks readily available, such as: Vegetable sticks with hummus. Greek yogurt. Fruit and nut trail mix. Eat balanced meals throughout the week. This includes: Fruit: 2-3 servings a day Vegetables: 4-5 servings a day Low-fat dairy: 2 servings a day Fish, poultry, or lean meat: 1 serving a day Beans and legumes: 2 or more servings a week Nuts and seeds: 1-2 servings a day Whole grains: 6-8 servings a day Extra-virgin olive oil: 3-4 servings a day Limit red meat and sweets to only a few servings a month What are my food choices? Mediterranean diet Recommended Grains: Whole-grain pasta. Brown rice. Bulgar  wheat. Polenta. Couscous. Whole-wheat bread. Dwyane Glad. Vegetables: Artichokes. Beets. Broccoli. Cabbage. Carrots. Eggplant. Green beans. Chard. Kale. Spinach. Onions. Leeks. Peas. Squash. Tomatoes. Peppers. Radishes. Fruits: Apples. Apricots. Avocado. Berries. Bananas. Cherries. Dates. Figs. Grapes. Lemons. Melon. Oranges. Peaches. Plums. Pomegranate. Meats and other protein foods: Beans. Almonds. Sunflower seeds. Pine nuts. Peanuts. Cod. Salmon. Scallops. Shrimp. Tuna. Tilapia. Clams. Oysters. Eggs. Dairy: Low-fat milk. Cheese. Greek yogurt. Beverages: Water. Red wine. Herbal tea. Fats and oils: Extra virgin olive oil. Avocado oil. Grape seed oil. Sweets and desserts: [place] yogurt with honey. Baked apples. Poached pears. Trail mix. Seasoning and other foods: Basil. Cilantro. Coriander. Cumin. Mint. Parsley. Sage. Rosemary. Tarragon. Garlic. Oregano. Thyme. Pepper. Balsalmic vinegar. Tahini. Hummus. Tomato sauce. Olives. Mushrooms. Limit these Grains: Prepackaged pasta or rice dishes. Prepackaged cereal with added sugar. Vegetables: Deep fried potatoes (french fries). Fruits: Fruit canned in syrup. Meats and other protein foods: Beef. Pork. Lamb. Poultry with skin. Hot dogs. Helene Loader. Dairy: Ice cream. Sour cream. Whole milk. Beverages: Juice. Sugar-sweetened soft drinks. Beer. Liquor and spirits. Fats and oils: Butter. Canola oil. Vegetable oil. Beef fat (tallow). Lard. Sweets and desserts: Cookies. Cakes. Pies. Candy. Seasoning and other foods: Mayonnaise. Premade sauces and marinades. The items listed may not  be a complete list. Talk with your dietitian about what dietary choices are right for you. Summary The Mediterranean diet includes both food and lifestyle choices. Eat a variety of fresh fruits and vegetables, beans, nuts, seeds, and whole grains. Limit the amount of red meat and sweets that you eat. Talk with your health care provider about whether it is safe for you to drink red  wine in moderation. This means 1 glass a day for nonpregnant women and 2 glasses a day for men. A glass of wine equals 5 oz (150 mL). This information is not intended to replace advice given to you by your health care provider. Make sure you discuss any questions you have with your health care provider. Document Released: 03/12/2016 Document Revised: 04/14/2016 Document Reviewed: 03/12/2016 Elsevier Interactive Patient Education  2017 [employer].

## 2024-01-21 ENCOUNTER — Ambulatory Visit: Admitting: Physician Assistant

## 2024-02-01 ENCOUNTER — Ambulatory Visit: Admitting: Internal Medicine

## 2024-02-08 ENCOUNTER — Encounter: Payer: Self-pay | Admitting: Internal Medicine

## 2024-02-08 ENCOUNTER — Ambulatory Visit: Admitting: Internal Medicine

## 2024-02-08 VITALS — BP 128/78 | HR 76 | Temp 98.1°F | Wt 108.4 lb

## 2024-02-08 DIAGNOSIS — E785 Hyperlipidemia, unspecified: Secondary | ICD-10-CM

## 2024-02-08 DIAGNOSIS — R7302 Impaired glucose tolerance (oral): Secondary | ICD-10-CM | POA: Diagnosis not present

## 2024-02-08 DIAGNOSIS — I10 Essential (primary) hypertension: Secondary | ICD-10-CM | POA: Diagnosis not present

## 2024-02-08 DIAGNOSIS — G3109 Other frontotemporal dementia: Secondary | ICD-10-CM

## 2024-02-08 DIAGNOSIS — E559 Vitamin D deficiency, unspecified: Secondary | ICD-10-CM

## 2024-02-08 DIAGNOSIS — F0280 Dementia in other diseases classified elsewhere without behavioral disturbance: Secondary | ICD-10-CM

## 2024-02-08 LAB — COMPREHENSIVE METABOLIC PANEL WITH GFR
ALT: 16 U/L (ref 0–35)
AST: 23 U/L (ref 0–37)
Albumin: 4.4 g/dL (ref 3.5–5.2)
Alkaline Phosphatase: 95 U/L (ref 39–117)
BUN: 18 mg/dL (ref 6–23)
CO2: 28 meq/L (ref 19–32)
Calcium: 9.6 mg/dL (ref 8.4–10.5)
Chloride: 107 meq/L (ref 96–112)
Creatinine, Ser: 0.9 mg/dL (ref 0.40–1.20)
GFR: 67.88 mL/min (ref >60.00)
Glucose, Bld: 80 mg/dL (ref 70–99)
Potassium: 3.8 meq/L (ref 3.5–5.1)
Sodium: 144 meq/L (ref 135–145)
Total Bilirubin: 0.3 mg/dL (ref 0.2–1.2)
Total Protein: 7.1 g/dL (ref 6.0–8.3)

## 2024-02-08 LAB — CBC WITH DIFFERENTIAL/PLATELET
Basophils Absolute: 0 K/uL (ref 0.0–0.1)
Basophils Relative: 0.6 % (ref 0.0–3.0)
Eosinophils Absolute: 0.1 K/uL (ref 0.0–0.7)
Eosinophils Relative: 1.6 % (ref 0.0–5.0)
HCT: 40 % (ref 36.0–46.0)
Hemoglobin: 13.1 g/dL (ref 12.0–15.0)
Lymphocytes Relative: 19.4 % (ref 12.0–46.0)
Lymphs Abs: 1.2 K/uL (ref 0.7–4.0)
MCHC: 32.7 g/dL (ref 30.0–36.0)
MCV: 86.1 fl (ref 78.0–100.0)
Monocytes Absolute: 0.5 K/uL (ref 0.1–1.0)
Monocytes Relative: 8.8 % (ref 3.0–12.0)
Neutro Abs: 4.1 K/uL (ref 1.4–7.7)
Neutrophils Relative %: 69.6 % (ref 43.0–77.0)
Platelets: 218 K/uL (ref 150.0–400.0)
RBC: 4.65 Mil/uL (ref 3.87–5.11)
RDW: 14.5 % (ref 11.5–15.5)
WBC: 6 K/uL (ref 4.0–10.5)

## 2024-02-08 LAB — LIPID PANEL
Cholesterol: 228 mg/dL — ABNORMAL HIGH (ref 0–200)
HDL: 68.6 mg/dL (ref >39.00)
LDL Cholesterol: 139 mg/dL — ABNORMAL HIGH (ref 0–99)
NonHDL: 158.99
Total CHOL/HDL Ratio: 3
Triglycerides: 98 mg/dL (ref 0.0–149.0)
VLDL: 19.6 mg/dL (ref 0.0–40.0)

## 2024-02-08 LAB — POCT GLYCOSYLATED HEMOGLOBIN (HGB A1C): Hemoglobin A1C: 5.6 % (ref 4.0–5.6)

## 2024-02-08 LAB — VITAMIN D 25 HYDROXY (VIT D DEFICIENCY, FRACTURES): VITD: 12.63 ng/mL — ABNORMAL LOW (ref 30.00–100.00)

## 2024-02-08 MED ORDER — AMLODIPINE BESYLATE 5 MG PO TABS: 5.0000 mg | ORAL_TABLET | Freq: Every day | ORAL | 1 refills | Status: AC

## 2024-02-08 MED ORDER — ATORVASTATIN CALCIUM 80 MG PO TABS: 80.0000 mg | ORAL_TABLET | Freq: Every day | ORAL | 1 refills | Status: DC

## 2024-02-08 NOTE — Progress Notes (Signed)
 Established Patient Office Visit     CC/Reason for Visit: Follow-up chronic conditions  HPI: Journey Castonguay is a 63 y.o. female who is coming in today for the above mentioned reasons. Past Medical History is significant for: Moderate to severe frontal lobe dementia, hypertension, hyperlipidemia.  Here today with husband and caregiver.  She has not been taking her medication because she hides them.    She is due for lab work.   Past Medical/Surgical History: Past Medical History:  Diagnosis Date   Arthritis    Bursitis of left hip    Dysphagia    Esophagitis    Fibromuscular dysplasia (HCC)    GERD (gastroesophageal reflux disease)    Hemorrhoids    Hyperlipemia    Hypertension    Iron deficiency anemia    Personal history of colonic polyps    Adenomatous    Stroke (HCC)    TIA (transient ischemic attack)     Past Surgical History:  Procedure Laterality Date   CESAREAN SECTION     x 2    NASOSEPTOPLASTY     TEMPOROMANDIBULAR JOINT SURGERY  1989    Social History:  reports that she has never smoked. She has never used smokeless tobacco. She reports that she does not drink alcohol and does not use drugs.  Allergies: Allergies  Allergen Reactions   Cefazolin Hives and Itching   Sulfonamide Derivatives Hives and Itching    Family History:  Family History  Problem Relation Age of Onset   Prostate cancer Father    Cancer Father        Laryngeal   Hypertension Mother    Hyperlipidemia Mother    Other Mother        varicose veins   Stroke Mother    Stroke Maternal Grandmother    Diabetes Maternal Aunt    Colon cancer Neg Hx      Current Outpatient Medications:    amLODipine  (NORVASC ) 5 MG tablet, Take 1 tablet (5 mg total) by mouth daily., Disp: 90 tablet, Rfl: 1   aspirin  EC 81 MG tablet, Take 1 tablet (81 mg total) by mouth daily. Swallow whole. (Patient not taking: Reported on 02/08/2024), Disp: 30 tablet, Rfl: 11   atorvastatin  (LIPITOR) 80 MG  tablet, Take 1 tablet (80 mg total) by mouth at bedtime., Disp: 90 tablet, Rfl: 1   divalproex  (DEPAKOTE ) 125 MG DR tablet, Take 1 tablet (125 mg total) by mouth at bedtime. (Patient not taking: Reported on 02/08/2024), Disp: 90 tablet, Rfl: 3   memantine  (NAMENDA ) 10 MG tablet, Take 1 tablet (10 mg at night) for 2 weeks, then increase to 1 tablet (10 mg) twice a day (Patient not taking: Reported on 02/08/2024), Disp: 60 tablet, Rfl: 11 No current facility-administered medications for this visit.  Facility-Administered Medications Ordered in Other Visits:    gadopentetate dimeglumine  (MAGNEVIST ) injection 9 mL, 9 mL, Intravenous, Once PRN, Sethi, Pramod S, MD  Review of Systems:  Negative unless indicated in HPI.   Physical Exam: Vitals:   02/08/24 1326  BP: 128/78  Pulse: 76  Temp: 98.1 F (36.7 C)  TempSrc: Oral  SpO2: 98%  Weight: 108 lb 6.4 oz (49.2 kg)    Body mass index is 19.2 kg/m.   Physical Exam   Impression and Plan:  IGT (impaired glucose tolerance) -     POCT glycosylated hemoglobin (Hb A1C)  Frontal lobe dementia (HCC) -     AMB Referral [facility] Care Management  Essential  hypertension -     amLODIPine  Besylate; Take 1 tablet (5 mg total) by mouth daily.  Dispense: 90 tablet; Refill: 1 -     CBC with Differential/Platelet; Future -     Comprehensive metabolic panel with GFR; Future  Dyslipidemia -     Atorvastatin  Calcium ; Take 1 tablet (80 mg total) by mouth at bedtime.  Dispense: 90 tablet; Refill: 1 -     Lipid panel; Future   - A1c remains stable in the prediabetic range at 5.6. - Blood pressure is well-controlled. - Weight remains stable. - Check labs today. - Will place referral to social worker per request from husband given her advancing dementia for resources and caregiver support. - We discussed getting a safe and putting medication in there under lock and key.  Time spent:30 minutes reviewing chart, interviewing and examining patient and  formulating plan of care.     Tully Theophilus Andrews, MD  Primary Care at [facility]

## 2024-02-09 ENCOUNTER — Telehealth: Payer: Self-pay | Admitting: *Deleted

## 2024-02-09 ENCOUNTER — Ambulatory Visit: Payer: Self-pay | Admitting: Internal Medicine

## 2024-02-09 DIAGNOSIS — E559 Vitamin D deficiency, unspecified: Secondary | ICD-10-CM

## 2024-02-09 MED ORDER — VITAMIN D (ERGOCALCIFEROL) 1.25 MG (50000 UNIT) PO CAPS: 50000.0000 [IU] | ORAL_CAPSULE | ORAL | 0 refills | Status: AC

## 2024-02-09 NOTE — Progress Notes (Signed)
 Complex Care Management Note  Care Guide Note 02/09/2024 Name: Cheryl [name] MRN: [id number] DOB: [birth year]  Cheryl [name] is a 63 y.o. year old female who sees Theophilus Andrews, Tully GRADE, MD for primary care. I reached out to Cheryl [name] by phone today to offer complex care management services.  Cheryl [name] was given information about Complex Care Management services today including:   The Complex Care Management services include support from the care team which includes your Nurse Care Manager, Clinical Social Worker, or Pharmacist.  The Complex Care Management team is here to help remove barriers to the health concerns and goals most important to you. Complex Care Management services are voluntary, and the patient may decline or stop services at any time by request to their care team member.   Complex Care Management Consent Status: Patient agreed to services and verbal consent obtained.   Follow up plan:  Telephone appointment with complex care management team member scheduled for:  02/15/2024  Encounter Outcome:  Patient Scheduled  Thedford Franks, CMA [place]  [facility], [facility] Guide Direct Dial: [phone]  Fax: [phone] Website: [email or web address]

## 2024-02-15 ENCOUNTER — Other Ambulatory Visit: Payer: Self-pay | Admitting: Licensed Clinical Social Worker

## 2024-02-16 NOTE — Patient Outreach (Signed)
 Complex Care Management   Visit Note  02/16/2024  Name:  Cheryl [name] MRN: [id number] DOB: 01-12-1961  Situation: Referral received for Complex Care Management related to Dementia I obtained verbal consent from Caregiver.  Visit completed with caregiver  on the phone  Background:   Past Medical History:  Diagnosis Date   Arthritis    Bursitis of left hip    Dysphagia    Esophagitis    Fibromuscular dysplasia (HCC)    GERD (gastroesophageal reflux disease)    Hemorrhoids    Hyperlipemia    Hypertension    Iron deficiency anemia    Personal history of colonic polyps    Adenomatous    Stroke (HCC)    TIA (transient ischemic attack)     Assessment: Patient Reported Symptoms:  Cognitive Cognitive Status: Able to follow simple commands, Difficulties with attention and concentration, Struggling with memory recall Cognitive/Intellectual Conditions Management [RPT]: None reported or documented in medical history or problem list   Health Maintenance Behaviors: Stress management Healing Pattern: Unsure Health Facilitated by: Stress management  Neurological Neurological Review of Symptoms: Other: Oher Neurological Symptoms/Conditions [RPT]: [place] Dementia Neurological Management Strategies: Routine screening Neurological Self-Management Outcome: 3 (uncertain) Neurological Comment: Being followed by Neurology  HEENT HEENT Symptoms Reported: No symptoms reported      Cardiovascular Cardiovascular Symptoms Reported: No symptoms reported    Respiratory Respiratory Symptoms Reported: No symptoms reported    Endocrine Endocrine Symptoms Reported: No symptoms reported    Gastrointestinal Gastrointestinal Symptoms Reported: No symptoms reported      Genitourinary Genitourinary Symptoms Reported: No symptoms reported    Integumentary Integumentary Symptoms Reported: No symptoms reported    Musculoskeletal Musculoskelatal Symptoms Reviewed: No symptoms reported         Psychosocial Psychosocial Symptoms Reported: No symptoms reported Additional Psychological Details: Caregiver denied any behaviors or concerns   Techniques to Cope with Loss/Stress/Change: Diversional activities, Medication Quality of Family Relationships: helpful, involved, supportive Do you feel physically threatened by others?: No      02/08/2024    2:01 PM  Depression screen PHQ 2/9  Altered sleeping 2  Tired, decreased energy 2  Change in appetite 1  Trouble concentrating 1  Moving slowly or fidgety/restless 1    There were no vitals filed for this visit.  Medications Reviewed Today     Reviewed by Kit Alm LABOR, LCSW (Social Worker) on 02/15/24 at 1441  Med List Status: <None>   Medication Order Taking? Sig Documenting Provider Last Dose Status Informant  amLODipine  (NORVASC ) 5 MG tablet [id number]  Take 1 tablet (5 mg total) by mouth daily. Theophilus Andrews, Tully GRADE, MD  Active   aspirin  EC 81 MG tablet [id number]  Take 1 tablet (81 mg total) by mouth daily. Swallow whole.  Patient not taking: Reported on 02/15/2024   Theophilus Andrews, Tully GRADE, MD  Active            Med Note (CRUTHIS, CHLOE C   Tue Jul 13, 2023  2:20 PM)    atorvastatin  (LIPITOR) 80 MG tablet [id number]  Take 1 tablet (80 mg total) by mouth at bedtime. Theophilus Andrews, Tully GRADE, MD  Active   divalproex  (DEPAKOTE ) 125 MG DR tablet [id number]  Take 1 tablet (125 mg total) by mouth at bedtime.  Patient not taking: Reported on 02/15/2024   Wertman, Sara E, PA-C  Active   gadopentetate dimeglumine  (MAGNEVIST ) injection 9 mL [id number]   Rosemarie Eather RAMAN, MD  Active   memantine  (NAMENDA ) 10  MG tablet [id number]  Take 1 tablet (10 mg at night) for 2 weeks, then increase to 1 tablet (10 mg) twice a day  Patient not taking: Reported on 02/15/2024   Theophilus Andrews, Tully GRADE, MD  Active   Vitamin D , Ergocalciferol , (DRISDOL ) 1.25 MG (50000 UNIT) CAPS capsule [id number]  Take 1 capsule (50,000 Units total) by mouth  every 7 (seven) days for 12 doses. Theophilus Andrews, Tully GRADE, MD  Active             Recommendation:   Current level of care: Home with other family or significant other(s): spouse  Evaluation of patient safety in current living environment and review of Dementia resources and support (family supports and private home aids) ADL's Assessed needs, level of care concerns, how currently meeting needs and barriers to care Discussed private pay options for personal care needs (Currently receiving Private Pay)  Follow Up Plan:   Telephone follow up appointment date/time:  03/08/2024  Alm Armor, LCSW [place], [facility] Licensed Clinical Social Worker Care Coordinator [phone]

## 2024-02-18 ENCOUNTER — Ambulatory Visit: Payer: No Typology Code available for payment source | Admitting: Physician Assistant

## 2024-03-02 ENCOUNTER — Ambulatory Visit: Admitting: Physician Assistant

## 2024-03-08 ENCOUNTER — Other Ambulatory Visit: Payer: Self-pay | Admitting: Licensed Clinical Social Worker

## 2024-03-08 NOTE — Patient Instructions (Signed)
 Visit Information  Thank you for taking time to visit with me today. Please don't hesitate to contact me if I can be of assistance to you before our next scheduled appointment.  Your next care management appointment is by telephone on 04/07/2024   Please call the care guide team at [phone] if you need to cancel, schedule, or reschedule an appointment.   Please call the Suicide and Crisis Lifeline: 988 if you are experiencing a Mental Health or Behavioral Health Crisis or need someone to talk to.  Alm Armor, LCSW [place], [facility] Licensed Clinical Social Worker Care Coordinator [phone]

## 2024-03-08 NOTE — Patient Outreach (Signed)
 Complex Care Management   Visit Note  03/08/2024  Name:  Cheryl [name] MRN: [id number] DOB: 02-06-1961  Situation: Referral received for Complex Care Management related to Dementia I obtained verbal consent from Caregiver.  Visit completed with caregiver  on the phone  Background:   Past Medical History:  Diagnosis Date   Arthritis    Bursitis of left hip    Dysphagia    Esophagitis    Fibromuscular dysplasia (HCC)    GERD (gastroesophageal reflux disease)    Hemorrhoids    Hyperlipemia    Hypertension    Iron deficiency anemia    Personal history of colonic polyps    Adenomatous    Stroke (HCC)    TIA (transient ischemic attack)     Assessment: Patient Reported Symptoms:  Cognitive Cognitive Status: Able to follow simple commands, Difficulties with attention and concentration, Struggling with memory recall Cognitive/Intellectual Conditions Management [RPT]: None reported or documented in medical history or problem list   Health Maintenance Behaviors: Stress management Healing Pattern: Unsure Health Facilitated by: Stress management  Neurological Neurological Review of Symptoms: Other: Oher Neurological Symptoms/Conditions [RPT]: [place] Dementia Neurological Management Strategies: Routine screening Neurological Self-Management Outcome: 3 (uncertain) Neurological Comment: Folled by Neurology  HEENT HEENT Symptoms Reported: No symptoms reported      Cardiovascular Cardiovascular Symptoms Reported: No symptoms reported    Respiratory Respiratory Symptoms Reported: No symptoms reported    Endocrine Endocrine Symptoms Reported: No symptoms reported    Gastrointestinal Gastrointestinal Symptoms Reported: No symptoms reported      Genitourinary Genitourinary Symptoms Reported: No symptoms reported    Integumentary Integumentary Symptoms Reported: No symptoms reported    Musculoskeletal Musculoskelatal Symptoms Reviewed: No symptoms reported         Psychosocial Psychosocial Symptoms Reported: No symptoms reported Additional Psychological Details: Caregiver denied any behavior concerns   Techniques to Cope with Loss/Stress/Change: Diversional activities, Medication Quality of Family Relationships: helpful, involved, supportive Do you feel physically threatened by others?: No      02/08/2024    2:01 PM  Depression screen PHQ 2/9  Altered sleeping 2  Tired, decreased energy 2  Change in appetite 1  Trouble concentrating 1  Moving slowly or fidgety/restless 1    There were no vitals filed for this visit.  Medications Reviewed Today   Medications were not reviewed in this encounter     Recommendation:   Spouse reports he has enlisted help from family and they are visiting more often which is helping with his overall caregiver stress. Spouse will be starting work soon and is hopeful the distractions will help with his stress levels as well. Caregiver received community resources sent in previous contact but has not reviewed them yet.   Follow Up Plan:   Telephone follow up appointment date/time:  04/07/2024  Alm Armor, LCSW [place], [facility] Licensed Clinical Social Worker Care Coordinator [phone]

## 2024-04-07 ENCOUNTER — Telehealth: Payer: Self-pay | Admitting: Licensed Clinical Social Worker

## 2024-04-07 ENCOUNTER — Encounter: Payer: Self-pay | Admitting: Licensed Clinical Social Worker

## 2024-04-17 ENCOUNTER — Telehealth: Payer: Self-pay | Admitting: Licensed Clinical Social Worker

## 2024-05-04 ENCOUNTER — Encounter: Payer: Self-pay | Admitting: Licensed Clinical Social Worker

## 2024-05-16 ENCOUNTER — Telehealth: Payer: Self-pay | Admitting: Internal Medicine

## 2024-05-16 ENCOUNTER — Other Ambulatory Visit

## 2024-05-16 DIAGNOSIS — E559 Vitamin D deficiency, unspecified: Secondary | ICD-10-CM

## 2024-05-16 LAB — VITAMIN D 25 HYDROXY (VIT D DEFICIENCY, FRACTURES): VITD: 28.39 ng/mL — ABNORMAL LOW (ref 30.00–100.00)

## 2024-05-16 NOTE — Telephone Encounter (Signed)
 Patient and husband was wanting a call back--they thought she was seeing Dr. Theophilus today.  She just has a question for her nurse.

## 2024-05-17 ENCOUNTER — Telehealth: Payer: Self-pay

## 2024-05-17 ENCOUNTER — Ambulatory Visit: Payer: Self-pay | Admitting: Internal Medicine

## 2024-05-17 DIAGNOSIS — E559 Vitamin D deficiency, unspecified: Secondary | ICD-10-CM

## 2024-05-17 MED ORDER — VITAMIN D (ERGOCALCIFEROL) 1.25 MG (50000 UNIT) PO CAPS: 50000.0000 [IU] | ORAL_CAPSULE | ORAL | 0 refills | Status: AC

## 2024-05-17 NOTE — Telephone Encounter (Signed)
 Patient asking for a sedative to get patient to shower. States patient will not shower and does not know what to try to get her too.

## 2024-05-22 NOTE — Telephone Encounter (Signed)
 Husband is aware.

## 2024-05-22 NOTE — Telephone Encounter (Signed)
 Husband states that the patient will no longer take a shower.  He would like to know if there is any medication or therapy that could help her not be as non compliant?

## 2024-08-15 NOTE — Progress Notes (Incomplete)
 "   Dementia of unclear etiology with behavioral disturbance likely due to Alzheimer's disease, elderly onset, moderate to advanced   Cheryl [name] is a very pleasant 64 y.o. 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  She is no longer on antidementia medications given the progression of the disease.  She requires 24/7 care.  She is unable to complete answers, she is very tangential, unable to follow verbal instructions.  She still able to eat by herself and do simple tasks, able to walk.  For mood she is on Depakote  125 mg nightly to increase to 1 tab twice daily if needed with better control of her mood and episodes.  She was last seen on 01/18/2024.  Unfortunately, progression of the disease is noticed, at this point, neurology will not be able to provide any further therapies to this nice patient.  We discussed with her husband the role of memory care for safety, social and cognitive stimulation***.     Recommend good control of cardiovascular risk factors.   Continue to control mood as per PCP Continue Depakote  125 mg nightly may increase to 125 mg twice daily for sundowning episodes   Discussed the use of AI scribe software for clinical note transcription with the patient, who gave verbal consent to proceed.  History of Present Illness   Any changes in memory since last visit?  About the same-husband says. She has now a caregiver. She likes to watch her DVDs, and personifies the actors. Both LTM and STM are affected repeats oneself?  Endorsed Disoriented when walking into a room? She may not recognize her house sometimes   Leaving objects?Endorsed.  Pizza in the dryer, staff in the pillowcase, shoe, etc. She likes to collect fruit and chips in boxes.     Wandering behavior? 2 weeks ago went to a neighbor house. Has placed a camera now for safety  and HHN.  Any personality changes since last visit?  She gets irritated by any man, telling them. Get out of here.   Any worsening depression?:  Denies.   Hallucinations or paranoia? She talks to [facility] the same people though Seizures? denies    Any sleep changes?  She may pace some nights, and some nights she is on schedule-husband says.  Denies vivid dreams, REM behavior or sleepwalking   Sleep apnea?   Denies.   Any hygiene concerns? Caregiver helps her.  Independent of bathing and dressing?  Endorsed  Does the patient needs help with medications? Husband is in charge  Who is in charge of the finances? Husband  is in charge     Any changes in appetite?  denies   Patient have trouble swallowing? Denies.   Does the patient cook? No Any headaches?   denies   Any vision changes? Denies  Chronic back pain  denies   Ambulates with difficulty? Denies.    Recent falls or head injuries? Denies.     Unilateral weakness, numbness or tingling? Denies.   Any tremors?  Denies    Any anosmia?  Denies   Any incontinence of urine?  Endorsed   Any bowel dysfunction?   Denies      Patient lives with her husband. She has HHN now.    Does the patient drive? No longer drives  No data to display            10/29/2016    2:32 PM  Montreal Cognitive Assessment   Visuospatial/ Executive (0/5) 5  Naming (0/3) 3  Attention: Read list of digits (0/2) 2  Attention: Read list of letters (0/1) 1  Attention: Serial 7 subtraction starting at 100 (0/3) 1  Language: Repeat phrase (0/2) 2  Language : Fluency (0/1) 1  Abstraction (0/2) 2  Delayed Recall (0/5) 2  Orientation (0/6) 6  Total 25      Objective:    Neurological Exam:    VITALS:  There were no vitals filed for this visit.  GEN:  The patient appears stated age and is in NAD. HEENT:  Normocephalic, atraumatic.    Neurological examination:  She is unable to participate on her physical exam, does not follow the commands as she does not comprehend instructions, except decremation with RAMS, abnormal finger-to-nose, constantly moving her hands.      Thank you for allowing us  the opportunity to participate in the care of this nice patient. Please do not hesitate to contact us  for any questions or concerns.   Total time spent on today's visit was *** minutes dedicated to this patient today, preparing to see patient, examining the patient, ordering tests and/or medications and counseling the patient, documenting clinical information in the EHR or other health record, independently interpreting results and communicating results to the patient/family, discussing treatment and goals, answering patient's questions and coordinating care.  Cc:  Theophilus Andrews, Tully GRADE, MD  Camie Sevin 08/15/2024 5:48 AM      "

## 2024-08-16 ENCOUNTER — Ambulatory Visit: Admitting: Physician Assistant

## 2024-08-30 ENCOUNTER — Encounter: Payer: Self-pay | Admitting: Internal Medicine

## 2024-08-30 ENCOUNTER — Ambulatory Visit: Admitting: Internal Medicine

## 2024-08-30 VITALS — BP 130/84 | HR 94 | Temp 98.0°F | Ht 61.0 in | Wt 107.7 lb

## 2024-08-30 DIAGNOSIS — R7302 Impaired glucose tolerance (oral): Secondary | ICD-10-CM

## 2024-08-30 DIAGNOSIS — Z0000 Encounter for general adult medical examination without abnormal findings: Secondary | ICD-10-CM | POA: Diagnosis not present

## 2024-08-30 DIAGNOSIS — E785 Hyperlipidemia, unspecified: Secondary | ICD-10-CM | POA: Diagnosis not present

## 2024-08-30 DIAGNOSIS — E559 Vitamin D deficiency, unspecified: Secondary | ICD-10-CM | POA: Diagnosis not present

## 2024-08-30 DIAGNOSIS — G3109 Other frontotemporal dementia: Secondary | ICD-10-CM | POA: Diagnosis not present

## 2024-08-30 DIAGNOSIS — Z23 Encounter for immunization: Secondary | ICD-10-CM | POA: Diagnosis not present

## 2024-08-30 DIAGNOSIS — F0280 Dementia in other diseases classified elsewhere without behavioral disturbance: Secondary | ICD-10-CM | POA: Diagnosis not present

## 2024-08-30 DIAGNOSIS — I10 Essential (primary) hypertension: Secondary | ICD-10-CM

## 2024-08-30 LAB — CBC WITH DIFFERENTIAL/PLATELET
Basophils Absolute: 0 10*3/uL (ref 0.0–0.1)
Basophils Relative: 0.5 % (ref 0.0–3.0)
Eosinophils Absolute: 0.2 10*3/uL (ref 0.0–0.7)
Eosinophils Relative: 3.1 % (ref 0.0–5.0)
HCT: 44 % (ref 36.0–46.0)
Hemoglobin: 14.2 g/dL (ref 12.0–15.0)
Lymphocytes Relative: 25.6 % (ref 12.0–46.0)
Lymphs Abs: 1.4 10*3/uL (ref 0.7–4.0)
MCHC: 32.2 g/dL (ref 30.0–36.0)
MCV: 87.9 fl (ref 78.0–100.0)
Monocytes Absolute: 0.5 10*3/uL (ref 0.1–1.0)
Monocytes Relative: 9.3 % (ref 3.0–12.0)
Neutro Abs: 3.4 10*3/uL (ref 1.4–7.7)
Neutrophils Relative %: 61.5 % (ref 43.0–77.0)
Platelets: 224 10*3/uL (ref 150.0–400.0)
RBC: 5 Mil/uL (ref 3.87–5.11)
RDW: 13.6 % (ref 11.5–15.5)
WBC: 5.5 10*3/uL (ref 4.0–10.5)

## 2024-08-30 LAB — COMPREHENSIVE METABOLIC PANEL WITH GFR
ALT: 17 U/L (ref 3–35)
AST: 26 U/L (ref 5–37)
Albumin: 4.5 g/dL (ref 3.5–5.2)
Alkaline Phosphatase: 94 U/L (ref 39–117)
BUN: 15 mg/dL (ref 6–23)
CO2: 30 meq/L (ref 19–32)
Calcium: 9.4 mg/dL (ref 8.4–10.5)
Chloride: 104 meq/L (ref 96–112)
Creatinine, Ser: 0.91 mg/dL (ref 0.40–1.20)
GFR: 66.73 mL/min
Glucose, Bld: 87 mg/dL (ref 70–99)
Potassium: 3.6 meq/L (ref 3.5–5.1)
Sodium: 141 meq/L (ref 135–145)
Total Bilirubin: 0.5 mg/dL (ref 0.2–1.2)
Total Protein: 7.5 g/dL (ref 6.0–8.3)

## 2024-08-30 LAB — LIPID PANEL
Cholesterol: 239 mg/dL — ABNORMAL HIGH (ref 28–200)
HDL: 56.5 mg/dL
LDL Cholesterol: 169 mg/dL — ABNORMAL HIGH (ref 10–99)
NonHDL: 182.13
Total CHOL/HDL Ratio: 4
Triglycerides: 65 mg/dL (ref 10.0–149.0)
VLDL: 13 mg/dL (ref 0.0–40.0)

## 2024-08-30 LAB — HEMOGLOBIN A1C: Hgb A1c MFr Bld: 5.9 % (ref 4.6–6.5)

## 2024-08-30 NOTE — Progress Notes (Signed)
 "    Established Patient Office Visit     CC/Reason for Visit: Annual preventive exam  HPI: Cheryl [name] is a 64 y.o. female who is coming in today for the above mentioned reasons. Past Medical History is significant for: Hypertension, hyperlipidemia, advanced frontal lobe dementia followed by neurology.  Here with husband and caregiver today.  It is becoming more difficult to ensure her hygiene.  They had to cut her hair short because she would not allow them to wash it.  She does not take medications.  Not even when they tried to hide it amongst her food.  She is due for pneumonia and flu vaccines.  They are requesting a child psychotherapist.   Past Medical/Surgical History: Past Medical History:  Diagnosis Date   Arthritis    Bursitis of left hip    Dysphagia    Esophagitis    Fibromuscular dysplasia    GERD (gastroesophageal reflux disease)    Hemorrhoids    Hyperlipemia    Hypertension    Iron deficiency anemia    Personal history of colonic polyps    Adenomatous    Stroke (HCC)    TIA (transient ischemic attack)     Past Surgical History:  Procedure Laterality Date   CESAREAN SECTION     x 2    NASOSEPTOPLASTY     TEMPOROMANDIBULAR JOINT SURGERY  1989    Social History:  reports that she has never smoked. She has never used smokeless tobacco. She reports that she does not drink alcohol and does not use drugs.  Allergies: Allergies[1]  Family History:  Family History  Problem Relation Age of Onset   Prostate cancer Father    Cancer Father        Laryngeal   Hypertension Mother    Hyperlipidemia Mother    Other Mother        varicose veins   Stroke Mother    Stroke Maternal Grandmother    Diabetes Maternal Aunt    Colon cancer Neg Hx     Current Medications[2]  Review of Systems:  Negative unless indicated in HPI.   Physical Exam: Vitals:   08/30/24 1315  BP: 130/84  Pulse: 94  Temp: 98 F (36.7 C)  TempSrc: Oral  SpO2: 98%  Weight: 107 lb  11.2 oz (48.9 kg)  Height: 5' 1 (1.549 m)    Body mass index is 20.35 kg/m.   Physical Exam Vitals reviewed.  Constitutional:      General: She is not in acute distress.    Appearance: Normal appearance. She is not ill-appearing, toxic-appearing or diaphoretic.  HENT:     Head: Normocephalic.     Right Ear: Tympanic membrane, ear canal and external ear normal. There is no impacted cerumen.     Left Ear: Tympanic membrane, ear canal and external ear normal. There is no impacted cerumen.     Nose: Nose normal.     Mouth/Throat:     Mouth: Mucous membranes are moist.     Pharynx: Oropharynx is clear. No oropharyngeal exudate or posterior oropharyngeal erythema.  Eyes:     General: No scleral icterus.       Right eye: No discharge.        Left eye: No discharge.     Conjunctiva/sclera: Conjunctivae normal.     Pupils: Pupils are equal, round, and reactive to light.  Neck:     Vascular: No carotid bruit.  Cardiovascular:     Rate and Rhythm: Normal  rate and regular rhythm.     Pulses: Normal pulses.     Heart sounds: Normal heart sounds.  Pulmonary:     Effort: Pulmonary effort is normal. No respiratory distress.     Breath sounds: Normal breath sounds.  Abdominal:     General: Abdomen is flat. Bowel sounds are normal.     Palpations: Abdomen is soft.  Musculoskeletal:        General: Normal range of motion.     Cervical back: Normal range of motion.  Skin:    General: Skin is warm and dry.  Neurological:     General: No focal deficit present.     Mental Status: She is alert. Mental status is at baseline.      Impression and Plan:  Vitamin D  deficiency -     VITAMIN D  25 Hydroxy (Vit-D Deficiency, Fractures); Future  IGT (impaired glucose tolerance) -     Hemoglobin A1c; Future  Essential hypertension -     CBC with Differential/Platelet; Future -     Comprehensive metabolic panel with GFR; Future  Dyslipidemia -     Lipid panel; Future  Encounter for  preventive health examination  Frontal lobe dementia (HCC) -     TSH; Future -     Vitamin B12; Future -     Ambulatory referral to Social Work  Immunization due   -Recommend routine eye and dental care. -Healthy lifestyle discussed in detail. -Labs to be updated today. -Prostate cancer screening: N/A Health Maintenance  Topic Date Due   Pneumococcal Vaccine for age over 73 (1 of 1 - PCV) Never done   Colon Cancer Screening  09/15/2014   COVID-19 Vaccine (3 - Moderna risk series) 08/12/2020   Breast Cancer Screening  01/17/2023   Pap with HPV screening  01/17/2024   Flu Shot  03/03/2024   DTaP/Tdap/Td vaccine (2 - Td or Tdap) 11/02/2030   HPV Vaccine (No Doses Required) Completed   Hepatitis C Screening  Completed   HIV Screening  Completed   Zoster (Shingles) Vaccine  Completed   Hepatitis B Vaccine  Aged Out   Meningitis B Vaccine  Aged Out     - Flu and PCV 20 in office today. - I will see if we can have a social worker reach out to them for caregiver management resources.     Tully Theophilus Andrews, MD [facility]      [1]  Allergies Allergen Reactions   Cefazolin Hives and Itching   Sulfonamide Derivatives Hives and Itching  [2]  Current Outpatient Medications:    amLODipine  (NORVASC ) 5 MG tablet, Take 1 tablet (5 mg total) by mouth daily., Disp: 90 tablet, Rfl: 1   divalproex  (DEPAKOTE ) 125 MG DR tablet, Take 1 tablet (125 mg total) by mouth at bedtime., Disp: 90 tablet, Rfl: 3   aspirin  EC 81 MG tablet, Take 1 tablet (81 mg total) by mouth daily. Swallow whole. (Patient not taking: Reported on 08/30/2024), Disp: 30 tablet, Rfl: 11   atorvastatin  (LIPITOR) 80 MG tablet, Take 1 tablet (80 mg total) by mouth at bedtime. (Patient not taking: Reported on 08/30/2024), Disp: 90 tablet, Rfl: 1   memantine  (NAMENDA ) 10 MG tablet, Take 1 tablet (10 mg at night) for 2 weeks, then increase to 1 tablet (10 mg) twice a day (Patient not taking:  Reported on 08/30/2024), Disp: 60 tablet, Rfl: 11 No current facility-administered medications for this visit.  Facility-Administered Medications Ordered in Other Visits:  gadopentetate dimeglumine  (MAGNEVIST ) injection 9 mL, 9 mL, Intravenous, Once PRN, Sethi, Pramod S, MD  "

## 2024-08-30 NOTE — Addendum Note (Signed)
 Addended by: KATHRYNE MILLMAN B on: 08/30/2024 03:13 PM   Modules accepted: Orders

## 2024-08-31 ENCOUNTER — Telehealth: Payer: Self-pay

## 2024-08-31 ENCOUNTER — Ambulatory Visit: Payer: Self-pay | Admitting: Internal Medicine

## 2024-08-31 ENCOUNTER — Other Ambulatory Visit

## 2024-08-31 DIAGNOSIS — E785 Hyperlipidemia, unspecified: Secondary | ICD-10-CM

## 2024-08-31 LAB — VITAMIN B12: Vitamin B-12: 260 pg/mL (ref 211–911)

## 2024-08-31 LAB — VITAMIN D 25 HYDROXY (VIT D DEFICIENCY, FRACTURES): VITD: 16.83 ng/mL — ABNORMAL LOW (ref 30.00–100.00)

## 2024-08-31 LAB — TSH: TSH: 0.9 u[IU]/mL (ref 0.35–5.50)

## 2024-08-31 NOTE — Progress Notes (Signed)
 Complex Care Management Note  Care Guide Note 08/31/2024 Name: Cheryl [name] MRN: [id number] DOB: 1961/05/02  Cheryl [name] is a 64 y.o. year old female who sees Theophilus Andrews, Tully GRADE, MD for primary care. I reached out to Josepha Repetto by phone today to offer complex care management services.  [name] was given information about Complex Care Management services today including:   The Complex Care Management services include support from the care team which includes your Nurse Care Manager, Clinical Social Worker, or Pharmacist.  The Complex Care Management team is here to help remove barriers to the health concerns and goals most important to you. Complex Care Management services are voluntary, and the patient may decline or stop services at any time by request to their care team member.   Complex Care Management Consent Status: Patient agreed to services and verbal consent obtained.   Follow up plan:  Telephone appointment with complex care management team member scheduled for:  09/15/2024  Encounter Outcome:  Patient Scheduled  Doyce Razor [facility]  [facility], [facility] Guide Direct Dial: [phone]  Fax: [phone]

## 2024-09-05 MED ORDER — VITAMIN D (ERGOCALCIFEROL) 1.25 MG (50000 UNIT) PO CAPS: 50000.0000 [IU] | ORAL_CAPSULE | ORAL | 0 refills | Status: AC

## 2024-09-05 MED ORDER — ATORVASTATIN CALCIUM 80 MG PO TABS: 80.0000 mg | ORAL_TABLET | Freq: Every day | ORAL | 1 refills | Status: AC

## 2024-09-15 ENCOUNTER — Telehealth: Admitting: *Deleted

## 2024-09-27 ENCOUNTER — Ambulatory Visit: Admitting: Physician Assistant

## 2025-09-04 ENCOUNTER — Encounter: Admitting: Internal Medicine
